# Patient Record
Sex: Female | Born: 1980 | Race: Black or African American | Hispanic: No | Marital: Married | Smoking: Never smoker
Health system: Southern US, Community
[De-identification: ages and names within clinical notes are randomized; demographics above are authoritative.]

## PROBLEM LIST (undated history)

## (undated) ENCOUNTER — Inpatient Hospital Stay (HOSPITAL_COMMUNITY): Payer: Self-pay

## (undated) ENCOUNTER — Emergency Department (HOSPITAL_COMMUNITY): Admission: EM | Payer: BC Managed Care – PPO | Source: Home / Self Care

## (undated) DIAGNOSIS — K219 Gastro-esophageal reflux disease without esophagitis: Secondary | ICD-10-CM

## (undated) DIAGNOSIS — O24419 Gestational diabetes mellitus in pregnancy, unspecified control: Secondary | ICD-10-CM

## (undated) DIAGNOSIS — M1990 Unspecified osteoarthritis, unspecified site: Secondary | ICD-10-CM

## (undated) DIAGNOSIS — IMO0002 Reserved for concepts with insufficient information to code with codable children: Secondary | ICD-10-CM

## (undated) DIAGNOSIS — D649 Anemia, unspecified: Secondary | ICD-10-CM

## (undated) HISTORY — DX: Gestational diabetes mellitus in pregnancy, unspecified control: O24.419

## (undated) HISTORY — DX: Anemia, unspecified: D64.9

## (undated) HISTORY — DX: Gastro-esophageal reflux disease without esophagitis: K21.9

## (undated) HISTORY — DX: Unspecified osteoarthritis, unspecified site: M19.90

---

## 2011-10-22 ENCOUNTER — Inpatient Hospital Stay (HOSPITAL_COMMUNITY)
Admission: AD | Admit: 2011-10-22 | Discharge: 2011-10-22 | Disposition: A | Discharge status: Home / Self Care | Payer: Medicaid Other | Source: Ambulatory Visit | Attending: Obstetrics & Gynecology | Admitting: Obstetrics & Gynecology

## 2011-10-22 ENCOUNTER — Inpatient Hospital Stay (HOSPITAL_COMMUNITY): Payer: Medicaid Other

## 2011-10-22 ENCOUNTER — Encounter (HOSPITAL_COMMUNITY): Payer: Self-pay | Admitting: *Deleted

## 2011-10-22 DIAGNOSIS — O99891 Other specified diseases and conditions complicating pregnancy: Secondary | ICD-10-CM | POA: Insufficient documentation

## 2011-10-22 DIAGNOSIS — R102 Pelvic and perineal pain: Secondary | ICD-10-CM

## 2011-10-22 DIAGNOSIS — R109 Unspecified abdominal pain: Secondary | ICD-10-CM | POA: Insufficient documentation

## 2011-10-22 DIAGNOSIS — O26899 Other specified pregnancy related conditions, unspecified trimester: Secondary | ICD-10-CM

## 2011-10-22 DIAGNOSIS — N949 Unspecified condition associated with female genital organs and menstrual cycle: Secondary | ICD-10-CM | POA: Insufficient documentation

## 2011-10-22 HISTORY — DX: Reserved for concepts with insufficient information to code with codable children: IMO0002

## 2011-10-22 LAB — URINALYSIS, ROUTINE W REFLEX MICROSCOPIC
Glucose, UA: NEGATIVE mg/dL
Hgb urine dipstick: NEGATIVE
Ketones, ur: NEGATIVE mg/dL
Protein, ur: NEGATIVE mg/dL

## 2011-10-22 LAB — WET PREP, GENITAL
Clue Cells Wet Prep HPF POC: NONE SEEN
Yeast Wet Prep HPF POC: NONE SEEN

## 2011-10-22 NOTE — Progress Notes (Signed)
?   Also can not eat- has an ulcer

## 2011-10-22 NOTE — ED Provider Notes (Signed)
History     Chief Complaint  Patient presents with  . Abdominal Pain   HPI  Pt (via interpreter) reports feeling lower pelvic pain since onset of pregnancy, however, pain felt like contractions starting this morning and was worsening; at this time pain has improved.  Pt states she had constipation, no BM for 10 days until yesterday.   Past Medical History  Diagnosis Date  . Ulcer   . Asthma     Ventolin    Past Surgical History  Procedure Date  . Cesarean section     No family history on file.  History  Substance Use Topics  . Smoking status: Never Smoker   . Smokeless tobacco: Not on file  . Alcohol Use: No    Allergies: No Known Allergies  No prescriptions prior to admission    Review of Systems  Gastrointestinal: Positive for abdominal pain.  All other systems reviewed and are negative.   Physical Exam   Blood pressure 119/55, pulse 86, temperature 99.2 F (37.3 C), temperature source Oral, resp. rate 20, height 5' 3.5" (1.613 m), weight 61.417 kg (135 lb 6.4 oz), last menstrual period 07/07/2011, unknown if currently breastfeeding.  Physical Exam  Constitutional: She is oriented to person, place, and time. She appears well-developed and well-nourished.  HENT:  Head: Normocephalic.  Neck: Normal range of motion. Neck supple.  Cardiovascular: Normal rate, regular rhythm and normal heart sounds.   Respiratory: Effort normal and breath sounds normal.  GI: Soft. There is no tenderness. There is no rebound and no guarding.       Fundal height - at umbilicus  Genitourinary: No bleeding around the vagina. Vaginal discharge (mucusy) found.       Cervix palpates short - approx 0.5 cm  Neurological: She is alert and oriented to person, place, and time.  Skin: Skin is warm and dry.    MAU Course  Procedures  Wet prep - negative GC/CT - pend UA - negative OB US - IUP of 15.4 wks; no abnormalities; cervical length 4.76  Assessment and Plan  Pelvic Pain in  Pregnancy  Plan: Keep appointment with [facility] F/U for bleeding or increasing pelvic pain  [facility] 10/22/2011, 9:25 AM

## 2011-10-22 NOTE — Progress Notes (Signed)
Pt states she feels like she is having contractions, was worse earlier this morning, feels better now.  Pt states she has been feeling this pain since beginning of pregnancy.  Pt states she has constipation, no BM for 10 days until yesterday.

## 2011-10-22 NOTE — Progress Notes (Signed)
abd and back pain.- started with preg. Has not been seen anywhere with preg.  11/27 appt at [facility]

## 2011-10-23 LAB — GC/CHLAMYDIA PROBE AMP, GENITAL: GC Probe Amp, Genital: NEGATIVE

## 2011-12-21 HISTORY — DX: Maternal care for unspecified type scar from previous cesarean delivery: O34.219

## 2012-01-03 ENCOUNTER — Inpatient Hospital Stay (HOSPITAL_COMMUNITY)
Admission: AD | Admit: 2012-01-03 | Discharge: 2012-01-03 | Disposition: A | Discharge status: Home / Self Care | Payer: Medicaid Other | Source: Ambulatory Visit | Attending: Obstetrics | Admitting: Obstetrics

## 2012-01-03 ENCOUNTER — Encounter (HOSPITAL_COMMUNITY): Payer: Self-pay | Admitting: *Deleted

## 2012-01-03 DIAGNOSIS — O99891 Other specified diseases and conditions complicating pregnancy: Secondary | ICD-10-CM | POA: Insufficient documentation

## 2012-01-03 DIAGNOSIS — R109 Unspecified abdominal pain: Secondary | ICD-10-CM | POA: Insufficient documentation

## 2012-01-03 DIAGNOSIS — M549 Dorsalgia, unspecified: Secondary | ICD-10-CM

## 2012-01-03 DIAGNOSIS — N949 Unspecified condition associated with female genital organs and menstrual cycle: Secondary | ICD-10-CM | POA: Insufficient documentation

## 2012-01-03 LAB — URINALYSIS, ROUTINE W REFLEX MICROSCOPIC
Hgb urine dipstick: NEGATIVE
Ketones, ur: 15 mg/dL — AB
Protein, ur: NEGATIVE mg/dL
Urobilinogen, UA: 0.2 mg/dL (ref 0.0–1.0)

## 2012-01-03 LAB — URINE CULTURE: Colony Count: >=100000

## 2012-01-03 MED ORDER — CYCLOBENZAPRINE HCL 5 MG PO TABS: 5.0000 mg | ORAL_TABLET | Freq: Three times a day (TID) | ORAL | Status: DC | PRN

## 2012-01-03 MED ORDER — CYCLOBENZAPRINE HCL 5 MG PO TABS: 5.0000 mg | ORAL_TABLET | Freq: Three times a day (TID) | ORAL | Status: AC | PRN

## 2012-01-03 NOTE — ED Provider Notes (Signed)
History   Patient is a 31 year old G20P1001 female who is [redacted]w[redacted]d and presents with chief complaint of pelvic pain, right leg pain, and right flank pain.  The pain began 1 day ago at 7pm.  The patient states she was on her feet doing housework from 8am-8pm 1 day ago.  She has taken no medications for pain relief but says her pain improved following a warm bath this morning. Denies nausea, vomiting, dysuria, hematuria, and blood in her stool.  Denies chest pain and shortness of breath.  Reports diarrhea yesterday afternoon.     Chief Complaint  Patient presents with  . Pelvic Pain   Pelvic Pain Associated symptoms include abdominal pain. Pertinent negatives include no chills, fever, nausea or vomiting.    OB History    Grav Para Term Preterm Abortions TAB SAB Ect Mult Living   2 1 1       1       Past Medical History  Diagnosis Date  . Ulcer   . Asthma     Ventolin    Past Surgical History  Procedure Date  . Cesarean section     No family history on file.  History  Substance Use Topics  . Smoking status: Never Smoker   . Smokeless tobacco: Not on file  . Alcohol Use: No    Allergies: No Known Allergies  Prescriptions prior to admission  Medication Sig Dispense Refill  . OVER THE COUNTER MEDICATION Patient is french and states she takes a medication for ulcers       . OVER THE COUNTER MEDICATION Patient is french and states she takes a medication for constipation       . prenatal vitamin w/FE, FA (PRENATAL 1 + 1) 27-1 MG TABS Take 1 tablet by mouth daily.          Review of Systems  Constitutional: Negative for fever and chills.  Eyes: Negative for blurred vision.  Respiratory: Negative for shortness of breath.   Cardiovascular: Negative for chest pain.  Gastrointestinal: Positive for abdominal pain. Negative for nausea, vomiting and blood in stool.  Musculoskeletal: Negative for falls.   Physical Exam   Blood pressure 109/62, pulse 96, temperature 97.4 F (36.3  C), temperature source Oral, resp. rate 18, last menstrual period 07/07/2011, SpO2 97.00%.  Physical Exam  Constitutional: She is oriented to person, place, and time. She appears well-developed and well-nourished.  HENT:  Head: Normocephalic.  Neck: Normal range of motion.  Cardiovascular: Normal rate, regular rhythm, normal heart sounds and intact distal pulses.  Exam reveals no gallop and no friction rub.   No murmur heard. Respiratory: Effort normal. No respiratory distress. She has no wheezes. She has no rales.  GI: There is tenderness (right lower quadrant).  Neurological: She is alert and oriented to person, place, and time.  Skin: Skin is warm and dry.  Psychiatric: She has a normal mood and affect. Her behavior is normal.  Genitourinary: cervix is long, thick, and high.    MAU Course: Consult with Dr. Clearance Coots  Procedures Urinalysis Urine culture Cervical exam  Assessment and Plan  Assessment 1) Musculoskeletal back pain related to physical activity 2) Round ligament pain  Plan 1) Encouraged rest, warm bath/compress, and Tylenol as needed for musculoskeletal back pain.  Prescribed Flexeril 5 mg three times per day as needed for back pain.   2) Round ligament pain educational materials given to and discussed with patient.   3) Discharge home with instructions to return  to care as needed and to follow up with Dr. Clearance Coots per her next scheduled appointment.    Jhett Fretwell 01/03/2012, 10:41 AM

## 2012-01-03 NOTE — Progress Notes (Signed)
Pt C/O pelvic pain during the night, continues this a.m.  Denies bleeding or LOF.

## 2012-01-07 NOTE — ED Provider Notes (Signed)
I have seen this patient and agree with the student's note.   Sharen Counter, CNM

## 2012-04-05 ENCOUNTER — Other Ambulatory Visit: Payer: Self-pay | Admitting: Obstetrics & Gynecology

## 2012-04-05 ENCOUNTER — Encounter (HOSPITAL_COMMUNITY): Payer: Self-pay | Admitting: Pharmacist

## 2012-04-06 ENCOUNTER — Inpatient Hospital Stay (HOSPITAL_COMMUNITY): Payer: Medicaid Other

## 2012-04-06 ENCOUNTER — Encounter (HOSPITAL_COMMUNITY): Payer: Self-pay | Admitting: *Deleted

## 2012-04-06 ENCOUNTER — Encounter (HOSPITAL_COMMUNITY): Payer: Self-pay | Admitting: Anesthesiology

## 2012-04-06 ENCOUNTER — Encounter (HOSPITAL_COMMUNITY)
Admission: RE | Disposition: A | Discharge status: Home / Self Care | Payer: Self-pay | Source: Ambulatory Visit | Attending: Obstetrics & Gynecology

## 2012-04-06 ENCOUNTER — Encounter (HOSPITAL_COMMUNITY): Payer: Self-pay | Admitting: Obstetrics & Gynecology

## 2012-04-06 ENCOUNTER — Inpatient Hospital Stay (HOSPITAL_COMMUNITY)
Admission: RE | Admit: 2012-04-06 | Discharge: 2012-04-09 | DRG: 765 | Disposition: A | Discharge status: Home / Self Care | Payer: Medicaid Other | Source: Ambulatory Visit | Attending: Obstetrics & Gynecology | Admitting: Obstetrics & Gynecology

## 2012-04-06 ENCOUNTER — Encounter (HOSPITAL_COMMUNITY): Payer: Self-pay

## 2012-04-06 DIAGNOSIS — D62 Acute posthemorrhagic anemia: Secondary | ICD-10-CM

## 2012-04-06 DIAGNOSIS — D649 Anemia, unspecified: Secondary | ICD-10-CM | POA: Diagnosis not present

## 2012-04-06 DIAGNOSIS — O409XX0 Polyhydramnios, unspecified trimester, not applicable or unspecified: Secondary | ICD-10-CM | POA: Diagnosis present

## 2012-04-06 DIAGNOSIS — O99893 Other specified diseases and conditions complicating puerperium: Secondary | ICD-10-CM | POA: Diagnosis not present

## 2012-04-06 DIAGNOSIS — Z98891 History of uterine scar from previous surgery: Secondary | ICD-10-CM

## 2012-04-06 DIAGNOSIS — O9903 Anemia complicating the puerperium: Secondary | ICD-10-CM | POA: Diagnosis not present

## 2012-04-06 DIAGNOSIS — O34219 Maternal care for unspecified type scar from previous cesarean delivery: Secondary | ICD-10-CM

## 2012-04-06 DIAGNOSIS — R0602 Shortness of breath: Secondary | ICD-10-CM | POA: Diagnosis not present

## 2012-04-06 LAB — CBC
MCH: 20.9 pg — ABNORMAL LOW (ref 26.0–34.0)
MCV: 67 fL — ABNORMAL LOW (ref 78.0–100.0)
Platelets: 209 10*3/uL (ref 150–400)
RBC: 4.88 MIL/uL (ref 3.87–5.11)
RDW: 16.9 % — ABNORMAL HIGH (ref 11.5–15.5)

## 2012-04-06 LAB — SURGICAL PCR SCREEN
MRSA, PCR: NEGATIVE
Staphylococcus aureus: NEGATIVE

## 2012-04-06 SURGERY — Surgical Case
Anesthesia: Spinal | Site: Abdomen | Wound class: Clean Contaminated

## 2012-04-06 MED ORDER — ONDANSETRON HCL 4 MG/2ML IJ SOLN
INTRAMUSCULAR | Status: AC
  Filled 2012-04-06: qty 2

## 2012-04-06 MED ORDER — ONDANSETRON HCL 4 MG/2ML IJ SOLN: 4.0000 mg | Freq: Three times a day (TID) | INTRAMUSCULAR | Status: DC | PRN

## 2012-04-06 MED ORDER — TETANUS-DIPHTH-ACELL PERTUSSIS 5-2.5-18.5 LF-MCG/0.5 IM SUSP
0.5000 mL | Freq: Once | INTRAMUSCULAR | Status: AC
  Administered 2012-04-07: 0.5 mL via INTRAMUSCULAR
  Filled 2012-04-06: qty 0.5

## 2012-04-06 MED ORDER — MUPIROCIN 2 % EX OINT
TOPICAL_OINTMENT | CUTANEOUS | Status: AC
  Administered 2012-04-06: 1 via NASAL
  Filled 2012-04-06: qty 22

## 2012-04-06 MED ORDER — CEFAZOLIN SODIUM-DEXTROSE 2-3 GM-% IV SOLR
2.0000 g | INTRAVENOUS | Status: DC
  Filled 2012-04-06: qty 50

## 2012-04-06 MED ORDER — EPHEDRINE 5 MG/ML INJ
INTRAVENOUS | Status: AC
  Filled 2012-04-06: qty 10

## 2012-04-06 MED ORDER — SENNOSIDES-DOCUSATE SODIUM 8.6-50 MG PO TABS
2.0000 | ORAL_TABLET | Freq: Every day | ORAL | Status: DC
  Administered 2012-04-07 – 2012-04-08 (×2): 2 via ORAL

## 2012-04-06 MED ORDER — HYDROMORPHONE HCL PF 1 MG/ML IJ SOLN
INTRAMUSCULAR | Status: AC
  Administered 2012-04-06: 0.5 mg via INTRAVENOUS
  Filled 2012-04-06: qty 1

## 2012-04-06 MED ORDER — ONDANSETRON HCL 4 MG PO TABS: 4.0000 mg | ORAL_TABLET | ORAL | Status: DC | PRN

## 2012-04-06 MED ORDER — LANOLIN HYDROUS EX OINT: 1.0000 "application " | TOPICAL_OINTMENT | CUTANEOUS | Status: DC | PRN

## 2012-04-06 MED ORDER — SODIUM CHLORIDE 0.9 % IJ SOLN: 3.0000 mL | INTRAMUSCULAR | Status: DC | PRN

## 2012-04-06 MED ORDER — DIBUCAINE 1 % RE OINT: 1.0000 "application " | TOPICAL_OINTMENT | RECTAL | Status: DC | PRN

## 2012-04-06 MED ORDER — KETOROLAC TROMETHAMINE 60 MG/2ML IM SOLN
60.0000 mg | Freq: Once | INTRAMUSCULAR | Status: AC | PRN
  Administered 2012-04-06: 60 mg via INTRAMUSCULAR

## 2012-04-06 MED ORDER — MEDROXYPROGESTERONE ACETATE 150 MG/ML IM SUSP: 150.0000 mg | INTRAMUSCULAR | Status: DC | PRN

## 2012-04-06 MED ORDER — NALBUPHINE HCL 10 MG/ML IJ SOLN: 5.0000 mg | INTRAMUSCULAR | Status: DC | PRN

## 2012-04-06 MED ORDER — FENTANYL CITRATE 0.05 MG/ML IJ SOLN
INTRAMUSCULAR | Status: DC | PRN
  Administered 2012-04-06: 87.5 ug via INTRAVENOUS
  Administered 2012-04-06: 12.5 ug via INTRATHECAL

## 2012-04-06 MED ORDER — OXYTOCIN 20 UNITS IN LACTATED RINGERS INFUSION - SIMPLE: 125.0000 mL/h | INTRAVENOUS | Status: AC

## 2012-04-06 MED ORDER — FENTANYL CITRATE 0.05 MG/ML IJ SOLN
INTRAMUSCULAR | Status: AC
  Filled 2012-04-06: qty 2

## 2012-04-06 MED ORDER — ONDANSETRON HCL 4 MG/2ML IJ SOLN: 4.0000 mg | INTRAMUSCULAR | Status: DC | PRN

## 2012-04-06 MED ORDER — TRIAMCINOLONE ACETONIDE 40 MG/ML IJ SUSP
40.0000 mg | Freq: Once | INTRAMUSCULAR | Status: AC
  Administered 2012-04-06: 40 mg via INTRAMUSCULAR
  Filled 2012-04-06: qty 1

## 2012-04-06 MED ORDER — ONDANSETRON HCL 4 MG/2ML IJ SOLN
INTRAMUSCULAR | Status: DC | PRN
  Administered 2012-04-06: 4 mg via INTRAVENOUS

## 2012-04-06 MED ORDER — MEPERIDINE HCL 25 MG/ML IJ SOLN: 6.2500 mg | INTRAMUSCULAR | Status: DC | PRN

## 2012-04-06 MED ORDER — DIPHENHYDRAMINE HCL 50 MG/ML IJ SOLN: 25.0000 mg | INTRAMUSCULAR | Status: DC | PRN

## 2012-04-06 MED ORDER — OXYCODONE-ACETAMINOPHEN 5-325 MG PO TABS
1.0000 | ORAL_TABLET | ORAL | Status: DC | PRN
  Administered 2012-04-08: 1 via ORAL
  Filled 2012-04-06: qty 1

## 2012-04-06 MED ORDER — BUPIVACAINE HCL (PF) 0.25 % IJ SOLN
INTRAMUSCULAR | Status: DC | PRN
  Administered 2012-04-06: 30 mL

## 2012-04-06 MED ORDER — SCOPOLAMINE 1 MG/3DAYS TD PT72
MEDICATED_PATCH | TRANSDERMAL | Status: AC
  Administered 2012-04-06: 1.5 mg via TRANSDERMAL
  Filled 2012-04-06: qty 1

## 2012-04-06 MED ORDER — DIPHENHYDRAMINE HCL 25 MG PO CAPS: 25.0000 mg | ORAL_CAPSULE | ORAL | Status: DC | PRN

## 2012-04-06 MED ORDER — MORPHINE SULFATE 0.5 MG/ML IJ SOLN
INTRAMUSCULAR | Status: AC
  Filled 2012-04-06: qty 10

## 2012-04-06 MED ORDER — WITCH HAZEL-GLYCERIN EX PADS: 1.0000 "application " | MEDICATED_PAD | CUTANEOUS | Status: DC | PRN

## 2012-04-06 MED ORDER — NALOXONE HCL 0.4 MG/ML IJ SOLN: 1.0000 ug/kg/h | INTRAMUSCULAR | Status: DC | PRN

## 2012-04-06 MED ORDER — ZOLPIDEM TARTRATE 5 MG PO TABS: 5.0000 mg | ORAL_TABLET | Freq: Every evening | ORAL | Status: DC | PRN

## 2012-04-06 MED ORDER — SIMETHICONE 80 MG PO CHEW: 80.0000 mg | CHEWABLE_TABLET | ORAL | Status: DC | PRN

## 2012-04-06 MED ORDER — SIMETHICONE 80 MG PO CHEW
80.0000 mg | CHEWABLE_TABLET | Freq: Three times a day (TID) | ORAL | Status: DC
  Administered 2012-04-07 – 2012-04-09 (×7): 80 mg via ORAL

## 2012-04-06 MED ORDER — KETOROLAC TROMETHAMINE 30 MG/ML IJ SOLN: 30.0000 mg | Freq: Four times a day (QID) | INTRAMUSCULAR | Status: AC | PRN

## 2012-04-06 MED ORDER — DIPHENHYDRAMINE HCL 25 MG PO CAPS: 25.0000 mg | ORAL_CAPSULE | Freq: Four times a day (QID) | ORAL | Status: DC | PRN

## 2012-04-06 MED ORDER — HYDROMORPHONE HCL PF 1 MG/ML IJ SOLN
0.2500 mg | INTRAMUSCULAR | Status: DC | PRN
  Administered 2012-04-06 (×3): 0.5 mg via INTRAVENOUS

## 2012-04-06 MED ORDER — PHENYLEPHRINE 40 MCG/ML (10ML) SYRINGE FOR IV PUSH (FOR BLOOD PRESSURE SUPPORT)
PREFILLED_SYRINGE | INTRAVENOUS | Status: AC
  Filled 2012-04-06: qty 10

## 2012-04-06 MED ORDER — MENTHOL 3 MG MT LOZG: 1.0000 | LOZENGE | OROMUCOSAL | Status: DC | PRN

## 2012-04-06 MED ORDER — MUPIROCIN 2 % EX OINT
TOPICAL_OINTMENT | Freq: Two times a day (BID) | CUTANEOUS | Status: DC
  Administered 2012-04-06: 1 via NASAL

## 2012-04-06 MED ORDER — LACTATED RINGERS IV SOLN
INTRAVENOUS | Status: DC
  Administered 2012-04-06 – 2012-04-07 (×2): via INTRAVENOUS

## 2012-04-06 MED ORDER — SCOPOLAMINE 1 MG/3DAYS TD PT72
1.0000 | MEDICATED_PATCH | Freq: Once | TRANSDERMAL | Status: DC
  Administered 2012-04-06: 1.5 mg via TRANSDERMAL

## 2012-04-06 MED ORDER — EPHEDRINE SULFATE 50 MG/ML IJ SOLN
INTRAMUSCULAR | Status: DC | PRN
  Administered 2012-04-06: 10 mg via INTRAVENOUS

## 2012-04-06 MED ORDER — BACITRACIN-NEOMYCIN-POLYMYXIN 400-5-5000 EX OINT
TOPICAL_OINTMENT | CUTANEOUS | Status: AC
  Filled 2012-04-06: qty 1

## 2012-04-06 MED ORDER — OXYTOCIN 20 UNITS IN LACTATED RINGERS INFUSION - SIMPLE
INTRAVENOUS | Status: DC | PRN
  Administered 2012-04-06 (×2): 20 [IU] via INTRAVENOUS

## 2012-04-06 MED ORDER — PRENATAL MULTIVITAMIN CH
1.0000 | ORAL_TABLET | Freq: Every day | ORAL | Status: DC
  Administered 2012-04-07 – 2012-04-09 (×3): 1 via ORAL
  Filled 2012-04-06 (×3): qty 1

## 2012-04-06 MED ORDER — KETOROLAC TROMETHAMINE 60 MG/2ML IM SOLN
INTRAMUSCULAR | Status: AC
  Administered 2012-04-06: 60 mg via INTRAMUSCULAR
  Filled 2012-04-06: qty 2

## 2012-04-06 MED ORDER — CEFAZOLIN SODIUM 1-5 GM-% IV SOLN
INTRAVENOUS | Status: DC | PRN
  Administered 2012-04-06: 2 g via INTRAVENOUS

## 2012-04-06 MED ORDER — OXYTOCIN 10 UNIT/ML IJ SOLN
INTRAMUSCULAR | Status: AC
  Filled 2012-04-06: qty 2

## 2012-04-06 MED ORDER — OXYTOCIN 10 UNIT/ML IJ SOLN
INTRAMUSCULAR | Status: AC
  Filled 2012-04-06: qty 1

## 2012-04-06 MED ORDER — DIPHENHYDRAMINE HCL 50 MG/ML IJ SOLN: 12.5000 mg | INTRAMUSCULAR | Status: DC | PRN

## 2012-04-06 MED ORDER — NALOXONE HCL 0.4 MG/ML IJ SOLN: 0.4000 mg | INTRAMUSCULAR | Status: DC | PRN

## 2012-04-06 MED ORDER — LACTATED RINGERS IV SOLN
INTRAVENOUS | Status: DC
  Administered 2012-04-06 (×2): via INTRAVENOUS

## 2012-04-06 MED ORDER — KETOROLAC TROMETHAMINE 30 MG/ML IJ SOLN: 15.0000 mg | Freq: Once | INTRAMUSCULAR | Status: DC | PRN

## 2012-04-06 MED ORDER — MORPHINE SULFATE (PF) 0.5 MG/ML IJ SOLN
INTRAMUSCULAR | Status: DC | PRN
  Administered 2012-04-06: .1 mg via INTRATHECAL

## 2012-04-06 MED ORDER — BUPIVACAINE HCL (PF) 0.25 % IJ SOLN
INTRAMUSCULAR | Status: AC
  Filled 2012-04-06: qty 30

## 2012-04-06 MED ORDER — PROMETHAZINE HCL 25 MG/ML IJ SOLN
6.2500 mg | INTRAMUSCULAR | Status: DC | PRN
Start: 2012-04-06 — End: 2012-04-06

## 2012-04-06 MED ORDER — PHENYLEPHRINE HCL 10 MG/ML IJ SOLN
INTRAMUSCULAR | Status: DC | PRN
  Administered 2012-04-06 (×2): 80 ug via INTRAVENOUS

## 2012-04-06 MED ORDER — IBUPROFEN 600 MG PO TABS
600.0000 mg | ORAL_TABLET | Freq: Four times a day (QID) | ORAL | Status: DC
  Administered 2012-04-07 – 2012-04-09 (×9): 600 mg via ORAL
  Filled 2012-04-06 (×10): qty 1

## 2012-04-06 SURGICAL SUPPLY — 42 items
BENZOIN TINCTURE PRP APPL 2/3 (GAUZE/BANDAGES/DRESSINGS) ×2 IMPLANT
CANISTER WOUND CARE 500ML ATS (WOUND CARE) IMPLANT
CHLORAPREP W/TINT 26ML (MISCELLANEOUS) ×2 IMPLANT
CLOTH BEACON ORANGE TIMEOUT ST (SAFETY) ×2 IMPLANT
CONTAINER PREFILL 10% NBF 15ML (MISCELLANEOUS) IMPLANT
DRESSING TELFA 8X3 (GAUZE/BANDAGES/DRESSINGS) ×2 IMPLANT
DRSG VAC ATS LRG SENSATRAC (GAUZE/BANDAGES/DRESSINGS) IMPLANT
DRSG VAC ATS MED SENSATRAC (GAUZE/BANDAGES/DRESSINGS) IMPLANT
DRSG VAC ATS SM SENSATRAC (GAUZE/BANDAGES/DRESSINGS) IMPLANT
ELECT REM PT RETURN 9FT ADLT (ELECTROSURGICAL) ×2
ELECTRODE REM PT RTRN 9FT ADLT (ELECTROSURGICAL) ×1 IMPLANT
EXTRACTOR VACUUM M CUP 4 TUBE (SUCTIONS) IMPLANT
GAUZE SPONGE 4X4 12PLY STRL LF (GAUZE/BANDAGES/DRESSINGS) ×4 IMPLANT
GLOVE BIO SURGEON STRL SZ 6.5 (GLOVE) ×4 IMPLANT
GOWN PREVENTION PLUS LG XLONG (DISPOSABLE) ×6 IMPLANT
KIT ABG SYR 3ML LUER SLIP (SYRINGE) IMPLANT
NEEDLE HYPO 25X5/8 SAFETYGLIDE (NEEDLE) ×2 IMPLANT
NS IRRIG 1000ML POUR BTL (IV SOLUTION) ×2 IMPLANT
PACK C SECTION WH (CUSTOM PROCEDURE TRAY) ×2 IMPLANT
PAD ABD 7.5X8 STRL (GAUZE/BANDAGES/DRESSINGS) ×2 IMPLANT
RTRCTR C-SECT PINK 25CM LRG (MISCELLANEOUS) IMPLANT
RTRCTR C-SECT PINK 34CM XLRG (MISCELLANEOUS) IMPLANT
SLEEVE SCD COMPRESS KNEE MED (MISCELLANEOUS) IMPLANT
SPONGE GAUZE 4X4 12PLY (GAUZE/BANDAGES/DRESSINGS) ×2 IMPLANT
STAPLER VISISTAT 35W (STAPLE) IMPLANT
STRIP CLOSURE SKIN 1/2X4 (GAUZE/BANDAGES/DRESSINGS) ×2 IMPLANT
SUT MNCRL 0 VIOLET CTX 36 (SUTURE) ×2 IMPLANT
SUT MNCRL AB 3-0 PS2 27 (SUTURE) IMPLANT
SUT MONOCRYL 0 CTX 36 (SUTURE) ×2
SUT PDS AB 0 CTX 36 PDP370T (SUTURE) ×2 IMPLANT
SUT PLAIN 0 NONE (SUTURE) IMPLANT
SUT VIC AB 0 CT1 27 (SUTURE)
SUT VIC AB 0 CT1 27XBRD ANBCTR (SUTURE) IMPLANT
SUT VIC AB 2-0 CT1 (SUTURE) IMPLANT
SUT VIC AB 2-0 CT1 27 (SUTURE) ×1
SUT VIC AB 2-0 CT1 TAPERPNT 27 (SUTURE) ×1 IMPLANT
SUT VIC AB 2-0 SH 27 (SUTURE)
SUT VIC AB 2-0 SH 27XBRD (SUTURE) IMPLANT
TAPE CLOTH SURG 4X10 WHT LF (GAUZE/BANDAGES/DRESSINGS) ×2 IMPLANT
TOWEL OR 17X24 6PK STRL BLUE (TOWEL DISPOSABLE) ×4 IMPLANT
TRAY FOLEY CATH 14FR (SET/KITS/TRAYS/PACK) ×2 IMPLANT
WATER STERILE IRR 1000ML POUR (IV SOLUTION) IMPLANT

## 2012-04-06 NOTE — H&P (Signed)
Teresa [name] is [name] 31 y.o. female presenting for [name] repeat C/D. Maternal Medical History:  Reason for admission: For [name] repeat C/D.  Contractions: Onset was more than 2 days ago.   Frequency: irregular.   Perceived severity is mild.    Fetal activity: Perceived fetal activity is normal.    Prenatal complications: Polyhydramnios.     OB History    Grav Para Term Preterm Abortions TAB SAB Ect Mult Living   2 1 1       1      Past Medical History  Diagnosis Date  . Ulcer   . Asthma     Ventolin   Past Surgical History  Procedure Date  . Cesarean section    Family History: family history is not on file. Social History:  reports that she has never smoked. She does not have any smokeless tobacco history on file. She reports that she does not drink alcohol or use illicit drugs.  Review of Systems  Constitutional: Negative for fever.  Eyes: Negative for blurred vision.  Respiratory: Negative for shortness of breath.   Gastrointestinal: Negative for vomiting.  Skin: Negative for rash.  Neurological: Negative for headaches.      Last menstrual period 07/07/2011. Maternal Exam:  Uterine Assessment: Contraction strength is mild.  Contraction frequency is irregular.   Abdomen: Patient reports no abdominal tenderness. Surgical scars: low transverse.   Introitus: not evaluated.     Fetal Exam Fetal Monitor Review: Variability: moderate (6-25 bpm).   Pattern: no accelerations and no decelerations.    Fetal State Assessment: Category I - tracings are normal.     Physical Exam  Constitutional: She appears well-developed.  HENT:  Head: Normocephalic.  Neck: Neck supple. No thyromegaly present.  Cardiovascular: Normal rate and regular rhythm.   Respiratory: Breath sounds normal.  GI: Soft. Bowel sounds are normal.  Skin: No rash noted.    Prenatal labs: ABO, Rh:   Antibody:   Rubella:   RPR:    HBsAg:    HIV:    GBS:     Assessment/Plan: 31 y.o. P1 w/an IUP  @ [redacted]w[redacted]d.  Polyhydramnios.  H/O previous C/D--undocumented scar.  Repeat C/D   JACKSON-MOORE,Teresa [name] 04/06/2012, 9:35 AM

## 2012-04-06 NOTE — Anesthesia Preprocedure Evaluation (Signed)
Anesthesia Evaluation  Patient identified by MRN, date of birth, ID band Patient awake    Reviewed: Allergy & Precautions, H&P , NPO status , Patient's Chart, lab work & pertinent test results  Airway Mallampati: I TM Distance: >3 FB Neck ROM: full    Dental No notable dental hx.    Pulmonary    Pulmonary exam normal       Cardiovascular negative cardio ROS      Neuro/Psych negative neurological ROS  negative psych ROS   GI/Hepatic negative GI ROS, Neg liver ROS,   Endo/Other  negative endocrine ROS  Renal/GU negative Renal ROS  negative genitourinary   Musculoskeletal negative musculoskeletal ROS (+)   Abdominal Normal abdominal exam  (+)   Peds negative pediatric ROS (+)  Hematology negative hematology ROS (+)   Anesthesia Other Findings   Reproductive/Obstetrics (+) Pregnancy                           Anesthesia Physical Anesthesia Plan  ASA: II  Anesthesia Plan: Spinal   Post-op Pain Management:    Induction:   Airway Management Planned:   Additional Equipment:   Intra-op Plan:   Post-operative Plan:   Informed Consent: I have reviewed the patients History and Physical, chart, labs and discussed the procedure including the risks, benefits and alternatives for the proposed anesthesia with the patient or authorized representative who has indicated his/her understanding and acceptance.     Plan Discussed with: CRNA and Surgeon  Anesthesia Plan Comments:         Anesthesia Quick Evaluation  

## 2012-04-06 NOTE — Transfer of Care (Signed)
Immediate Anesthesia Transfer of Care Note  Patient: Teresa [name]  Procedure(s) Performed: Procedure(s) (LRB): CESAREAN SECTION (N/A)  Patient Location: PACU  Anesthesia Type: Spinal  Level of Consciousness: awake, alert , oriented and patient cooperative  Airway & Oxygen Therapy: Patient Spontanous Breathing  Post-op Assessment: Report given to PACU RN and Post -op Vital signs reviewed and stable  Post vital signs: Reviewed and stable  Complications: No apparent anesthesia complications

## 2012-04-06 NOTE — OR Nursing (Signed)
Pt speaks [place], with fairly good [place]. Husband speaks very good English and will remain in PACU as translater per pt requested if needed.

## 2012-04-06 NOTE — Anesthesia Postprocedure Evaluation (Signed)
Anesthesia Post Note  Patient: Teresa [name]  Procedure(s) Performed: Procedure(s) (LRB): CESAREAN SECTION (N/A)  Anesthesia type: Spinal  Patient location: PACU  Post pain: Pain level controlled  Post assessment: Post-op Vital signs reviewed  Last Vitals:  Filed Vitals:   04/06/12 1323  BP: 107/63  Pulse: 100  Temp: 37.1 C  Resp: 18    Post vital signs: Reviewed  Level of consciousness: awake  Complications: No apparent anesthesia complications

## 2012-04-06 NOTE — Op Note (Signed)
Cesarean Section Procedure Note   Teresa [name]   04/06/2012  Indications: Scheduled Proceedure/Maternal Request   Pre-operative Diagnosis: previous cesarean section polyhydramnios, hypertrophic scar  Post-operative Diagnosis: Same   Surgeon: Antionette Char A  Assistants: Coral Ceo A  Anesthesia: spinal  Procedure Details:  The patient was seen in the Holding Room. The risks, benefits, complications, treatment options, and expected outcomes were discussed with the patient. The patient concurred with the proposed plan, giving informed consent. The patient was identified as Teresa [name] and the procedure verified as C-Section Delivery. A Time Out was held and the above information confirmed.  After induction of anesthesia, the patient was draped and prepped in the usual sterile manner. A transverse incision was made and carried down through the subcutaneous tissue to the fascia. The fascial incision was made and extended transversely. The fascia was separated from the underlying rectus tissue superiorly and inferiorly. The peritoneum was identified and entered. The peritoneal incision was extended longitudinally. The utero-vesical peritoneal reflection was incised transversely and the bladder flap was bluntly freed from the lower uterine segment. A low transverse uterine incision was made. Delivered from cephalic presentation was a  living newborn  Infant.  A cord ph was not sent. The umbilical cord was clamped and cut cord. A sample was obtained for evaluation. The placenta was removed Intact and appeared normal.  The uterine incision was closed with running locked sutures of 1-0 Monocryl. A second imbricating layer of the same suture was placed.  Hemostasis was observed. The paracolic gutters were irrigated. The fascia was then reapproximated with running sutures of 1-0 PDS.  The skin was infiltrated with a dilute Kenalog/ 0.25% Marcaine solution.   The subcuticular closure was  performed using 3-0 Monocryl.  Instrument, sponge, and needle counts were correct prior the abdominal closure and were correct at the conclusion of the case.    Findings:  Hypertrophic scar   Estimated Blood Loss: 700 ml  Total IV Fluids:   Urine Output: per Anesthesiology  Specimens: Placenta  Complications: no complications  Disposition: PACU - hemodynamically stable.  Maternal Condition: stable   Baby condition / location:  nursery-stable    Signed: Surgeon(s): Antionette Char, MD Brock Bad, MD

## 2012-04-06 NOTE — Consult Note (Signed)
Neonatology Note:   Attendance at C-section:    I was asked to attend this repeat C/S at term. The mother is a G2P1 B pos, GBS neg with polyhydramnios. ROM at delivery, fluid clear. Infant vigorous with good spontaneous cry and tone. Needed only bulb suctioning. Ap 9/9. Lungs clear to ausc in DR. To CN to care of Pediatrician.   Deatra James, MD

## 2012-04-07 DIAGNOSIS — D62 Acute posthemorrhagic anemia: Secondary | ICD-10-CM | POA: Diagnosis not present

## 2012-04-07 LAB — CBC
Hemoglobin: 8 g/dL — ABNORMAL LOW (ref 12.0–15.0)
MCHC: 31.3 g/dL (ref 30.0–36.0)
RBC: 3.83 MIL/uL — ABNORMAL LOW (ref 3.87–5.11)
WBC: 9.6 10*3/uL (ref 4.0–10.5)

## 2012-04-07 NOTE — Progress Notes (Signed)
Patient ID: Teresa [name], female   DOB: [birth year], 31 y.o.   MRN: [id number] Subjective: POD# 1 s/p Cesarean Delivery.  Indications: elective repeat  RH status/Rubella reviewed. Feeding: unknown Patient reports tolerating PO.  Denies HA/SOB/C/P/N/V/dizziness.   Breast symptoms: no.  She reports vaginal bleeding as normal, without clots.  She is ambulating, urinating without difficulty.     Objective: Vital signs in last 24 hours: BP 102/65  Pulse 76  Temp(Src) 98 F (36.7 C) (Oral)  Resp 20  Ht 5\' 3"  (1.6 m)  Wt 61.236 kg (135 lb)  BMI 23.91 kg/m2  SpO2 100%  LMP 07/07/2011  Breastfeeding? Unknown       Physical Exam:  General: alert CV: Regular rate and rhythm Resp: clear Abdomen: soft, nontender, normal bowel sounds Lochia: minimal Uterine Fundus: firm, below umbilicus, nontender Incision: dressing C/D Ext: extremities normal, atraumatic, no cyanosis or edema    Basename 04/07/12 0520 04/06/12 1331  HGB 8.0* 10.2*  HCT 25.6* 32.7*      Assessment/Plan: 31 y.o.  status post Cesarean section. POD# 1.   Doing well, stable.    Anemia           Advance diet as tolerated Start po pain meds D/C foley  HLIV  Ambulate IS Routine post-op care  JACKSON-MOORE,[name] 04/07/2012, 8:47 AM

## 2012-04-07 NOTE — Anesthesia Postprocedure Evaluation (Signed)
  Anesthesia Post-op Note  Patient: Teresa [name]  Procedure(s) Performed: Procedure(s) (LRB): CESAREAN SECTION (N/A)  Patient Location: PACU and Mother/Baby  Anesthesia Type: Spinal  Level of Consciousness: awake, alert  and oriented  Airway and Oxygen Therapy: Patient Spontanous Breathing  Post-op Pain: mild  Post-op Assessment: Post-op Vital signs reviewed  Post-op Vital Signs: Reviewed and stable  Complications: No apparent anesthesia complications

## 2012-04-07 NOTE — Progress Notes (Signed)
UR Chart review completed.  

## 2012-04-08 ENCOUNTER — Inpatient Hospital Stay (HOSPITAL_COMMUNITY): Payer: Medicaid Other

## 2012-04-08 NOTE — Significant Event (Signed)
Rapid Response Event Note  Overview: Time Called: 2318 Arrival Time: 2322 Event Type: Respiratory  Initial Focused Assessment:Respiratory and Heart sounds   Interventions: Advised pt to slow respirations, Primary RN calling MD   Event Summary:PT O2 sat 100% on room air; lungs clear bilaterally throughout all fields; pt states breathing is some easier than when initially began; Dr Gaynell Face notified and ordered CX-Ray and Chest CT. Will recheck on pt and results when available. Advised Primary RN to call if needs further assistance or pt distress. Name of Physician Notified: Dr Gaynell Face at [phone]    at    Outcome: Stayed in room and stabalized  Event End Time: 2342  Chuck Hint

## 2012-04-09 ENCOUNTER — Inpatient Hospital Stay (HOSPITAL_COMMUNITY): Payer: Medicaid Other

## 2012-04-09 MED ORDER — IOHEXOL 300 MG/ML  SOLN
100.0000 mL | Freq: Once | INTRAMUSCULAR | Status: AC | PRN
  Administered 2012-04-09: 100 mL via INTRAVENOUS

## 2012-04-09 NOTE — Progress Notes (Signed)
Called in to patients room at 4/2-/2013 at 2310 by her husband stating "My wife is having some difficulty breathing". Patient sitting on the side of the bed breathing heavily. Patients husband states she was just on the side of the bed feeding the baby and became short of breath. Vitals signs WNL and OS sats 100% on RA. Noted patient using an inhaler from home (Ventolin). Notified Rapid Response and MD on call.

## 2012-04-09 NOTE — Progress Notes (Signed)
Patient ID: Teresa [name], female   DOB: [birth year], 31 y.o.   MRN: [id number] Postop day 3 Vital signs normal Lungs clear Heart regular rhythm no murmurs no gallops Pulse regular Incision clean and dry Legs negative Her in in the night the patient complained of shortness of breath her PO2 was 100 chest x-ray was done and this was negative she also had a spiral CT performed and this was negative Dr. Andria Meuse a radiologist and said that there was some artifact but sometimes could represent a dissecting aortic aneurysm [place] since the patient was asymptomatic no further testing was needed this a.m. Patient feels fine she has no shortness of breath she been discharged home on Percocet and

## 2012-04-09 NOTE — Discharge Summary (Signed)
Obstetric Discharge Summary Reason for Admission: cesarean section Prenatal Procedures: none Intrapartum Procedures: cesarean: low cervical, transverse Postpartum Procedures: none Complications-Operative and Postpartum: none Hemoglobin  Date Value Range Status  04/07/2012 8.0* 12.0-15.0 (g/dL) Final     DELTA CHECK NOTED     REPEATED TO VERIFY     HCT  Date Value Range Status  04/07/2012 25.6* 36.0-46.0 (%) Final    Physical Exam:  General: alert Lochia: appropriate Uterine Fundus: firm Incision: healing well DVT Evaluation: No evidence of DVT seen on physical exam.  Discharge Diagnoses: Term Pregnancy-delivered  Discharge Information: Date: 04/09/2012 Activity: pelvic rest Diet: routine Medications: Percocet Condition: stable Instructions: refer to practice specific booklet Discharge to: home Follow-up Information    Follow up with Antionette Char A, MD. Call in 6 weeks.   Contact information:   [address], [address] [place] [address] [phone]          Newborn Data: Live born female  Birth Weight: 7 lb 5.5 oz (3330 g) APGAR: 9, 9  Home with mother.  Custer Pimenta A 04/09/2012, 6:06 AM

## 2012-04-09 NOTE — Progress Notes (Signed)
Reassess patient at 2345 and patient in bed resting with unlabored breathing. Patient States "I feel a lot better". Will continue to monitor patient as needed.

## 2012-04-09 NOTE — Discharge Instructions (Signed)
Discharge instructions   You can wash your hair  Shower  Eat what you want  Drink what you want  See me in 6 weeks  Your ankles are going to swell more in the next 2 weeks than when pregnant  No sex for 6 weeks   Jebidiah Baggerly A, MD 04/09/2012

## 2012-04-10 ENCOUNTER — Encounter (HOSPITAL_COMMUNITY): Payer: Self-pay | Admitting: Obstetrics & Gynecology

## 2012-04-10 MED ORDER — BUPIVACAINE IN DEXTROSE 0.75-8.25 % IT SOLN
INTRATHECAL | Status: DC | PRN
  Administered 2012-04-06: 1.4 mg via INTRATHECAL

## 2012-04-10 NOTE — Anesthesia Procedure Notes (Signed)
Spinal  Patient location during procedure: OR Start time: 04/06/2012 3:45 PM End time: 04/06/2012 3:50 PM Staffing Anesthesiologist: Sandrea Hughs Preanesthetic Checklist Completed: patient identified, site marked, surgical consent, pre-op evaluation, timeout performed, IV checked, risks and benefits discussed and monitors and equipment checked Spinal Block Patient position: sitting Prep: DuraPrep Patient monitoring: heart rate, cardiac monitor, continuous pulse ox and blood pressure Approach: midline Location: L3-4 Injection technique: single-shot Needle Needle type: Sprotte  Needle gauge: 24 G Needle length: 9 cm Needle insertion depth: 6 cm Assessment Sensory level: T4

## 2012-05-12 ENCOUNTER — Encounter: Payer: Self-pay | Admitting: Gastroenterology

## 2012-05-19 ENCOUNTER — Ambulatory Visit: Payer: Medicaid Other | Admitting: Gastroenterology

## 2012-05-25 ENCOUNTER — Ambulatory Visit: Payer: Medicaid Other | Admitting: Physical Therapy

## 2012-05-25 ENCOUNTER — Ambulatory Visit: Payer: Medicaid Other | Attending: Obstetrics & Gynecology | Admitting: Physical Therapy

## 2012-05-25 DIAGNOSIS — IMO0001 Reserved for inherently not codable concepts without codable children: Secondary | ICD-10-CM | POA: Insufficient documentation

## 2012-05-25 DIAGNOSIS — M25559 Pain in unspecified hip: Secondary | ICD-10-CM | POA: Insufficient documentation

## 2012-05-25 DIAGNOSIS — M25659 Stiffness of unspecified hip, not elsewhere classified: Secondary | ICD-10-CM | POA: Insufficient documentation

## 2012-05-29 ENCOUNTER — Ambulatory Visit: Payer: Self-pay | Admitting: Physical Therapy

## 2012-06-01 ENCOUNTER — Encounter: Payer: Self-pay | Admitting: Physical Therapy

## 2012-06-01 ENCOUNTER — Ambulatory Visit: Payer: Medicaid Other | Admitting: Physical Therapy

## 2012-06-09 ENCOUNTER — Encounter: Payer: Self-pay | Admitting: Physical Therapy

## 2012-06-09 ENCOUNTER — Ambulatory Visit: Payer: Medicaid Other | Admitting: Physical Therapy

## 2012-06-15 ENCOUNTER — Ambulatory Visit: Payer: Medicaid Other | Admitting: Physical Therapy

## 2012-06-15 ENCOUNTER — Encounter: Payer: Self-pay | Admitting: Physical Therapy

## 2012-06-16 ENCOUNTER — Ambulatory Visit (INDEPENDENT_AMBULATORY_CARE_PROVIDER_SITE_OTHER): Payer: Medicaid Other | Admitting: Gastroenterology

## 2012-06-16 ENCOUNTER — Encounter: Payer: Self-pay | Admitting: Gastroenterology

## 2012-06-16 VITALS — BP 110/70 | HR 88 | Ht 65.0 in | Wt 142.8 lb

## 2012-06-16 DIAGNOSIS — K3189 Other diseases of stomach and duodenum: Secondary | ICD-10-CM

## 2012-06-16 DIAGNOSIS — K5900 Constipation, unspecified: Secondary | ICD-10-CM

## 2012-06-16 DIAGNOSIS — R1310 Dysphagia, unspecified: Secondary | ICD-10-CM

## 2012-06-16 DIAGNOSIS — K219 Gastro-esophageal reflux disease without esophagitis: Secondary | ICD-10-CM

## 2012-06-16 DIAGNOSIS — R1013 Epigastric pain: Secondary | ICD-10-CM

## 2012-06-16 MED ORDER — GLYCOPYRROLATE 2 MG PO TABS: 2.0000 mg | ORAL_TABLET | Freq: Two times a day (BID) | ORAL | Status: DC

## 2012-06-16 MED ORDER — DEXLANSOPRAZOLE 60 MG PO CPDR: 60.0000 mg | DELAYED_RELEASE_CAPSULE | Freq: Every day | ORAL | Status: DC

## 2012-06-16 MED ORDER — POLYETHYLENE GLYCOL 3350 17 GM/SCOOP PO POWD: 17.0000 g | Freq: Every day | ORAL | Status: AC

## 2012-06-16 NOTE — Assessment & Plan Note (Signed)
Symptoms seem to be poorly controlled with Prilosec.  Recommendations #1 switch to dexilant 60 mg daily

## 2012-06-16 NOTE — Patient Instructions (Addendum)
You will need to contact the office and schedule your Endoscopy that was recommended to you by Dr Arlyce Dice when your assistance through [facility] becomes available We have given you dexilant samples today Take Miralax every 2-3 days as needed

## 2012-06-16 NOTE — Assessment & Plan Note (Signed)
Symptoms could be due to ulcer or nonulcer dyspepsia. H. pylori infection is also a consideration.  Recommendations #1 upper endoscopy #2 switch to dexilant 60 mg daily

## 2012-06-16 NOTE — Progress Notes (Signed)
History of Present Illness:Mrs. Teresa [name] is a 31 year old female from [place] self-referred for evaluation of abdominal pain and constipation. History is provided by her husband who is translating for her. For several months she's been complaining of severe burning chest and upper abdominal pain despite taking Prilosec. She seems to have dysphagia as well. She's on no gastric irritants including nonsteroidals. She has postprandial upper abdominal discomfort with bloating. She also suffers from constipation and may go up to a week without a bowel movement. She denies rectal bleeding.    Past Medical History  Diagnosis Date  . Ulcer   . Asthma     Ventolin  . GERD (gastroesophageal reflux disease)    Past Surgical History  Procedure Date  . Cesarean section   . Cesarean section 04/06/2012    Procedure: CESAREAN SECTION;  Surgeon: Antionette Char, MD;  Location: [facility];  Service: Gynecology;  Laterality: N/A;   family history includes Diabetes in her mother.  There is no history of Colon cancer. Current Outpatient Prescriptions  Medication Sig Dispense Refill  . omeprazole (PRILOSEC) 40 MG capsule Take 40 mg by mouth daily.       Allergies as of 06/16/2012  . (No Known Allergies)    reports that she has never smoked. She does not have any smokeless tobacco history on file. She reports that she does not drink alcohol or use illicit drugs.     Review of Systems: Pertinent positive and negative review of systems were noted in the above HPI section. All other review of systems were otherwise negative.  Vital signs were reviewed in today's medical record Physical Exam: General: Well developed , well nourished, no acute distress Head: Normocephalic and atraumatic Eyes:  sclerae anicteric, EOMI Ears: Normal auditory acuity Mouth: No deformity or lesions Neck: Supple, no masses or thyromegaly Lungs: Clear throughout to auscultation Heart: Regular rate and rhythm; no murmurs, rubs or  bruits Abdomen: Soft,and non distended. No masses, hepatosplenomegaly or hernias noted. Normal Bowel sounds; there is minimal tenderness in the midepigastrium Rectal:deferred Musculoskeletal: Symmetrical with no gross deformities  Skin: No lesions on visible extremities Pulses:  Normal pulses noted Extremities: No clubbing, cyanosis, edema or deformities noted Neurological: Alert oriented x 4, grossly nonfocal Cervical Nodes:  No significant cervical adenopathy Inguinal Nodes: No significant inguinal adenopathy Psychological:  Alert and cooperative. Normal mood and affect

## 2012-06-16 NOTE — Assessment & Plan Note (Addendum)
Possible esophageal stricture  Recommendations #1 upper endoscopy with dilatation as indicated

## 2012-06-16 NOTE — Assessment & Plan Note (Signed)
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  Recommendations #1 MiraLax every 2-3 days as needed

## 2012-07-16 ENCOUNTER — Emergency Department (HOSPITAL_COMMUNITY)
Admission: EM | Admit: 2012-07-16 | Discharge: 2012-07-16 | Discharge status: Against Medical Advice | Payer: Medicaid Other | Attending: Emergency Medicine | Admitting: Emergency Medicine

## 2012-07-16 ENCOUNTER — Encounter (HOSPITAL_COMMUNITY): Payer: Self-pay | Admitting: *Deleted

## 2012-07-16 DIAGNOSIS — R109 Unspecified abdominal pain: Secondary | ICD-10-CM | POA: Insufficient documentation

## 2012-07-16 DIAGNOSIS — R112 Nausea with vomiting, unspecified: Secondary | ICD-10-CM | POA: Insufficient documentation

## 2012-07-16 LAB — COMPREHENSIVE METABOLIC PANEL
AST: 17 U/L (ref 0–37)
BUN: 8 mg/dL (ref 6–23)
CO2: 22 mEq/L (ref 19–32)
Calcium: 9.7 mg/dL (ref 8.4–10.5)
Chloride: 103 mEq/L (ref 96–112)
Creatinine, Ser: 0.65 mg/dL (ref 0.50–1.10)
GFR calc Af Amer: >90 mL/min (ref >90)
GFR calc non Af Amer: >90 mL/min (ref >90)
Glucose, Bld: 93 mg/dL (ref 70–99)
Total Bilirubin: 0.5 mg/dL (ref 0.3–1.2)

## 2012-07-16 LAB — CBC
HCT: 39.7 % (ref 36.0–46.0)
MCH: 21.2 pg — ABNORMAL LOW (ref 26.0–34.0)
MCV: 63.4 fL — ABNORMAL LOW (ref 78.0–100.0)
Platelets: 249 10*3/uL (ref 150–400)
RBC: 6.26 MIL/uL — ABNORMAL HIGH (ref 3.87–5.11)
WBC: 8.2 10*3/uL (ref 4.0–10.5)

## 2012-07-16 NOTE — ED Notes (Signed)
Reports being diagnosed with gastric ulcer, having abd pain since yesterday, more severe today with n/v.

## 2012-08-16 ENCOUNTER — Ambulatory Visit (INDEPENDENT_AMBULATORY_CARE_PROVIDER_SITE_OTHER): Payer: Medicaid Other | Admitting: Gastroenterology

## 2012-08-16 ENCOUNTER — Encounter: Payer: Self-pay | Admitting: Gastroenterology

## 2012-08-16 VITALS — BP 110/74 | HR 88 | Ht 65.0 in | Wt 143.0 lb

## 2012-08-16 DIAGNOSIS — R1013 Epigastric pain: Secondary | ICD-10-CM

## 2012-08-16 DIAGNOSIS — K3189 Other diseases of stomach and duodenum: Secondary | ICD-10-CM

## 2012-08-16 NOTE — Assessment & Plan Note (Signed)
Patient has persistent dyspepsia which did not clearly respond to dexilant. Ulcer in or nonulcer dyspepsia are considerations. Pain from perhaps cholelithiasis is also a possibility.  Recommendations #1 upper endoscopy; if unremarkable I would consider a trial of anticholinergics. Failing that, I will obtain an abdominal ultrasound

## 2012-08-16 NOTE — Patient Instructions (Addendum)
Upper GI Endoscopy Upper GI endoscopy means using a flexible scope to look at the esophagus, stomach, and upper small bowel. This is done to make a diagnosis in people with heartburn, abdominal pain, or abnormal bleeding. Sometimes an endoscope is needed to remove foreign bodies or food that become stuck in the esophagus; it can also be used to take biopsy samples. For the best results, do not eat or drink for 8 hours before having your upper endoscopy.  To perform the endoscopy, you will probably be sedated and your throat will be numbed with a special spray. The endoscope is then slowly passed down your throat (this will not interfere with your breathing). An endoscopy exam takes 15 to 30 minutes to complete and there is no real pain. Patients rarely remember much about the procedure. The results of the test may take several days if a biopsy or other test is taken.  You may have a sore throat after an endoscopy exam. Serious complications are very rare. Stick to liquids and soft foods until your pain is better. Do not drive a car or operate any dangerous equipment for at least 24 hours after being sedated. SEEK IMMEDIATE MEDICAL CARE IF:   You have severe throat pain.   You have shortness of breath.   You have bleeding problems.   You have a fever.   You have difficulty recovering from your sedation.  Document Released: 01/13/2005 Document Revised: 11/25/2011 Document Reviewed: 12/08/2008 [employer] Patient Information 2012 [employer]. 

## 2012-08-16 NOTE — Progress Notes (Signed)
History of Present Illness:  Mrs. Teresa [name] continues to complain of epigastric pain. She has postprandial upper abdominal pain. This is despite taking dexilant. At her last visit she was complaining of dysphagia but this has resolved. Her bowels are now moving regularly. She thinks  fatty foods worsen her symptoms. History is somewhat limited because of a language barrier. Her husband translates for her.  She was seen in the ED last month because of abdominal pain. CBC and LFTs were normal with the exception of a low MCV.    Review of Systems: Pertinent positive and negative review of systems were noted in the above HPI section. All other review of systems were otherwise negative.    Current Medications, Allergies, Past Medical History, Past Surgical History, Family History and Social History were reviewed in [employer] electronic medical record  Vital signs were reviewed in today's medical record. Physical Exam: General: Well developed , well nourished, no acute distress Alert and cooperative. Normal mood and affect

## 2012-08-17 ENCOUNTER — Encounter: Payer: Self-pay | Admitting: Gastroenterology

## 2012-08-17 ENCOUNTER — Ambulatory Visit (AMBULATORY_SURGERY_CENTER): Payer: Medicaid Other | Admitting: Gastroenterology

## 2012-08-17 VITALS — BP 123/67 | HR 80 | Resp 20 | Ht 65.0 in | Wt 143.0 lb

## 2012-08-17 DIAGNOSIS — R1013 Epigastric pain: Secondary | ICD-10-CM

## 2012-08-17 DIAGNOSIS — K3189 Other diseases of stomach and duodenum: Secondary | ICD-10-CM

## 2012-08-17 MED ORDER — SODIUM CHLORIDE 0.9 % IV SOLN: 500.0000 mL | INTRAVENOUS | Status: DC

## 2012-08-17 MED ORDER — HYOSCYAMINE SULFATE ER 0.375 MG PO TBCR: EXTENDED_RELEASE_TABLET | ORAL | Status: DC

## 2012-08-17 NOTE — Op Note (Signed)
[facility] [address] [place], [address]   ENDOSCOPY PROCEDURE REPORT  PATIENT: Teresa [name], Teresa [name]  MR#: [id number] BIRTHDATE: 27-May-1981 , 31  yrs. old GENDER: Female ENDOSCOPIST: Louis Meckel, MD REFERRED BY:  Antionette Char, M.D. PROCEDURE DATE:  08/17/2012 PROCEDURE:  EGD, diagnostic ASA CLASS:     Class II INDICATIONS: MEDICATIONS: MAC sedation, administered by CRNA, Propofol (Diprivan), Propofol (Diprivan) 120 mg IV, and Simethicone 0.6cc PO  TOPICAL ANESTHETIC:  DESCRIPTION OF PROCEDURE: After the risks benefits and alternatives of the procedure were thoroughly explained, informed consent was obtained.  The LB GIF-H180 [id number] endoscope was introduced through the mouth and advanced to the third portion of the duodenum. Without limitations.  The instrument was slowly withdrawn as the mucosa was fully examined.    The upper, middle and distal third of the esophagus were carefully inspected and no abnormalities were noted.  The z-line was well seen at the GEJ.  The endoscope was pushed into the fundus which was normal including a retroflexed view.  The antrum, gastric body, first and second part of the duodenum were unremarkable. Retroflexed views revealed no abnormalities.     The scope was then withdrawn from the patient and the procedure completed.  COMPLICATIONS: There were no complications.  ENDOSCOPIC IMPRESSION: Normal EGD  RECOMMENDATIONS:Trial of hyomax 0.375mg  bid OV 2-3 weeks  REPEAT EXAM:  eSigned:  Louis Meckel, MD 08/17/2012 2:42 PM   CC:

## 2012-08-17 NOTE — Patient Instructions (Addendum)
Call back in 1 week if symptoms are not improved with new medication  Normal EGD.  Trial of Hyomax 0.375mg  twice daily .     Call for office visit in 2-3 weeks.YOU HAD AN ENDOSCOPIC PROCEDURE TODAY AT THE [facility]: Refer to the procedure report that was given to you for any specific questions about what was found during the examination.  If the procedure report does not answer your questions, please call your gastroenterologist to clarify.  If you requested that your care partner not be given the details of your procedure findings, then the procedure report has been included in a sealed envelope for you to review at your convenience later.  YOU SHOULD EXPECT: Some feelings of bloating in the abdomen. Passage of more gas than usual.  Walking can help get rid of the air that was put into your GI tract during the procedure and reduce the bloating. If you had a lower endoscopy (such as a colonoscopy or flexible sigmoidoscopy) you may notice spotting of blood in your stool or on the toilet paper. If you underwent a bowel prep for your procedure, then you may not have a normal bowel movement for a few days.  DIET: Your first meal following the procedure should be a light meal and then it is ok to progress to your normal diet.  A half-sandwich or bowl of soup is an example of a good first meal.  Heavy or fried foods are harder to digest and may make you feel nauseous or bloated.  Likewise meals heavy in dairy and vegetables can cause extra gas to form and this can also increase the bloating.  Drink plenty of fluids but you should avoid alcoholic beverages for 24 hours.  ACTIVITY: Your care partner should take you home directly after the procedure.  You should plan to take it easy, moving slowly for the rest of the day.  You can resume normal activity the day after the procedure however you should NOT DRIVE or use heavy machinery for 24 hours (because of the sedation medicines used during the  test).    SYMPTOMS TO REPORT IMMEDIATELY: A gastroenterologist can be reached at any hour.  During normal business hours, 8:30 AM to 5:00 PM Monday through Friday, call [phone].  After hours and on weekends, please call the GI answering service at [phone] who will take a message and have the physician on call contact you.   Following lower endoscopy (colonoscopy or flexible sigmoidoscopy):  Excessive amounts of blood in the stool  Significant tenderness or worsening of abdominal pains  Swelling of the abdomen that is new, acute  Fever of 100F or higher  Following upper endoscopy (EGD)  Vomiting of blood or coffee ground material  New chest pain or pain under the shoulder blades  Painful or persistently difficult swallowing  New shortness of breath  Fever of 100F or higher  Black, tarry-looking stools  FOLLOW UP: If any biopsies were taken you will be contacted by phone or by letter within the next 1-3 weeks.  Call your gastroenterologist if you have not heard about the biopsies in 3 weeks.  Our staff will call the home number listed on your records the next business day following your procedure to check on you and address any questions or concerns that you may have at that time regarding the information given to you following your procedure. This is a courtesy call and so if there is no answer at the home  number and we have not heard from you through the emergency physician on call, we will assume that you have returned to your regular daily activities without incident.  SIGNATURES/CONFIDENTIALITY: You and/or your care partner have signed paperwork which will be entered into your electronic medical record.  These signatures attest to the fact that that the information above on your After Visit Summary has been reviewed and is understood.  Full responsibility of the confidentiality of this discharge information lies with you and/or your care-partner.

## 2012-08-17 NOTE — Progress Notes (Signed)
The pt's husband, Durwin Nora, helped with translation to the pt.  Pt speaks some broken english. Maw

## 2012-08-17 NOTE — Progress Notes (Signed)
Patient did not experience any of the following events: a burn prior to discharge; a fall within the facility; wrong site/side/patient/procedure/implant event; or a hospital transfer or hospital admission upon discharge from the facility. [phone]) Patient with preoperative order for IV antibiotic SSI prophylaxis, antibiotic initiated on time. [phone])   Pt. Spit several times.  No vomiting.  Ate 2 crackers, well tolerated.

## 2012-08-18 ENCOUNTER — Telehealth: Payer: Self-pay

## 2012-08-18 NOTE — Telephone Encounter (Signed)

## 2012-08-22 ENCOUNTER — Telehealth: Payer: Self-pay | Admitting: Gastroenterology

## 2012-08-22 MED ORDER — DICYCLOMINE HCL 10 MG PO CAPS: 10.0000 mg | ORAL_CAPSULE | Freq: Three times a day (TID) | ORAL | Status: DC

## 2012-08-22 NOTE — Telephone Encounter (Signed)
Pt aware and script sent to pharmacy. 

## 2012-08-22 NOTE — Telephone Encounter (Signed)
Bentyl 10mg  qid prn if she is past her first trimester

## 2012-08-22 NOTE — Telephone Encounter (Signed)
Pts husband states that the prescription she was given (Hyomax) states she would need to not breastfeed for 3 days after taking it. Pt is only nursing and not using any bottles or formula. Pt wants to know if there is something else she could take that is safe with nursing. Dr. Arlyce Dice please advise.

## 2012-09-11 ENCOUNTER — Encounter: Payer: Self-pay | Admitting: Gastroenterology

## 2012-09-11 ENCOUNTER — Ambulatory Visit (INDEPENDENT_AMBULATORY_CARE_PROVIDER_SITE_OTHER): Payer: Medicaid Other | Admitting: Gastroenterology

## 2012-09-11 VITALS — BP 90/60 | HR 72 | Ht 62.75 in | Wt 143.6 lb

## 2012-09-11 DIAGNOSIS — R1013 Epigastric pain: Secondary | ICD-10-CM

## 2012-09-11 DIAGNOSIS — K3189 Other diseases of stomach and duodenum: Secondary | ICD-10-CM

## 2012-09-11 NOTE — Patient Instructions (Addendum)
Follow up as needed

## 2012-09-11 NOTE — Assessment & Plan Note (Signed)
Nonspecific dyspepsia that has resolved. She has responded to dicyclomine.  Recommendations-no further GI workup; return as needed

## 2012-09-11 NOTE — Progress Notes (Signed)
History of Present Illness:  This is a followup visit for abdominal pain. The patient underwent upper endoscopy that was unremarkable.  She has taken dicyclomine with relief of pain. Pain is now very intermittent and infrequent. Altogether she is feeling well.    Review of Systems: Pertinent positive and negative review of systems were noted in the above HPI section. All other review of systems were otherwise negative.    Current Medications, Allergies, Past Medical History, Past Surgical History, Family History and Social History were reviewed in [employer] electronic medical record  Vital signs were reviewed in today's medical record. Physical Exam: General: Well developed , well nourished, no acute distress

## 2013-03-02 IMAGING — CT CT ANGIO CHEST
1 of 2 series · 19 of 32 positions shown · IV contrast (OMNIPAQUE)
Comparison: None.

CLINICAL DATA: Difficulty breathing.  3 days postpartum.

CT ANGIOGRAPHY CHEST
TECHNIQUE: Multidetector CT imaging of the chest using the
standard protocol during bolus administration of intravenous
contrast. Multiplanar reconstructed images including MIPs were
obtained and reviewed to evaluate the vascular anatomy.
Contrast: 100mL OMNIPAQUE IOHEXOL 300 MG/ML  SOLN

[Series 9: (person_name) thins · axial · 0.67mm/px · z∈[+16,+220]mm · 19 of 326 slices shown]
[im 17/326  lung]
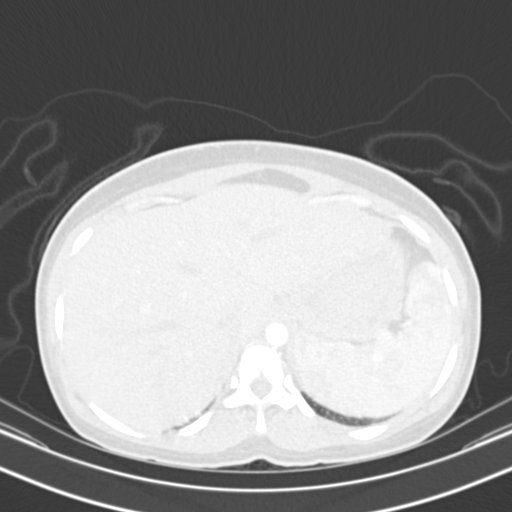
[im 33/326  mediastinal]
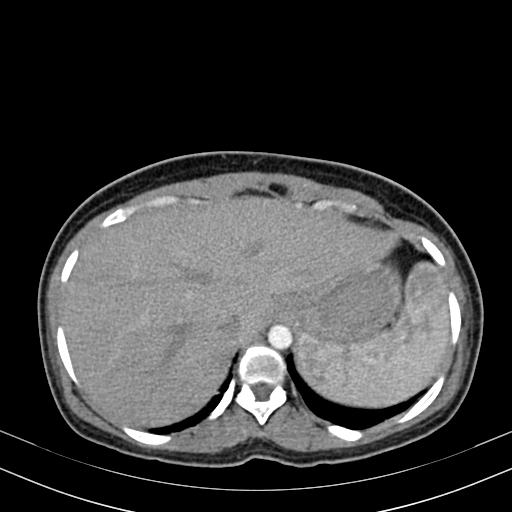
[im 49/326  lung]
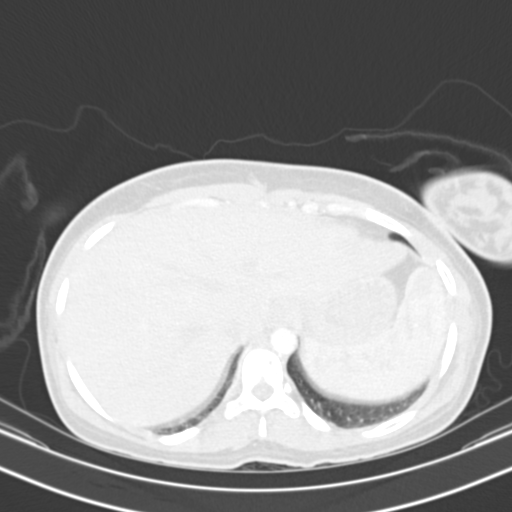
[im 82/326  mediastinal]
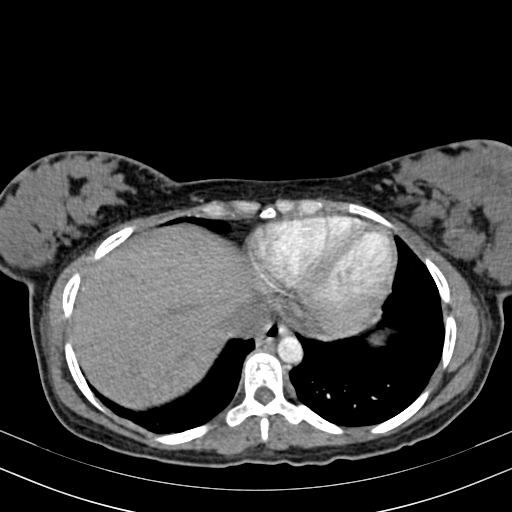
[im 98/326  lung]
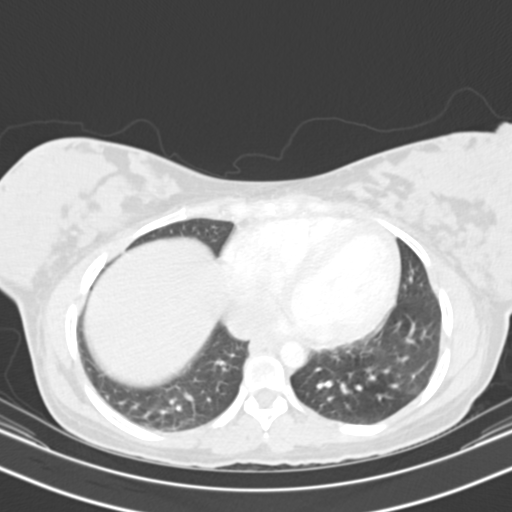
[im 109/326  mediastinal]
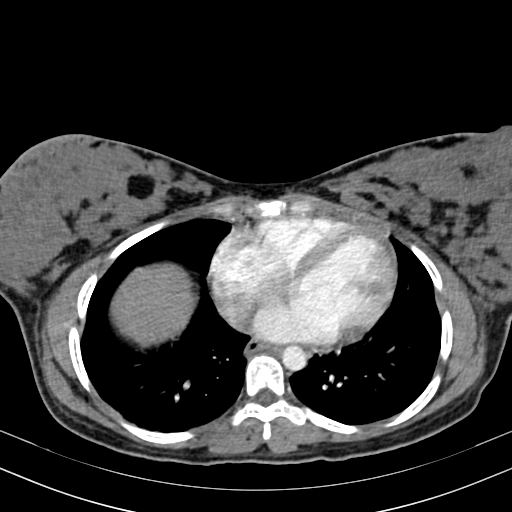
[im 114/326  lung]
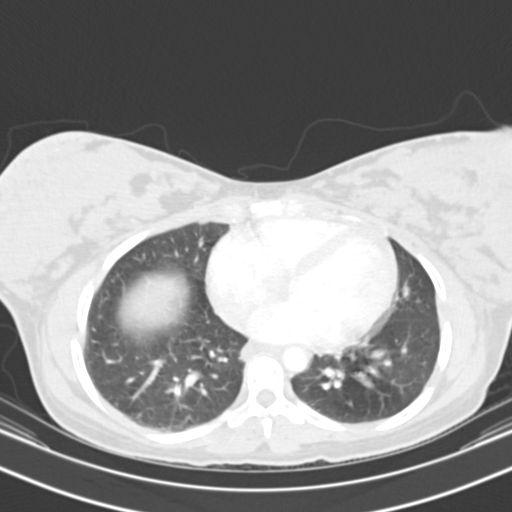
[im 131/326  mediastinal]
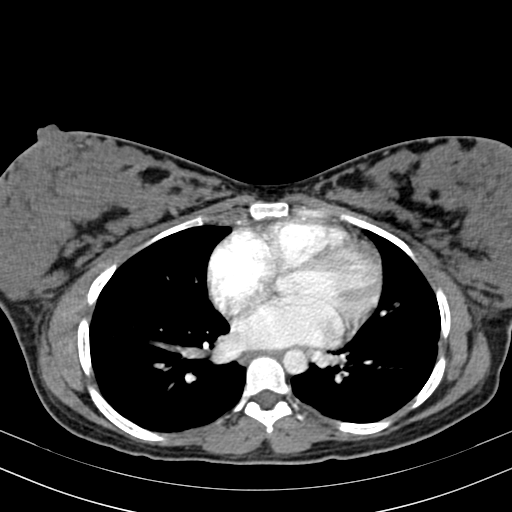
[im 147/326  lung]
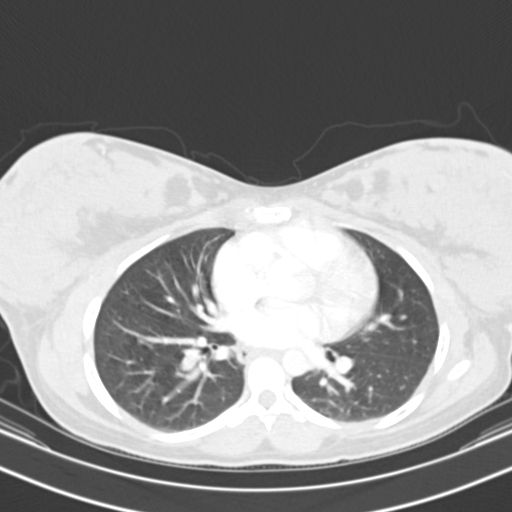
[im 163/326  mediastinal]
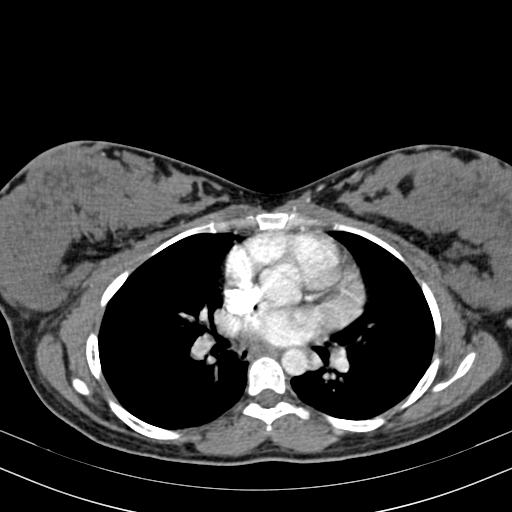
[im 179/326  lung]
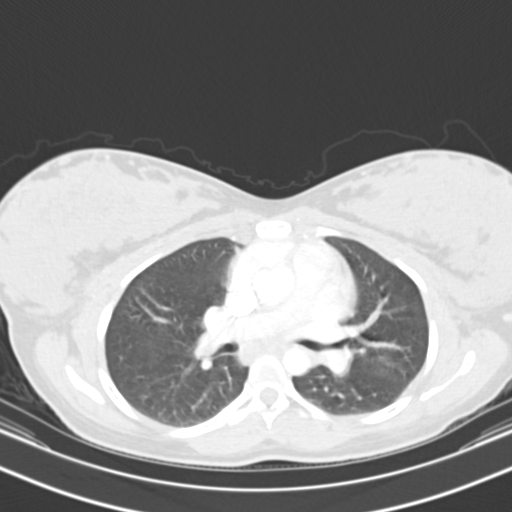
[im 196/326  mediastinal]
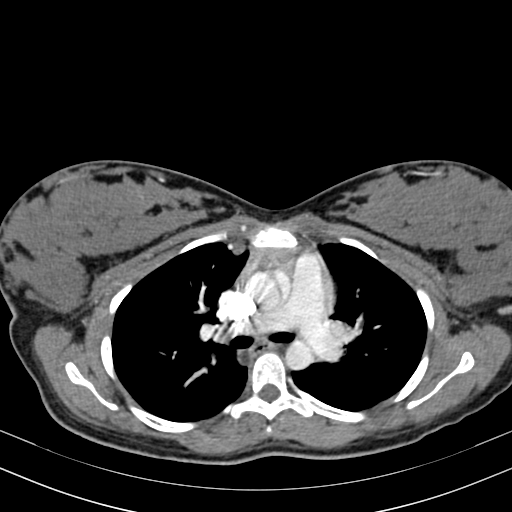
[im 212/326  lung]
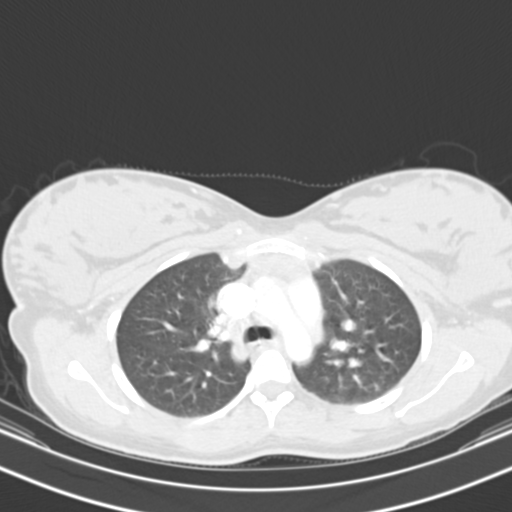
[im 217/326  mediastinal]
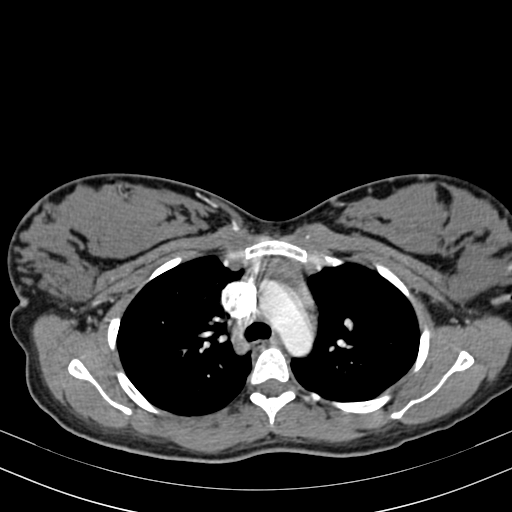
[im 228/326  lung]
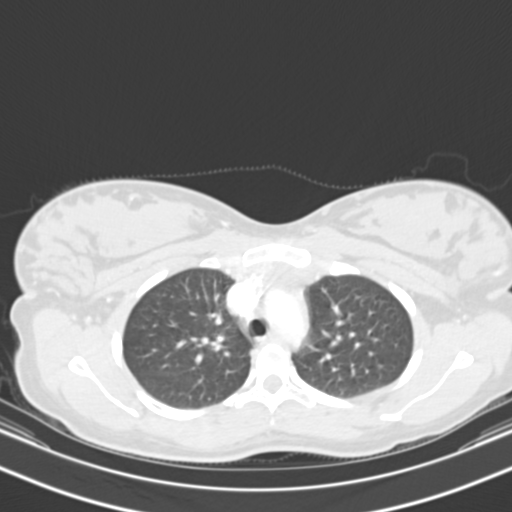
[im 244/326  mediastinal]
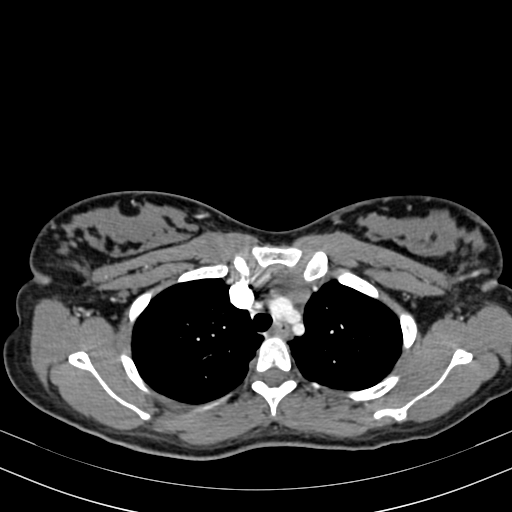
[im 277/326  lung]
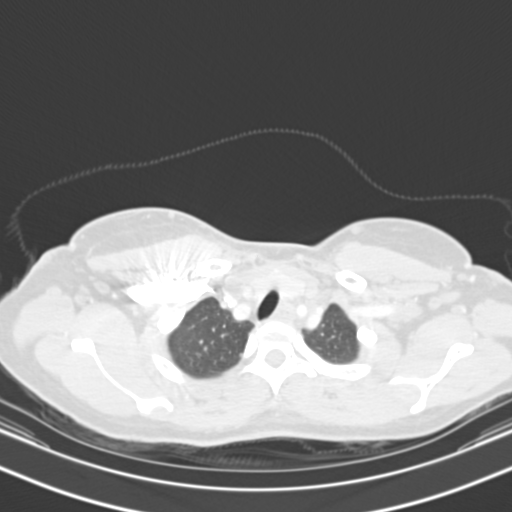
[im 293/326  mediastinal]
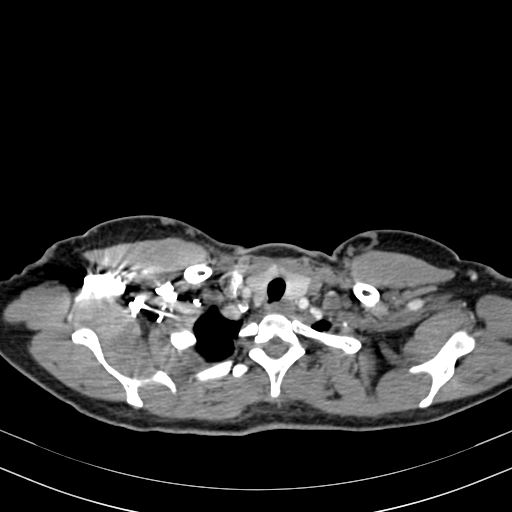
[im 309/326  lung]
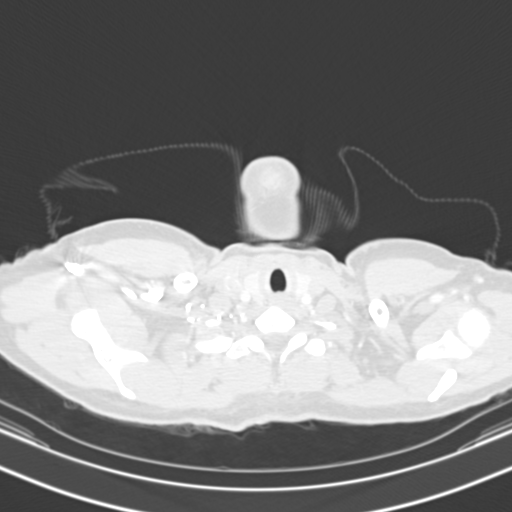

[19 of 32 positions shown; findings below may reference images not displayed]

FINDINGS: Contrast bolus is somewhat suboptimal, resulting in
limited visualization of the distal pulmonary arteries.  There is
moderately good visualization of the central and proximal segmental
pulmonary arteries.  No focal filling defects are demonstrated to
suggest significant pulmonary embolus.  Normal caliber thoracic
aorta.  Linear lucency in the ascending thoracic aorta may
represent motion artifact but dissection should be excluded.
Increased density in the anterior mediastinum is likely residual
thymic tissue. The esophagus is decompressed.  No pleural effusion.
Visualization of the lung fields is limited due to respiratory
motion artifact but no gross consolidation or pneumothorax is
demonstrated.  The airways appear patent.  Mildly prominent left
axillary lymph nodes are likely to be reactive.  Otherwise, no
significant lymphadenopathy in the chest.
IMPRESSION: Somewhat limited contrast bolus but no significant central
pulmonary embolus.  Linear low attenuation in the ascending aorta
is probably due to motion artifact but dissection should be
excluded. No focal consolidation in the lungs.

Results were discussed at the time dictation, 7686 hours on
04/09/2012, with Tia, RN.

## 2013-05-23 ENCOUNTER — Encounter: Payer: Self-pay | Admitting: Obstetrics & Gynecology

## 2013-05-23 ENCOUNTER — Other Ambulatory Visit: Payer: Self-pay | Admitting: Obstetrics & Gynecology

## 2013-05-23 ENCOUNTER — Ambulatory Visit (INDEPENDENT_AMBULATORY_CARE_PROVIDER_SITE_OTHER): Payer: Medicaid Other | Admitting: Obstetrics & Gynecology

## 2013-05-23 VITALS — BP 117/73 | HR 83 | Temp 98.1°F | Wt 139.0 lb

## 2013-05-23 DIAGNOSIS — R300 Dysuria: Secondary | ICD-10-CM

## 2013-05-23 DIAGNOSIS — Z975 Presence of (intrauterine) contraceptive device: Secondary | ICD-10-CM

## 2013-05-23 DIAGNOSIS — N926 Irregular menstruation, unspecified: Secondary | ICD-10-CM

## 2013-05-23 DIAGNOSIS — N921 Excessive and frequent menstruation with irregular cycle: Secondary | ICD-10-CM

## 2013-05-23 LAB — POCT URINALYSIS DIPSTICK
Glucose, UA: NEGATIVE
Nitrite, UA: NEGATIVE
Urobilinogen, UA: NEGATIVE

## 2013-05-23 LAB — HEMOGLOBIN AND HEMATOCRIT, BLOOD: HCT: 35.7 % — ABNORMAL LOW (ref 36.0–46.0)

## 2013-05-23 MED ORDER — MEFENAMIC ACID 250 MG PO CAPS: 2.0000 | ORAL_CAPSULE | Freq: Three times a day (TID) | ORAL | Status: AC

## 2013-05-23 MED ORDER — FLUCONAZOLE 150 MG PO TABS: 150.0000 mg | ORAL_TABLET | Freq: Once | ORAL | Status: DC

## 2013-05-23 NOTE — Patient Instructions (Signed)

## 2013-05-23 NOTE — Progress Notes (Signed)
Subjective:     Teresa [name] is a 32 y.o. female here for a routine exam.  Current complaints: Patient reports she had nexplanon inserted 05/2012 and has not had a cycle since. Patient reports she has had continuous bleeding with vayring flow for 16 days. Patient also thinks she has burning and itching in vulvar area.   Gynecologic History No LMP recorded. Patient has had an implant. Contraception: Nexplanon Last Pap: 11/2011. Results were: normal   Obstetric History OB History   Grav Para Term Preterm Abortions TAB SAB Ect Mult Living   2 2 2       2      # Outc Date GA Lbr Len/2nd Wgt Sex Del Anes PTL Lv   1 TRM 2/11 [redacted]w[redacted]d   M CS   Yes   Comments: Fetal Distress   2 TRM 4/13 [redacted]w[redacted]d 00:00 7lb5.5oz(3.33kg) M LTCS Spinal  Yes       The following portions of the patient's history were reviewed and updated as appropriate: allergies, current medications, past family history, past medical history, past social history, past surgical history and problem list.  Review of Systems Pertinent items are noted in HPI.    Objective:    Pelvic: cervix normal in appearance, external genitalia normal, no adnexal masses or tenderness, no cervical motion tenderness, uterus normal size, shape, and consistency and menstrual blood present    Assessment:   Unscheduled, prolonged bleed--subdermal implant in place Vulvovaginitis  Plan:    NSAIDS, Diflucan Return prn

## 2013-05-24 LAB — WET PREP BY MOLECULAR PROBE
Candida species: NEGATIVE
Trichomonas vaginosis: NEGATIVE

## 2013-05-25 ENCOUNTER — Encounter: Payer: Self-pay | Admitting: Obstetrics & Gynecology

## 2013-05-25 DIAGNOSIS — N926 Irregular menstruation, unspecified: Secondary | ICD-10-CM

## 2013-05-25 HISTORY — DX: Irregular menstruation, unspecified: N92.6

## 2013-06-04 ENCOUNTER — Encounter: Payer: Self-pay | Admitting: Obstetrics

## 2013-06-12 ENCOUNTER — Other Ambulatory Visit: Payer: Self-pay | Admitting: *Deleted

## 2013-06-12 DIAGNOSIS — B9689 Other specified bacterial agents as the cause of diseases classified elsewhere: Secondary | ICD-10-CM

## 2013-06-12 MED ORDER — METRONIDAZOLE 500 MG PO TABS: 500.0000 mg | ORAL_TABLET | Freq: Two times a day (BID) | ORAL | Status: DC

## 2013-06-12 NOTE — Progress Notes (Signed)
Pt called regarding lab results. Wet Prep from [place] showed she has BV. Rx sent to the pharmacy per nursing protocol.

## 2013-06-15 ENCOUNTER — Emergency Department (HOSPITAL_COMMUNITY)
Admission: EM | Admit: 2013-06-15 | Discharge: 2013-06-15 | Disposition: A | Discharge status: Home / Self Care | Payer: Medicaid Other | Attending: Emergency Medicine | Admitting: Emergency Medicine

## 2013-06-15 ENCOUNTER — Encounter (HOSPITAL_COMMUNITY): Payer: Self-pay | Admitting: Family Medicine

## 2013-06-15 ENCOUNTER — Emergency Department (HOSPITAL_COMMUNITY): Payer: Medicaid Other

## 2013-06-15 DIAGNOSIS — Z8719 Personal history of other diseases of the digestive system: Secondary | ICD-10-CM | POA: Insufficient documentation

## 2013-06-15 DIAGNOSIS — Z8711 Personal history of peptic ulcer disease: Secondary | ICD-10-CM | POA: Insufficient documentation

## 2013-06-15 DIAGNOSIS — Z862 Personal history of diseases of the blood and blood-forming organs and certain disorders involving the immune mechanism: Secondary | ICD-10-CM | POA: Insufficient documentation

## 2013-06-15 DIAGNOSIS — Z8739 Personal history of other diseases of the musculoskeletal system and connective tissue: Secondary | ICD-10-CM | POA: Insufficient documentation

## 2013-06-15 DIAGNOSIS — Z792 Long term (current) use of antibiotics: Secondary | ICD-10-CM | POA: Insufficient documentation

## 2013-06-15 DIAGNOSIS — Z3202 Encounter for pregnancy test, result negative: Secondary | ICD-10-CM | POA: Insufficient documentation

## 2013-06-15 DIAGNOSIS — J45909 Unspecified asthma, uncomplicated: Secondary | ICD-10-CM | POA: Insufficient documentation

## 2013-06-15 DIAGNOSIS — R1013 Epigastric pain: Secondary | ICD-10-CM

## 2013-06-15 LAB — CBC WITH DIFFERENTIAL/PLATELET
Basophils Relative: 0 % (ref 0–1)
Eosinophils Relative: 4 % (ref 0–5)
Hemoglobin: 12.3 g/dL (ref 12.0–15.0)
Lymphs Abs: 2.5 10*3/uL (ref 0.7–4.0)
MCH: 21.2 pg — ABNORMAL LOW (ref 26.0–34.0)
MCV: 63.2 fL — ABNORMAL LOW (ref 78.0–100.0)
Monocytes Absolute: 0.4 10*3/uL (ref 0.1–1.0)
Monocytes Relative: 6 % (ref 3–12)
Neutrophils Relative %: 49 % (ref 43–77)
Platelets: 193 10*3/uL (ref 150–400)
RBC: 5.79 MIL/uL — ABNORMAL HIGH (ref 3.87–5.11)
WBC: 6 10*3/uL (ref 4.0–10.5)

## 2013-06-15 LAB — COMPREHENSIVE METABOLIC PANEL
ALT: 10 U/L (ref 0–35)
AST: 12 U/L (ref 0–37)
CO2: 21 mEq/L (ref 19–32)
Chloride: 104 mEq/L (ref 96–112)
GFR calc Af Amer: >90 mL/min (ref >90)
GFR calc non Af Amer: >90 mL/min (ref >90)
Glucose, Bld: 91 mg/dL (ref 70–99)
Sodium: 135 mEq/L (ref 135–145)
Total Bilirubin: 0.7 mg/dL (ref 0.3–1.2)

## 2013-06-15 LAB — URINALYSIS, ROUTINE W REFLEX MICROSCOPIC
Bilirubin Urine: NEGATIVE
Glucose, UA: NEGATIVE mg/dL
Hgb urine dipstick: NEGATIVE
Ketones, ur: NEGATIVE mg/dL
Protein, ur: NEGATIVE mg/dL
Urobilinogen, UA: 1 mg/dL (ref 0.0–1.0)

## 2013-06-15 LAB — POCT PREGNANCY, URINE: Preg Test, Ur: NEGATIVE

## 2013-06-15 MED ORDER — MORPHINE SULFATE 4 MG/ML IJ SOLN
4.0000 mg | Freq: Once | INTRAMUSCULAR | Status: AC
  Administered 2013-06-15: 4 mg via INTRAVENOUS
  Filled 2013-06-15: qty 1

## 2013-06-15 MED ORDER — PANTOPRAZOLE SODIUM 20 MG PO TBEC: 20.0000 mg | DELAYED_RELEASE_TABLET | Freq: Every day | ORAL | Status: DC

## 2013-06-15 MED ORDER — SUCRALFATE 1 GM/10ML PO SUSP: ORAL | Status: DC

## 2013-06-15 MED ORDER — ONDANSETRON HCL 4 MG/2ML IJ SOLN
4.0000 mg | Freq: Once | INTRAMUSCULAR | Status: AC
  Administered 2013-06-15: 4 mg via INTRAVENOUS
  Filled 2013-06-15: qty 2

## 2013-06-15 MED ORDER — GI COCKTAIL ~~LOC~~: 30.0000 mL | Freq: Once | ORAL | Status: DC

## 2013-06-15 NOTE — ED Provider Notes (Signed)
History    CSN: [id number] Arrival date & time 06/15/13  0941  First MD Initiated Contact with Patient 06/15/13 [phone]     Chief Complaint  Patient presents with  . Abdominal Pain   (Consider location/radiation/quality/duration/timing/severity/associated sxs/prior Treatment) HPI  Teresa [name] Is a 32 year old female with a history of epigastric pain.  She presents emergency Department with chief complaint of about one week of epigastric pain, nausea and vomiting.  Patient has been seen for this previously with outpatient followup at gastroenterology.  She has been tried on multiple medications without successful treatment.  She has had an upper endoscopy done which did not show ulceration.. patient states that she has intermittent epigastric pain which is worse after eating a spicy or oily food.  She states that her pain is also severe when she gets hungry.  She denies any hematochezia or melena.  She states that her vomitus is nonbilious and nonbloody.  She had several episodes of watery diarrhea. She denies any recent foreign travel, ingestion of suspect food, or known contacts with a similar symptoms. Denies fevers, chills, myalgias, arthralgias. Denies DOE, SOB, chest tightness or pressure, radiation to left arm, jaw or back, or diaphoresis. Denies dysuria, flank pain, suprapubic pain, frequency, urgency, or hematuria. Denies headaches, light headedness, weakness, visual disturbances.  Past Medical History  Diagnosis Date  . Asthma     Ventolin  . GERD (gastroesophageal reflux disease)   . Ulcer   . Anemia   . Arthritis    Past Surgical History  Procedure Laterality Date  . Cesarean section    . Cesarean section  04/06/2012    Procedure: CESAREAN SECTION;  Surgeon: Antionette Char, MD;  Location: [facility];  Service: Gynecology;  Laterality: N/A;   Family History  Problem Relation Age of Onset  . Diabetes Mother   . Colon cancer Neg Hx   . Esophageal cancer Neg Hx   .  Rectal cancer Neg Hx   . Stomach cancer Neg Hx    History  Substance Use Topics  . Smoking status: Never Smoker   . Smokeless tobacco: Never Used  . Alcohol Use: No   OB History   Grav Para Term Preterm Abortions TAB SAB Ect Mult Living   2 2 2       2      Review of Systems Ten systems reviewed and are negative for acute change, except as noted in the HPI.   Allergies  Review of patient's allergies indicates no known allergies.  Home Medications   Current Outpatient Rx  Name  Route  Sig  Dispense  Refill  . etonogestrel (NEXPLANON) 68 MG IMPL implant   Subcutaneous   Inject 1 each into the skin once.         Marland Kitchen ibuprofen (ADVIL,MOTRIN) 200 MG tablet   Oral   Take 200 mg by mouth as needed for pain.         . metroNIDAZOLE (FLAGYL) 500 MG tablet   Oral   Take 1 tablet (500 mg total) by mouth 2 (two) times daily.   14 tablet   0    BP 119/76  Pulse 80  Temp(Src) 98.4 F (36.9 C)  Resp 18  SpO2 100% Physical Exam Physical Exam  Nursing note and vitals reviewed. Constitutional: She is oriented to person, place, and time. She appears well-developed and well-nourished. No distress.  HENT:  Head: Normocephalic and atraumatic.  Eyes: Conjunctivae normal and EOM are normal. Pupils are equal, round, and  reactive to light. No scleral icterus.  Neck: Normal range of motion.  Cardiovascular: Normal rate, regular rhythm and normal heart sounds.  Exam reveals no gallop and no friction rub.   No murmur heard. Pulmonary/Chest: Effort normal and breath sounds normal. No respiratory distress.  Abdominal: Soft. Bowel sounds are normal. She exhibits no distension and no mass.  Tenderness in the epigastrium and left upper quadrant without rebound, guarding, or rigidity. Neurological: She is alert and oriented to person, place, and time.  Skin: Skin is warm and dry. She is not diaphoretic.    ED Course  Procedures (including critical care time) Labs Reviewed  CBC WITH  DIFFERENTIAL - Abnormal; Notable for the following:    RBC 5.79 (*)    MCV 63.2 (*)    MCH 21.2 (*)    RDW 15.7 (*)    All other components within normal limits  COMPREHENSIVE METABOLIC PANEL  LIPASE, BLOOD  URINALYSIS, ROUTINE W REFLEX MICROSCOPIC  POCT PREGNANCY, URINE   No results found. 1. Abdominal pain, acute, epigastric     MDM  11:07 AM Patient has been seen for her epigastric pain.  Last I see from Dr. Arlyce Dice and states that if her EGD is negative she will need an abdominal ultrasound.  I cannot find any notes on ultrasound procedure for this patient.  And have ordered abdominal ultrasound.  She will have pain medication.  She denies nausea at this time.   Patient with negative work up/US/Labs. She has some abnormalities on CBC that I do not believe are sig. I spent about 15 minutes couseling the patient about foods/nsaids and other substances that can irritate the stomach. I will discharge the patient with carafate and protonix. She should follow up with Dr. Arlyce Dice for further evaluation.  Arthor Captain, PA-C 06/15/13 1550

## 2013-06-15 NOTE — ED Notes (Signed)
Per pt and family generalized abdominal pain and back pain x 1 week. sts associated with multiple episodes of N,V,D.

## 2013-06-15 NOTE — ED Notes (Signed)
Pt states she is breast feeding

## 2013-06-16 NOTE — ED Provider Notes (Signed)
Medical screening examination/treatment/procedure(s) were performed by non-physician practitioner and as supervising physician I was immediately available for consultation/collaboration.   Joya Gaskins, MD 06/16/13 1524

## 2013-07-26 ENCOUNTER — Encounter (HOSPITAL_COMMUNITY): Payer: Self-pay | Admitting: Cardiology

## 2013-07-26 ENCOUNTER — Emergency Department (HOSPITAL_COMMUNITY)
Admission: EM | Admit: 2013-07-26 | Discharge: 2013-07-26 | Disposition: A | Discharge status: Home / Self Care | Payer: Medicaid Other | Attending: Emergency Medicine | Admitting: Emergency Medicine

## 2013-07-26 DIAGNOSIS — Z3202 Encounter for pregnancy test, result negative: Secondary | ICD-10-CM | POA: Insufficient documentation

## 2013-07-26 DIAGNOSIS — R197 Diarrhea, unspecified: Secondary | ICD-10-CM | POA: Insufficient documentation

## 2013-07-26 DIAGNOSIS — K279 Peptic ulcer, site unspecified, unspecified as acute or chronic, without hemorrhage or perforation: Secondary | ICD-10-CM | POA: Insufficient documentation

## 2013-07-26 DIAGNOSIS — Z8739 Personal history of other diseases of the musculoskeletal system and connective tissue: Secondary | ICD-10-CM | POA: Insufficient documentation

## 2013-07-26 DIAGNOSIS — Z8719 Personal history of other diseases of the digestive system: Secondary | ICD-10-CM | POA: Insufficient documentation

## 2013-07-26 DIAGNOSIS — Z862 Personal history of diseases of the blood and blood-forming organs and certain disorders involving the immune mechanism: Secondary | ICD-10-CM | POA: Insufficient documentation

## 2013-07-26 DIAGNOSIS — Z872 Personal history of diseases of the skin and subcutaneous tissue: Secondary | ICD-10-CM | POA: Insufficient documentation

## 2013-07-26 DIAGNOSIS — J45909 Unspecified asthma, uncomplicated: Secondary | ICD-10-CM | POA: Insufficient documentation

## 2013-07-26 DIAGNOSIS — R110 Nausea: Secondary | ICD-10-CM | POA: Insufficient documentation

## 2013-07-26 LAB — CBC WITH DIFFERENTIAL/PLATELET
Basophils Relative: 0 % (ref 0–1)
Eosinophils Absolute: 0.1 10*3/uL (ref 0.0–0.7)
Eosinophils Relative: 2 % (ref 0–5)
Hemoglobin: 12.9 g/dL (ref 12.0–15.0)
Lymphocytes Relative: 28 % (ref 12–46)
MCHC: 34 g/dL (ref 30.0–36.0)
Neutrophils Relative %: 65 % (ref 43–77)
RBC: 5.98 MIL/uL — ABNORMAL HIGH (ref 3.87–5.11)

## 2013-07-26 LAB — URINALYSIS, ROUTINE W REFLEX MICROSCOPIC
Bilirubin Urine: NEGATIVE
Glucose, UA: NEGATIVE mg/dL
Specific Gravity, Urine: 1.026 (ref 1.005–1.030)
Urobilinogen, UA: 1 mg/dL (ref 0.0–1.0)
pH: 6 (ref 5.0–8.0)

## 2013-07-26 LAB — COMPREHENSIVE METABOLIC PANEL
ALT: 11 U/L (ref 0–35)
AST: 13 U/L (ref 0–37)
Albumin: 4.3 g/dL (ref 3.5–5.2)
CO2: 20 mEq/L (ref 19–32)
Calcium: 9.5 mg/dL (ref 8.4–10.5)
GFR calc non Af Amer: >90 mL/min (ref >90)
Sodium: 137 mEq/L (ref 135–145)

## 2013-07-26 LAB — POCT I-STAT TROPONIN I

## 2013-07-26 LAB — URINE MICROSCOPIC-ADD ON

## 2013-07-26 LAB — POCT PREGNANCY, URINE: Preg Test, Ur: NEGATIVE

## 2013-07-26 LAB — LIPASE, BLOOD: Lipase: 53 U/L (ref 11–59)

## 2013-07-26 MED ORDER — RANITIDINE HCL 150 MG PO TABS: 150.0000 mg | ORAL_TABLET | Freq: Two times a day (BID) | ORAL | Status: DC

## 2013-07-26 MED ORDER — ONDANSETRON 4 MG PO TBDP
8.0000 mg | ORAL_TABLET | Freq: Once | ORAL | Status: AC
  Administered 2013-07-26: 8 mg via ORAL

## 2013-07-26 MED ORDER — HYDROCODONE-ACETAMINOPHEN 5-325 MG PO TABS
1.0000 | ORAL_TABLET | Freq: Once | ORAL | Status: AC
  Administered 2013-07-26: 1 via ORAL
  Filled 2013-07-26: qty 1

## 2013-07-26 MED ORDER — FENTANYL CITRATE 0.05 MG/ML IJ SOLN: 50.0000 ug | Freq: Once | INTRAMUSCULAR | Status: AC

## 2013-07-26 MED ORDER — FAMOTIDINE IN NACL 20-0.9 MG/50ML-% IV SOLN
20.0000 mg | Freq: Once | INTRAVENOUS | Status: AC
  Administered 2013-07-26: 20 mg via INTRAVENOUS
  Filled 2013-07-26: qty 50

## 2013-07-26 MED ORDER — FENTANYL CITRATE 0.05 MG/ML IJ SOLN
INTRAMUSCULAR | Status: AC
  Administered 2013-07-26: 50 ug via NASAL
  Filled 2013-07-26: qty 2

## 2013-07-26 MED ORDER — ONDANSETRON 4 MG PO TBDP
ORAL_TABLET | ORAL | Status: AC
  Filled 2013-07-26: qty 2

## 2013-07-26 MED ORDER — HYDROCODONE-ACETAMINOPHEN 5-325 MG PO TABS: 1.0000 | ORAL_TABLET | Freq: Four times a day (QID) | ORAL | Status: DC | PRN

## 2013-07-26 MED ORDER — MORPHINE SULFATE 4 MG/ML IJ SOLN
4.0000 mg | Freq: Once | INTRAMUSCULAR | Status: AC
  Administered 2013-07-26: 4 mg via INTRAVENOUS
  Filled 2013-07-26: qty 1

## 2013-07-26 NOTE — ED Notes (Signed)
Discharge papers reviewed with patient. All questions answered.

## 2013-07-26 NOTE — ED Notes (Signed)
Pt reports epigastric pain that started this afternoon around 1200. States that she feels nauseated and vomited. Also reports some SOB with the pain. States hx of the same about 2 months ago. No acute findings.

## 2013-07-26 NOTE — ED Provider Notes (Addendum)
CSN: [id number]     Arrival date & time 07/26/13  1554 History     First MD Initiated Contact with Patient 07/26/13 1818     Chief Complaint  Patient presents with  . Abdominal Pain   (Consider location/radiation/quality/duration/timing/severity/associated sxs/prior Treatment) HPI Comments: 32 year old female with a history of epigastric pain.  Pt is from [place], and states that she was diagnoses with bleeding ulcer whilst over there. She has sudden onset, sharp epigastric pain this afternoon, non radiating, with some nausea, non bloody diarrhea (BM x 6 today).  Patient has been seen for this previously with outpatient followup at gastroenterology. Admits that the pain is worse after eating food, but that was not the case today. She denies any recent foreign travel, ingestion of suspect food, or known contacts with a similar symptoms.   Patient is a 32 y.o. female presenting with abdominal pain. The history is provided by the patient.  Abdominal Pain Associated symptoms: diarrhea and nausea   Associated symptoms: no chest pain, no dysuria, no shortness of breath and no vomiting     Past Medical History  Diagnosis Date  . Asthma     Ventolin  . GERD (gastroesophageal reflux disease)   . Ulcer   . Anemia   . Arthritis    Past Surgical History  Procedure Laterality Date  . Cesarean section    . Cesarean section  04/06/2012    Procedure: CESAREAN SECTION;  Surgeon: Antionette Char, MD;  Location: [facility];  Service: Gynecology;  Laterality: N/A;   Family History  Problem Relation Age of Onset  . Diabetes Mother   . Colon cancer Neg Hx   . Esophageal cancer Neg Hx   . Rectal cancer Neg Hx   . Stomach cancer Neg Hx    History  Substance Use Topics  . Smoking status: Never Smoker   . Smokeless tobacco: Never Used  . Alcohol Use: No   OB History   Grav Para Term Preterm Abortions TAB SAB Ect Mult Living   2 2 2       2      Review of Systems  Constitutional: Positive for  activity change.  HENT: Negative for neck pain.   Respiratory: Negative for shortness of breath.   Cardiovascular: Negative for chest pain.  Gastrointestinal: Positive for nausea, abdominal pain and diarrhea. Negative for vomiting.  Genitourinary: Negative for dysuria.  Neurological: Negative for headaches.    Allergies  Review of patient's allergies indicates no known allergies.  Home Medications   Current Outpatient Rx  Name  Route  Sig  Dispense  Refill  . etonogestrel (NEXPLANON) 68 MG IMPL implant   Subcutaneous   Inject 1 each into the skin once.          BP 122/79  Pulse 74  Temp(Src) 97.9 F (36.6 C) (Oral)  Resp 13  SpO2 100% Physical Exam  Constitutional: She is oriented to person, place, and time. She appears well-developed and well-nourished.  HENT:  Head: Normocephalic and atraumatic.  Eyes: EOM are normal. Pupils are equal, round, and reactive to light.  Neck: Neck supple.  Cardiovascular: Normal rate, regular rhythm and normal heart sounds.   No murmur heard. Pulmonary/Chest: Effort normal. No respiratory distress.  Abdominal: Soft. She exhibits no distension. There is tenderness. There is no rebound and no guarding.  Neurological: She is alert and oriented to person, place, and time.  Skin: Skin is warm and dry.    ED Course   Procedures (including  critical care time)  Labs Reviewed  CBC WITH DIFFERENTIAL - Abnormal; Notable for the following:    RBC 5.98 (*)    MCV 63.4 (*)    MCH 21.6 (*)    All other components within normal limits  COMPREHENSIVE METABOLIC PANEL - Abnormal; Notable for the following:    Glucose, Bld 105 (*)    All other components within normal limits  URINALYSIS, ROUTINE W REFLEX MICROSCOPIC  POCT I-STAT TROPONIN I   No results found. No diagnosis found.  MDM  DDx includes: Pancreatitis Hepatobiliary pathology including cholecystitis Gastritis/PUD SBO ACS syndrome Aortic Dissection  Pt comes in with cc of  epigastric pain. Pt has known hx of ulcer, and the pain is typical for ulcers. She is hemodynamically stable, no hematemesis, or hematochezia, melena. Will give pepcid. Will request GI follow up   Derwood Kaplan, MD 07/26/13 1308  Derwood Kaplan, MD 07/26/13 1926  8:28 PM Review shows EGD last year - no ulcers. Not sure what the cause is, all the labs are normal. She has been pain free now. There is no indication for CT at this time - there is no jaundice, abnml labs, etc to be concerned for tumors, mass. Will defer further evaluation to her pcp.  Derwood Kaplan, MD 07/26/13 2030

## 2013-07-27 LAB — URINE CULTURE: Culture: NO GROWTH

## 2013-10-31 ENCOUNTER — Ambulatory Visit
Admission: RE | Admit: 2013-10-31 | Discharge: 2013-10-31 | Disposition: A | Discharge status: Home / Self Care | Payer: No Typology Code available for payment source | Source: Ambulatory Visit | Attending: Infectious Disease | Admitting: Infectious Disease

## 2013-10-31 ENCOUNTER — Other Ambulatory Visit: Payer: Self-pay | Admitting: Infectious Disease

## 2013-10-31 DIAGNOSIS — A150 Tuberculosis of lung: Secondary | ICD-10-CM

## 2013-12-27 ENCOUNTER — Ambulatory Visit: Payer: Medicaid Other | Admitting: Obstetrics & Gynecology

## 2014-03-21 ENCOUNTER — Encounter (HOSPITAL_COMMUNITY): Payer: Self-pay | Admitting: Emergency Medicine

## 2014-03-21 ENCOUNTER — Emergency Department (HOSPITAL_COMMUNITY)
Admission: EM | Admit: 2014-03-21 | Discharge: 2014-03-21 | Disposition: A | Discharge status: Home / Self Care | Payer: Medicaid Other | Source: Home / Self Care | Attending: Emergency Medicine | Admitting: Emergency Medicine

## 2014-03-21 DIAGNOSIS — H00019 Hordeolum externum unspecified eye, unspecified eyelid: Secondary | ICD-10-CM

## 2014-03-21 MED ORDER — TOBRAMYCIN 0.3 % OP SOLN: 2.0000 [drp] | OPHTHALMIC | Status: DC

## 2014-03-21 NOTE — ED Provider Notes (Signed)
CSN: [id number]     Arrival date & time 03/21/14  1231 History   First MD Initiated Contact with Patient 03/21/14 1418     Chief Complaint  Patient presents with  . Eye Problem   (Consider location/radiation/quality/duration/timing/severity/associated sxs/prior Treatment) Patient is a 33 y.o. female presenting with eye problem. The history is provided by the patient. No language interpreter was used.  Eye Problem Location:  R eye Quality:  Aching Severity:  Moderate Onset quality:  Gradual Timing:  Constant Progression:  Worsening Chronicity:  New Relieved by:  Nothing Worsened by:  Nothing tried Ineffective treatments:  None tried Associated symptoms: blurred vision, crusting and redness     Past Medical History  Diagnosis Date  . Asthma     Ventolin  . GERD (gastroesophageal reflux disease)   . Ulcer   . Anemia   . Arthritis    Past Surgical History  Procedure Laterality Date  . Cesarean section    . Cesarean section  04/06/2012    Procedure: CESAREAN SECTION;  Surgeon: Lahoma Crocker, MD;  Location: [facility];  Service: Gynecology;  Laterality: N/A;   Family History  Problem Relation Age of Onset  . Diabetes Mother   . Colon cancer Neg Hx   . Esophageal cancer Neg Hx   . Rectal cancer Neg Hx   . Stomach cancer Neg Hx    History  Substance Use Topics  . Smoking status: Never Smoker   . Smokeless tobacco: Never Used  . Alcohol Use: No   OB History   Grav Para Term Preterm Abortions TAB SAB Ect Mult Living   2 2 2       2      Review of Systems  Eyes: Positive for blurred vision, pain and redness.  All other systems reviewed and are negative.    Allergies  Review of patient's allergies indicates no known allergies.  Home Medications   Current Outpatient Rx  Name  Route  Sig  Dispense  Refill  . etonogestrel (NEXPLANON) 68 MG IMPL implant   Subcutaneous   Inject 1 each into the skin once.         Marland Kitchen HYDROcodone-acetaminophen (NORCO/VICODIN)  5-325 MG per tablet   Oral   Take 1 tablet by mouth every 6 (six) hours as needed for pain.   15 tablet   0   . ranitidine (ZANTAC) 150 MG tablet   Oral   Take 1 tablet (150 mg total) by mouth 2 (two) times daily.   60 tablet   0    BP 113/68  Pulse 92  Temp(Src) 99.1 F (37.3 C) (Oral)  Resp 20  SpO2 98%  LMP 02/28/2014 Physical Exam  Nursing note and vitals reviewed. Constitutional: She is oriented to person, place, and time. She appears well-developed and well-nourished.  HENT:  Head: Normocephalic and atraumatic.  Eyes: EOM are normal. Pupils are equal, round, and reactive to light.  Injected conjunctiva, swollen left eyelid  Neck: Normal range of motion.  Cardiovascular: Normal rate.   Pulmonary/Chest: Effort normal.  Musculoskeletal: Normal range of motion.  Neurological: She is alert and oriented to person, place, and time.  Psychiatric: She has a normal mood and affect.    ED Course  Procedures (including critical care time) Labs Review Labs Reviewed - No data to display Imaging Review No results found.   MDM   1. Sty    Tobrex opth solution   [place], [place] 03/21/14 1436

## 2014-03-21 NOTE — ED Provider Notes (Signed)
Medical screening examination/treatment/procedure(s) were performed by non-physician practitioner and as supervising physician I was immediately available for consultation/collaboration.  Philipp Deputy, M.D.  Harden Mo, MD 03/21/14 Drema Halon

## 2014-03-21 NOTE — Discharge Instructions (Signed)
Sty A sty (hordeolum) is an infection of a gland in the eyelid located at the base of the eyelash. A sty may develop a white or yellow head of pus. It can be puffy (swollen). Usually, the sty will burst and pus will come out on its own. They do not leave lumps in the eyelid once they drain. A sty is often confused with another form of cyst of the eyelid called a chalazion. Chalazions occur within the eyelid and not on the edge where the bases of the eyelashes are. They often are red, sore and then form firm lumps in the eyelid. CAUSES   Germs (bacteria).  Lasting (chronic) eyelid inflammation. SYMPTOMS   Tenderness, redness and swelling along the edge of the eyelid at the base of the eyelashes.  Sometimes, there is a white or yellow head of pus. It may or may not drain. DIAGNOSIS  An ophthalmologist will be able to distinguish between a sty and a chalazion and treat the condition appropriately.  TREATMENT   Styes are typically treated with warm packs (compresses) until drainage occurs.  In rare cases, medicines that kill germs (antibiotics) may be prescribed. These antibiotics may be in the form of drops, cream or pills.  If a hard lump has formed, it is generally necessary to do a small incision and remove the hardened contents of the cyst in a minor surgical procedure done in the office.  In suspicious cases, your caregiver may send the contents of the cyst to the lab to be certain that it is not a rare, but dangerous form of cancer of the glands of the eyelid. HOME CARE INSTRUCTIONS   Wash your hands often and dry them with a clean towel. Avoid touching your eyelid. This may spread the infection to other parts of the eye.  Apply heat to your eyelid for 10 to 20 minutes, several times a day, to ease pain and help to heal it faster.  Do not squeeze the sty. Allow it to drain on its own. Wash your eyelid carefully 3 to 4 times per day to remove any pus. SEEK IMMEDIATE MEDICAL CARE IF:    Your eye becomes painful or puffy (swollen).  Your vision changes.  Your sty does not drain by itself within 3 days.  Your sty comes back within a short period of time, even with treatment.  You have redness (inflammation) around the eye.  You have a fever. Document Released: 09/15/2005 Document Revised: 02/28/2012 Document Reviewed: 05/20/2009 [employer] Patient Information 2014 [employer].  

## 2014-03-21 NOTE — ED Notes (Signed)
Pt  rerports     She     Woke up  sev  Days  Ago  With   A swollen    Red   l  Eye        No  Known  fb     She  Is  Speaking in  Complete  sentances

## 2014-06-05 ENCOUNTER — Ambulatory Visit (INDEPENDENT_AMBULATORY_CARE_PROVIDER_SITE_OTHER): Payer: Medicaid Other | Admitting: Obstetrics & Gynecology

## 2014-06-05 ENCOUNTER — Encounter: Payer: Self-pay | Admitting: Obstetrics & Gynecology

## 2014-06-05 VITALS — BP 114/68 | HR 94 | Temp 98.5°F | Wt 147.0 lb

## 2014-06-05 DIAGNOSIS — M549 Dorsalgia, unspecified: Secondary | ICD-10-CM

## 2014-06-05 DIAGNOSIS — Z0000 Encounter for general adult medical examination without abnormal findings: Secondary | ICD-10-CM

## 2014-06-05 NOTE — Patient Instructions (Addendum)
Vaginitis Vaginitis is an inflammation of the vagina. It can happen when the normal bacteria and yeast in the vagina grow too much. There are different types. Treatment will depend on the type you have. HOME CARE  Take all medicines as told by your doctor.  Keep your vagina area clean and dry. Avoid soap. Rinse the area with water.  Avoid washing and cleaning out the vagina (douching).  Do not use tampons or have sex (intercourse) until your treatment is done.  Wipe from front to back after going to the restroom.  Wear cotton underwear.  Avoid wearing underwear while you sleep until your vaginitis is gone.  Avoid tight pants. Avoid underwear or nylons without a cotton panel.  Take off wet clothing (such as a bathing suit) as soon as you can.  Use mild, unscented products. Avoid fabric softeners and scented:  Feminine sprays.  Laundry detergents.  Tampons.  Soaps or bubble baths.  Practice safe sex and use condoms. GET HELP RIGHT AWAY IF:   You have belly (abdominal) pain.  You have a fever or lasting symptoms for more than 2-3 days.  You have a fever and your symptoms suddenly get worse. MAKE SURE YOU:   Understand these instructions.  Will watch this condition.  Will get help right away if you are not doing well or get worse. Document Released: 03/04/2009 Document Revised: 08/30/2012 Document Reviewed: 05/18/2012 [facility] Patient Information 2015 [place], [place]. This information is not intended to replace advice given to you by your health care provider. Make sure you discuss any questions you have with your health care provider. Health Maintenance, Female A healthy lifestyle and preventative care can promote health and wellness.  Maintain regular health, dental, and eye exams.  Eat a healthy diet. Foods like vegetables, fruits, whole grains, low-fat dairy products, and lean protein foods contain the nutrients you need without too many calories. Decrease your  intake of foods high in solid fats, added sugars, and salt. Get information about a proper diet from your caregiver, if necessary.  Regular physical exercise is one of the most important things you can do for your health. Most adults should get at least 150 minutes of moderate-intensity exercise (any activity that increases your heart rate and causes you to sweat) each week. In addition, most adults need muscle-strengthening exercises on 2 or more days a week.   Maintain a healthy weight. The body mass index (BMI) is a screening tool to identify possible weight problems. It provides an estimate of body fat based on height and weight. Your caregiver can help determine your BMI, and can help you achieve or maintain a healthy weight. For adults 20 years and older:  A BMI below 18.5 is considered underweight.  A BMI of 18.5 to 24.9 is normal.  A BMI of 25 to 29.9 is considered overweight.  A BMI of 30 and above is considered obese.  Maintain normal blood lipids and cholesterol by exercising and minimizing your intake of saturated fat. Eat a balanced diet with plenty of fruits and vegetables. Blood tests for lipids and cholesterol should begin at age 1 and be repeated every 5 years. If your lipid or cholesterol levels are high, you are over 50, or you are a high risk for heart disease, you may need your cholesterol levels checked more frequently.Ongoing high lipid and cholesterol levels should be treated with medicines if diet and exercise are not effective.  If you smoke, find out from your caregiver how to quit.  If you do not use tobacco, do not start.  Lung cancer screening is recommended for adults aged 63-80 years who are at high risk for developing lung cancer because of a history of smoking. Yearly low-dose computed tomography (CT) is recommended for people who have at least a 30-pack-year history of smoking and are a current smoker or have quit within the past 15 years. A pack year of smoking  is smoking an average of 1 pack of cigarettes a day for 1 year (for example: 1 pack a day for 30 years or 2 packs a day for 15 years). Yearly screening should continue until the smoker has stopped smoking for at least 15 years. Yearly screening should also be stopped for people who develop a health problem that would prevent them from having lung cancer treatment.  If you are pregnant, do not drink alcohol. If you are breastfeeding, be very cautious about drinking alcohol. If you are not pregnant and choose to drink alcohol, do not exceed 1 drink per day. One drink is considered to be 12 ounces (355 mL) of beer, 5 ounces (148 mL) of wine, or 1.5 ounces (44 mL) of liquor.  Avoid use of street drugs. Do not share needles with anyone. Ask for help if you need support or instructions about stopping the use of drugs.  High blood pressure causes heart disease and increases the risk of stroke. Blood pressure should be checked at least every 1 to 2 years. Ongoing high blood pressure should be treated with medicines, if weight loss and exercise are not effective.  If you are 60 to 31 years old, ask your caregiver if you should take aspirin to prevent strokes.  Diabetes screening involves taking a blood sample to check your fasting blood sugar level. This should be done once every 3 years, after age 73, if you are within normal weight and without risk factors for diabetes. Testing should be considered at a younger age or be carried out more frequently if you are overweight and have at least 1 risk factor for diabetes.  Breast cancer screening is essential preventative care for women. You should practice "breast self-awareness." This means understanding the normal appearance and feel of your breasts and may include breast self-examination. Any changes detected, no matter how small, should be reported to a caregiver. Women in their 14s and 30s should have a clinical breast exam (CBE) by a caregiver as part of a  regular health exam every 1 to 3 years. After age 63, women should have a CBE every year. Starting at age 49, women should consider having a mammogram (breast X-ray) every year. Women who have a family history of breast cancer should talk to their caregiver about genetic screening. Women at a high risk of breast cancer should talk to their caregiver about having an MRI and a mammogram every year.  Breast cancer gene (BRCA)-related cancer risk assessment is recommended for women who have family members with BRCA-related cancers. BRCA-related cancers include breast, ovarian, tubal, and peritoneal cancers. Having family members with these cancers may be associated with an increased risk for harmful changes (mutations) in the breast cancer genes BRCA1 and BRCA2. Results of the assessment will determine the need for genetic counseling and BRCA1 and BRCA2 testing.  The Pap test is a screening test for cervical cancer. Women should have a Pap test starting at age 72. Between ages 23 and 34, Pap tests should be repeated every 2 years. Beginning at age 36, you should  have a Pap test every 3 years as long as the past 3 Pap tests have been normal. If you had a hysterectomy for a problem that was not cancer or a condition that could lead to cancer, then you no longer need Pap tests. If you are between ages 66 and 52, and you have had normal Pap tests going back 10 years, you no longer need Pap tests. If you have had past treatment for cervical cancer or a condition that could lead to cancer, you need Pap tests and screening for cancer for at least 20 years after your treatment. If Pap tests have been discontinued, risk factors (such as a new sexual partner) need to be reassessed to determine if screening should be resumed. Some women have medical problems that increase the chance of getting cervical cancer. In these cases, your caregiver may recommend more frequent screening and Pap tests.  The human papillomavirus (HPV)  test is an additional test that may be used for cervical cancer screening. The HPV test looks for the virus that can cause the cell changes on the cervix. The cells collected during the Pap test can be tested for HPV. The HPV test could be used to screen women aged 64 years and older, and should be used in women of any age who have unclear Pap test results. After the age of 75, women should have HPV testing at the same frequency as a Pap test.  Colorectal cancer can be detected and often prevented. Most routine colorectal cancer screening begins at the age of 2 and continues through age 2. However, your caregiver may recommend screening at an earlier age if you have risk factors for colon cancer. On a yearly basis, your caregiver may provide home test kits to check for hidden blood in the stool. Use of a small camera at the end of a tube, to directly examine the colon (sigmoidoscopy or colonoscopy), can detect the earliest forms of colorectal cancer. Talk to your caregiver about this at age 97, when routine screening begins. Direct examination of the colon should be repeated every 5 to 10 years through age 48, unless early forms of pre-cancerous polyps or small growths are found.  Hepatitis C blood testing is recommended for all people born from [birth year] through 1963 and any individual with known risks for hepatitis C.  Practice safe sex. Use condoms and avoid high-risk sexual practices to reduce the spread of sexually transmitted infections (STIs). Sexually active women aged 14 and younger should be checked for Chlamydia, which is a common sexually transmitted infection. Older women with new or multiple partners should also be tested for Chlamydia. Testing for other STIs is recommended if you are sexually active and at increased risk.  Osteoporosis is a disease in which the bones lose minerals and strength with aging. This can result in serious bone fractures. The risk of osteoporosis can be identified using  a bone density scan. Women ages 27 and over and women at risk for fractures or osteoporosis should discuss screening with their caregivers. Ask your caregiver whether you should be taking a calcium supplement or vitamin D to reduce the rate of osteoporosis.  Menopause can be associated with physical symptoms and risks. Hormone replacement therapy is available to decrease symptoms and risks. You should talk to your caregiver about whether hormone replacement therapy is right for you.  Use sunscreen. Apply sunscreen liberally and repeatedly throughout the day. You should seek shade when your shadow is shorter than you. Protect  yourself by wearing long sleeves, pants, a wide-brimmed hat, and sunglasses year round, whenever you are outdoors.  Notify your caregiver of new moles or changes in moles, especially if there is a change in shape or color. Also notify your caregiver if a mole is larger than the size of a pencil eraser.  Stay current with your immunizations. Document Released: 06/21/2011 Document Revised: 04/02/2013 Document Reviewed: 11/07/2013 [facility] Patient Information 2015 [place], [place]. This information is not intended to replace advice given to you by your health care provider. Make sure you discuss any questions you have with your health care provider.

## 2014-06-05 NOTE — Progress Notes (Signed)
Subjective:     Teresa [name] is a 33 y.o. female here for a routine exam.     Personal health questionnaire:  Is patient Ashkenazi Jewish, have a family history of breast and/or ovarian cancer: no Is there a family history of uterine cancer diagnosed at age < 62, gastrointestinal cancer, urinary tract cancer, family member who is a Field seismologist syndrome-associated carrier: no Is the patient overweight and hypertensive, family history of diabetes, personal history of gestational diabetes or PCOS: no Is patient over 58, have PCOS,  family history of premature CHD under age 56, diabetes, smoke, have hypertension or peripheral artery disease:  no   Gynecologic History No LMP recorded. Patient has had an implant. Contraception: Nexplanon Last mammogram:  n/a  Obstetric History OB History  Gravida Para Term Preterm AB SAB TAB Ectopic Multiple Living  2 2 2       2     # Outcome Date GA Lbr Len/2nd Weight Sex Delivery Anes PTL Lv  2 TRM 04/06/12 [redacted]w[redacted]d  3.33 kg (7 lb 5.5 oz) M LTCS Spinal  Y  1 TRM 02/06/10 [redacted]w[redacted]d   M CS   Y     Comments: Fetal Distress      Past Medical History  Diagnosis Date  . Asthma     Ventolin  . GERD (gastroesophageal reflux disease)   . Ulcer   . Anemia   . Arthritis     Past Surgical History  Procedure Laterality Date  . Cesarean section    . Cesarean section  04/06/2012    Procedure: CESAREAN SECTION;  Surgeon: Lahoma Crocker, MD;  Location: [facility];  Service: Gynecology;  Laterality: N/A;    Current outpatient prescriptions:etonogestrel (NEXPLANON) 49 MG IMPL implant, Inject 1 each into the skin once., Disp: , Rfl:  No Known Allergies  History  Substance Use Topics  . Smoking status: Never Smoker   . Smokeless tobacco: Never Used  . Alcohol Use: No    Family History  Problem Relation Age of Onset  . Diabetes Mother   . Colon cancer Neg Hx   . Esophageal cancer Neg Hx   . Rectal cancer Neg Hx   . Stomach cancer Neg Hx       Review of  Systems  Constitutional: negative for fatigue and weight loss Respiratory: negative for cough and wheezing Cardiovascular: negative for chest pain, fatigue and palpitations Gastrointestinal: negative for abdominal pain and change in bowel habits Musculoskeletal:negative for myalgias Neurological: negative for gait problems and tremors Behavioral/Psych: negative for abusive relationship, depression Endocrine: negative for temperature intolerance   Genitourinary:negative for abnormal menstrual periods, genital lesions, hot flashes, sexual problems; positive for vaginal discharge Integument/breast: negative for breast lump, breast tenderness, nipple discharge and skin lesion(s)    Objective:       BP 114/68  Pulse 94  Temp(Src) 98.5 F (36.9 C)  Wt 66.679 kg (147 lb) General:   alert  Skin:   no rash or abnormalities  Lungs:   clear to auscultation bilaterally  Heart:   regular rate and rhythm, S1, S2 normal, no murmur, click, rub or gallop  Breasts:   normal without suspicious masses, skin or nipple changes or axillary nodes  Abdomen:  normal findings: no organomegaly, soft, non-tender and no hernia  Pelvis:  External genitalia: normal general appearance Urinary system: urethral meatus normal and bladder without fullness, nontender Vaginal: no tenderness, right-sided upper vagina/side wall Cervix: normal appearance Adnexa: normal bimanual exam Uterus: anteverted and non-tender, normal size  Lab Review  Labs reviewed no Radiologic studies reviewed no    Assessment:    Healthy female exam.  ?Vaginitis--currently using OTC vaginal preparation from a foreign country   Plan:    Education reviewed: depression evaluation, low fat, low cholesterol diet, safe sex/STD prevention and weight bearing exercise.   Orders Placed This Encounter  Procedures  . Urine culture  . WET PREP BY MOLECULAR PROBE  . CBC  . HIV antibody  . RPR  . Hepatitis B surface antigen  .  Urinalysis, Routine w reflex microscopic  . Hemoglobin A1c  . Ambulatory referral to [facility]    Referral Priority:  Routine    Referral Type:  Consultation    Referral Reason:  Specialty Services Required    Requested Specialty:  Family Medicine    Number of Visits Requested:  1  . Ambulatory referral to Internal Medicine    Referral Priority:  Routine    Referral Type:  Consultation    Referral Reason:  Specialty Services Required    Requested Specialty:  Internal Medicine    Number of Visits Requested:  1   Follow up as needed.

## 2014-06-06 LAB — URINALYSIS, ROUTINE W REFLEX MICROSCOPIC
Bilirubin Urine: NEGATIVE
Glucose, UA: NEGATIVE mg/dL
Hgb urine dipstick: NEGATIVE
Ketones, ur: NEGATIVE mg/dL
Leukocytes, UA: NEGATIVE
Nitrite: NEGATIVE
Protein, ur: NEGATIVE mg/dL
Specific Gravity, Urine: 1.027 (ref 1.005–1.030)
Urobilinogen, UA: 1 mg/dL (ref 0.0–1.0)
pH: 5.5 (ref 5.0–8.0)

## 2014-06-06 LAB — CBC
HEMATOCRIT: 36.2 % (ref 36.0–46.0)
Hemoglobin: 12.2 g/dL (ref 12.0–15.0)
MCH: 21.3 pg — ABNORMAL LOW (ref 26.0–34.0)
MCHC: 33.7 g/dL (ref 30.0–36.0)
MCV: 63.1 fL — AB (ref 78.0–100.0)
Platelets: 229 10*3/uL (ref 150–400)
RBC: 5.74 MIL/uL — AB (ref 3.87–5.11)
RDW: 16.9 % — AB (ref 11.5–15.5)
WBC: 6.2 10*3/uL (ref 4.0–10.5)

## 2014-06-06 LAB — SYPHILIS: RPR W/REFLEX TO RPR TITER AND TREPONEMAL ANTIBODIES, TRADITIONAL SCREENING AND DIAGNOSIS ALGORITHM

## 2014-06-06 LAB — WET PREP BY MOLECULAR PROBE
Candida species: NEGATIVE
Gardnerella vaginalis: POSITIVE — AB
Trichomonas vaginosis: NEGATIVE

## 2014-06-06 LAB — HEMOGLOBIN A1C
Hgb A1c MFr Bld: 6.3 % — ABNORMAL HIGH
Mean Plasma Glucose: 134 mg/dL — ABNORMAL HIGH

## 2014-06-06 LAB — HEPATITIS B SURFACE ANTIGEN: Hepatitis B Surface Ag: NEGATIVE

## 2014-06-06 LAB — HIV ANTIBODY (ROUTINE TESTING W REFLEX): HIV 1&2 Ab, 4th Generation: NONREACTIVE

## 2014-06-07 ENCOUNTER — Encounter: Payer: Self-pay | Admitting: Obstetrics & Gynecology

## 2014-06-07 LAB — URINE CULTURE
Colony Count: NO GROWTH
Organism ID, Bacteria: NO GROWTH

## 2014-06-08 LAB — PAP IG AND HPV HIGH-RISK: HPV DNA High Risk: NOT DETECTED

## 2014-07-31 ENCOUNTER — Encounter: Payer: Self-pay | Admitting: *Deleted

## 2014-07-31 DIAGNOSIS — B9689 Other specified bacterial agents as the cause of diseases classified elsewhere: Secondary | ICD-10-CM

## 2014-07-31 DIAGNOSIS — N760 Acute vaginitis: Principal | ICD-10-CM

## 2014-07-31 MED ORDER — METRONIDAZOLE 500 MG PO TABS: 500.0000 mg | ORAL_TABLET | Freq: Two times a day (BID) | ORAL | Status: DC

## 2014-08-01 ENCOUNTER — Telehealth: Payer: Self-pay | Admitting: *Deleted

## 2014-08-01 DIAGNOSIS — Z7689 Persons encountering health services in other specified circumstances: Secondary | ICD-10-CM

## 2014-08-01 NOTE — Telephone Encounter (Signed)
Pt returned call to office after we had attempted to contact her regarding lab results.  Return call to pt making her aware of lab results and medication sent to pharmacy.  Pt also made aware that the office has also mailed her results and recommendations to her as well.  Pt made aware of pre-diabetic lab work and advised to follow up with a PCP in order to follow glucose.  Pt states understanding and would like a referral to a PCP.  Pt made aware that a referral could be made.

## 2014-08-02 ENCOUNTER — Telehealth: Payer: Self-pay

## 2014-08-02 NOTE — Telephone Encounter (Signed)
Called patient to have her call [facility] at [phone] to set up appt with Dr. Rosezella [place] -  Faxed over her referral and medicaid card to them, along with Dr. Archie Endo

## 2014-09-05 ENCOUNTER — Ambulatory Visit: Payer: Medicaid Other | Admitting: Obstetrics & Gynecology

## 2014-10-21 ENCOUNTER — Encounter: Payer: Self-pay | Admitting: Obstetrics & Gynecology

## 2014-12-16 ENCOUNTER — Encounter: Payer: Self-pay | Admitting: *Deleted

## 2014-12-17 ENCOUNTER — Encounter: Payer: Self-pay | Admitting: Obstetrics & Gynecology

## 2015-03-06 ENCOUNTER — Ambulatory Visit: Payer: Medicaid Other | Admitting: Obstetrics

## 2016-01-01 ENCOUNTER — Other Ambulatory Visit (HOSPITAL_COMMUNITY): Payer: Self-pay | Admitting: Nurse Practitioner

## 2016-01-01 DIAGNOSIS — Z3A13 13 weeks gestation of pregnancy: Secondary | ICD-10-CM

## 2016-01-01 DIAGNOSIS — Z3682 Encounter for antenatal screening for nuchal translucency: Secondary | ICD-10-CM

## 2016-01-01 LAB — SICKLE CELL SCREEN: Sickle Cell Screen: NORMAL

## 2016-01-01 LAB — OB RESULTS CONSOLE RUBELLA ANTIBODY, IGM: RUBELLA: IMMUNE

## 2016-01-01 LAB — OB RESULTS CONSOLE GC/CHLAMYDIA
CHLAMYDIA, DNA PROBE: NEGATIVE
GC PROBE AMP, GENITAL: NEGATIVE

## 2016-01-01 LAB — OB RESULTS CONSOLE HIV ANTIBODY (ROUTINE TESTING): HIV: NONREACTIVE

## 2016-01-01 LAB — OB RESULTS CONSOLE VARICELLA ZOSTER ANTIBODY, IGG: Varicella: IMMUNE

## 2016-01-01 LAB — CYSTIC FIBROSIS DIAGNOSTIC STUDY: Interpretation-CFDNA:: NEGATIVE

## 2016-01-14 ENCOUNTER — Other Ambulatory Visit (HOSPITAL_COMMUNITY): Payer: Self-pay | Admitting: Nurse Practitioner

## 2016-01-14 DIAGNOSIS — Z3A13 13 weeks gestation of pregnancy: Secondary | ICD-10-CM

## 2016-01-14 DIAGNOSIS — Z3682 Encounter for antenatal screening for nuchal translucency: Secondary | ICD-10-CM

## 2016-01-14 DIAGNOSIS — Z832 Family history of diseases of the blood and blood-forming organs and certain disorders involving the immune mechanism: Secondary | ICD-10-CM

## 2016-01-15 ENCOUNTER — Ambulatory Visit (HOSPITAL_COMMUNITY)
Admission: RE | Admit: 2016-01-15 | Discharge: 2016-01-15 | Disposition: A | Discharge status: Home / Self Care | Payer: Medicaid Other | Source: Ambulatory Visit | Attending: Nurse Practitioner | Admitting: Nurse Practitioner

## 2016-01-15 ENCOUNTER — Other Ambulatory Visit (HOSPITAL_COMMUNITY): Payer: Self-pay | Admitting: Nurse Practitioner

## 2016-01-15 ENCOUNTER — Encounter (HOSPITAL_COMMUNITY): Payer: Self-pay

## 2016-01-15 ENCOUNTER — Other Ambulatory Visit (HOSPITAL_COMMUNITY): Payer: Medicaid Other

## 2016-01-15 VITALS — BP 110/60 | HR 91 | Wt 144.2 lb

## 2016-01-15 DIAGNOSIS — Z832 Family history of diseases of the blood and blood-forming organs and certain disorders involving the immune mechanism: Secondary | ICD-10-CM | POA: Insufficient documentation

## 2016-01-15 DIAGNOSIS — Z3A15 15 weeks gestation of pregnancy: Secondary | ICD-10-CM

## 2016-01-15 DIAGNOSIS — Z315 Encounter for genetic counseling: Secondary | ICD-10-CM | POA: Insufficient documentation

## 2016-01-15 DIAGNOSIS — Z1389 Encounter for screening for other disorder: Secondary | ICD-10-CM

## 2016-01-15 DIAGNOSIS — Z8489 Family history of other specified conditions: Secondary | ICD-10-CM | POA: Insufficient documentation

## 2016-01-15 DIAGNOSIS — Z0489 Encounter for examination and observation for other specified reasons: Secondary | ICD-10-CM

## 2016-01-15 DIAGNOSIS — Z36 Encounter for antenatal screening of mother: Secondary | ICD-10-CM | POA: Diagnosis not present

## 2016-01-15 DIAGNOSIS — Z3A13 13 weeks gestation of pregnancy: Secondary | ICD-10-CM

## 2016-01-15 DIAGNOSIS — Z3682 Encounter for antenatal screening for nuchal translucency: Secondary | ICD-10-CM

## 2016-01-15 DIAGNOSIS — IMO0002 Reserved for concepts with insufficient information to code with codable children: Secondary | ICD-10-CM

## 2016-01-15 NOTE — Progress Notes (Signed)
Genetic Counseling  High-Risk Gestation Note  Appointment Date:  01/15/2016 Referred By: Teresa Bloom, NP Date of Birth:  [birth year]   Pregnancy History: [id number] Estimated Date of Delivery: 07/08/16  Estimated Gestational Age: 30w0dAttending: PBenjaman Lobe MD   I met with Teresa [name]. Teresa [name] for genetic counseling because of a family history of beta thalassemia minor.  In Summary:  Couple's oldest son has beta thalassemia minor, also referred to as beta thalassemia [name]  Discussed autosomal recessive inheritance of Beta Thalassemia and that at least the patient or her partner would also be expected to have beta thal [name]  Pregnancy would only be at risk for Beta Thalassemia major, or other hemoglobinopathy, if both parents are carriers  Given reported history, the pregnancy is most likely to be at risk for beta thalassemia [name], but not likely for beta thalassemia major  Father of the pregnancy's carrier status is not known; Discussed that screening via hemoglobin electrophoresis is available to him, if desired  Patient would not desire prenatal diagnosis via amniocentesis for hemoglobinopathy, even if pregnancy identified to be at increased risk based on parent's carrier status  Patient is comfortable with newborn screening for hemoglobinopathy  Nuchal translucency ultrasound attempted today; 151w0doday by ultrasound, thus, too advanced for first trimester screening  Patient declined Quad screening today; stated that she does not desire screening for fetal chromosome conditions in the pregnancy  Follow-up ultrasound scheduled for 02/12/16 to complete fetal anatomic survey.   Advanced paternal age; Offer follow-up ultrasound in third trimester to assess fetal growth  We began by reviewing the family history in detail. The patient reported that the couple's oldest son, Teresa [name], also referred to as beta thalassemia minor. He had a history of  anemia that was not responsive to iron, and [facility] thus[facility] Teresa [name]. Teresa Williamsrovided documentation from her son's visit with hematology, including lab values, documenting that he has beta thalassemia [name]. He is reported to be healthy and does not require follow-up with hematology. The patient reported that their second child does not appear to have symptoms of anemia. The patient reported that she does not know the carrier status of herself or her husband regarding beta thalassemia, but understands that given the autosomal recessive inheritance of the condition that at least one of them is an obligate carrier for beta thalassemia [name]. The family histories were otherwise negative for known individuals with beta thalassemia major or [name]. The father of the pregnancy reportedly has two paternal first cousins once removed with sickle cell anemia. Consanguinity between the couple was denied.   Teresa [name]. [facility] counseled that beta-thalassemia major is a genetic condition characterized by reduced synthesis of the beta subunit of hemoglobin (beta globin). We reviewed that hemoglobin is a protein in red blood cells that carries oxygen to the body's organs. There are multiple types of hemoglobin, and these are comprised of different subunits.  The primary adult hemoglobin, denoted as hemoglobin A (Hb A) is made up of two alpha and two beta globin units. Individuals who have beta-thalassemia major have changes within the genes that code for beta globin. The resulting imbalance in the ratio of the alpha chains to the beta chains causes the underlying pathology. In beta-thalassemia, the alpha chains are produced in relative excess. Therefore, because of the absence of the beta chains with which to form a tetramer, the excess alpha chains will precipitate in the cell damaging  the membrane and leading to premature RBC destruction. This results in hypochromic,  microcytic anemia. The anemia is severe and typically leads to hepatosplenomegaly for individuals with beta thalassemia.  Because the beta chain is not used for hemoglobin production during fetal life, the onset of symptoms in beta-thalassemia is not until a few months of life. Beta thalassemia major is typically treated with regular transfusions and chelation therapy, aimed at reducing transfusion iron overload.  Without treatment, individuals with beta-thalassemia major have failure to thrive, feeding problems, and a shortened lifespan. Elevation of hemoglobin A2 (alpha2/delta2) occurs uniquely in beta-thalassemia carriers, and this is what is typically found on hemoglobin electrophoresis for beta thalassemia carriers. Continued production of the delta chains allows for tetramer formation between the alpha and delta chains. Beta-thalassemia produces a variable phenotype dependent upon the severity of the mutations.    We reviewed genes and chromosomes. Teresa [name] was counseled that beta-thalassemia is inherited in an autosomal recessive manner, and occurs when both copies of the beta hemoglobin gene are changed. Typically, one abnormal beta globin gene is inherited from each parent. We discussed that beta thalassemia [name] can be inherited with other beta globin chain variants, such as sickle cell [name] (Hb AS) to also cause other variant hemoglobinopathies, such as sickle beta-thalassemia.  Beta-thalassemia minor occurs when an individual has one altered copy of the beta globin gene and one typical working copy of the beta globin gene.  This is also referred to as being a "carrier" of beta-thalassemia.  Carriers of recessive conditions typically do not have symptoms related to the condition, other than mild anemia in beta thalassemia [name], because they still have one functioning copy of the gene, and thus some production of the typical protein coded for by that gene.  The patient understands that at least  she or her partner have beta thalassemia [name], given the autosomal recessive inheritance and that their son has beta thalassemia [name]. We discussed that the identification of one parent as a carrier does not necessarily rule out carrier status for the other, but the other parent would have the general population chance, if not previously screened. Teresa [name] reported that she does not know for certain if she has beta thalassemia [name] but reports that she has a history of mild anemia. Her past complete blood counts indicate consistently low MCV, low Hb, and low MCH values, which can be consistent with beta thalassemia [name]. We reviewed the hemoglobin electrophoresis can also assess for beta thalassemia [name]. Teresa [name] reported that blood work was recently performed through her OB office. If hemoglobin electrophoresis was not included in these labs, this is available to her in the future, if desired. We discussed that the relatives with sickle cell anemia reported for the father of the pregnancy are distantly related such as this history would not increase his chance to have sickle cell [name] above the general population chance. The reported history for Teresa [name] is suggestive of beta thalassemia [name] for her; however, hemoglobin electrophoresis in combination with complete blood count would be most informative to assess her carrier status.   Given the recessive inheritance, we discussed the importance of understanding the carrier status of the father of the pregnancy in order to accurately predict the risk of beta-thalassemia or other hemoglobinopathy in the fetus. We discussed the option of hemoglobin electrophoresis with a complete blood count and serum ferritin levels to determine whether he has beta-thalassemia [name], or any hemoglobin variant (including sickle  cell [name]). The father of the pregnancy was not present at today's visit. We discussed that this screening can be facilitated  through our office, likely his physician's office or the patient's OB office. Additionally, we discussed that carrier screening is provided through [facility] and [facility].  She understands that an accurate risk assessment cannot be performed without further information.  We discussed that approximately 1 in 10 individuals with African-American ancestry have beta-thalassemia minor or sickle cell [name].  This considered, the risk for beta-thalassemia or other hemoglobinopathy in the fetus is approximately 1 in 86 [1/10 (chance that the father of the pregnancy is a carrier) x 1 (chance that Teresa [name] is carrier) x  (chance that both would pass on the changed gene)].  She understands that if one parent has typical hemoglobin, then the pregnancy would not be at risk to inherit a hemoglobinopathy but would have a 1 in 2 chance to have beta-thalassemia [name] like their son.   We discussed that in the case that both parents are identified to be carriers for hemoglobin variants, prenatal diagnosis via chorionic villus sampling (in the first trimester) or amniocentesis (after [redacted] weeks gestation) would be available, if desired. In the case of beta-thalassemia [name], molecular testing would first need to be performed in the parent who is a carrier to identify the causative mutation in the HBB gene prior to CVS or amniocentesis, given that >200 pathogenic variants have been identified in the HBB gene. Hemoglobin S (or sickle cell [name]) is encoded by a specific point mutation (Glu6Val) in the HBB gene.  We discussed the risks, benefits, and limitations of amniocentesis. We reviewed the associated 1 in 175-102 risk for complications from amniocentesis, including spontaneous pregnancy loss for each. The patient understands that targeted ultrasound in pregnancy would not be expected to see physical differences related to the presence or absence of a hemoglobinopathy. We also reviewed the  availability of newborn screening in [place] for hemoglobinopathies. Teresa [name]. Destynie Toomey stated that she would not be interested in prenatal diagnosis for beta thalassemia in a pregnancy via amniocentesis.   The family histories were otherwise found to be noncontributory for birth defects, intellectual disability, and known genetic conditions. Without further information regarding the provided family history, an accurate genetic risk cannot be calculated. Further genetic counseling is warranted if more information is obtained.  The father of the pregnancy is reported to be 33 years old. Advanced paternal age (APA) is defined as paternal age greater than or equal to age 64.  Recent large-scale sequencing studies have shown that approximately 80% of de novo point mutations are of paternal origin.  Many studies have demonstrated a strong correlation between increased paternal age and de novo point mutations.  Although no specific data is available regarding fetal risks for fathers 47+ years old at conception, it is apparent that the overall risk for single gene conditions is increased.  To estimate the relative increase in risk of a genetic disorder with APA, the heritability of the disease must be considered.  Assuming an approximate 2x increase in risk for conditions that are exclusively paternal in origin, the risk for each individual condition is still relatively low.  It is estimated that the overall chance for a de novo mutation is ~0.5%.  We also discussed the wide range of conditions which can be caused by new dominant gene mutations (achondroplasia, neurofibromatosis, Marfan syndrome etc.).  Genetic testing for each individual single gene condition is not  warranted or available unless ultrasound or family history concerns lend suspicion to a specific condition. A detailed ultrasound at 18+ weeks gestation and a follow up ultrasound at ~28 weeks to monitor fetal growth are available in the pregnancy.  We  reviewed available screening options. Teresa [name]. Shailey Butterbaugh was scheduled today for First trimester screening. However, ultrasound today visualized the pregnancy to be [redacted]w[redacted]d thus, too far advanced in gestation for First screen. Complete ultrasound results reported separately.  Regarding screening tests, we discussed the option of Quad screen and ultrasound.  She understands that screening tests are used to modify a patient's a priori risk for aneuploidy, typically based on age.  This estimate provides a pregnancy specific risk assessment.  We discussed the risks, limitations, and benefits of each.  After reviewing these options, Teresa [name] elected to proceed with ultrasound only in pregnancy. She declined serum screening (Quad screen) in pregnancy and expressed that she would not be interested in invasive testing (amniocentesis) in a pregnancy.   She understands that ultrasound cannot rule out all birth defects or genetic syndromes. The patient was advised of this limitation and states she still does not want diagnostic testing at this time. Follow-up ultrasound was scheduled for 02/12/16.  Teresa [name]. RSharnice BoslerManou denied exposure to environmental toxins or chemical agents. She denied the use of alcohol, tobacco or street drugs. She denied significant viral illnesses during the course of her pregnancy. Her medical and surgical histories were noncontributory.   I counseled Teresa [name]. Rayleen Teresa [name] regarding the above risks and available options.  The approximate face-to-face time with the genetic counselor was 30 minutes.  KChipper Oman Teresa [name] Certified Genetic Counselor 01/15/2016

## 2016-02-12 ENCOUNTER — Ambulatory Visit (HOSPITAL_COMMUNITY)
Admission: RE | Admit: 2016-02-12 | Discharge: 2016-02-12 | Disposition: A | Discharge status: Home / Self Care | Payer: Medicaid Other | Source: Ambulatory Visit | Attending: Nurse Practitioner | Admitting: Nurse Practitioner

## 2016-02-12 ENCOUNTER — Encounter (HOSPITAL_COMMUNITY): Payer: Self-pay

## 2016-02-12 ENCOUNTER — Other Ambulatory Visit (HOSPITAL_COMMUNITY): Payer: Self-pay | Admitting: Maternal and Fetal Medicine

## 2016-02-12 VITALS — BP 107/51 | HR 84 | Temp 98.8°F | Wt 157.0 lb

## 2016-02-12 DIAGNOSIS — Z0489 Encounter for examination and observation for other specified reasons: Secondary | ICD-10-CM

## 2016-02-12 DIAGNOSIS — Z3A19 19 weeks gestation of pregnancy: Secondary | ICD-10-CM | POA: Diagnosis not present

## 2016-02-12 DIAGNOSIS — Z36 Encounter for antenatal screening of mother: Secondary | ICD-10-CM | POA: Diagnosis not present

## 2016-02-12 DIAGNOSIS — Z8489 Family history of other specified conditions: Secondary | ICD-10-CM

## 2016-02-12 DIAGNOSIS — IMO0002 Reserved for concepts with insufficient information to code with codable children: Secondary | ICD-10-CM

## 2016-02-12 DIAGNOSIS — Z832 Family history of diseases of the blood and blood-forming organs and certain disorders involving the immune mechanism: Secondary | ICD-10-CM

## 2016-02-12 NOTE — Progress Notes (Signed)
Pt states vomited 1 time this morning and has a headache.  Temp 98.8

## 2016-03-13 ENCOUNTER — Encounter (HOSPITAL_COMMUNITY): Payer: Self-pay | Admitting: *Deleted

## 2016-03-13 ENCOUNTER — Inpatient Hospital Stay (HOSPITAL_COMMUNITY)
Admission: AD | Admit: 2016-03-13 | Discharge: 2016-03-14 | Disposition: A | Discharge status: Home / Self Care | Payer: Medicaid Other | Source: Ambulatory Visit | Attending: Obstetrics & Gynecology | Admitting: Obstetrics & Gynecology

## 2016-03-13 DIAGNOSIS — O26892 Other specified pregnancy related conditions, second trimester: Secondary | ICD-10-CM | POA: Insufficient documentation

## 2016-03-13 DIAGNOSIS — W109XXA Fall (on) (from) unspecified stairs and steps, initial encounter: Secondary | ICD-10-CM | POA: Insufficient documentation

## 2016-03-13 DIAGNOSIS — W108XXA Fall (on) (from) other stairs and steps, initial encounter: Secondary | ICD-10-CM

## 2016-03-13 DIAGNOSIS — J45909 Unspecified asthma, uncomplicated: Secondary | ICD-10-CM | POA: Insufficient documentation

## 2016-03-13 DIAGNOSIS — Z832 Family history of diseases of the blood and blood-forming organs and certain disorders involving the immune mechanism: Secondary | ICD-10-CM

## 2016-03-13 DIAGNOSIS — O99512 Diseases of the respiratory system complicating pregnancy, second trimester: Secondary | ICD-10-CM | POA: Insufficient documentation

## 2016-03-13 DIAGNOSIS — K219 Gastro-esophageal reflux disease without esophagitis: Secondary | ICD-10-CM | POA: Insufficient documentation

## 2016-03-13 DIAGNOSIS — O99612 Diseases of the digestive system complicating pregnancy, second trimester: Secondary | ICD-10-CM | POA: Insufficient documentation

## 2016-03-13 DIAGNOSIS — Z3A23 23 weeks gestation of pregnancy: Secondary | ICD-10-CM | POA: Insufficient documentation

## 2016-03-13 NOTE — MAU Note (Signed)
About 1700 fell down about 6 steps. Slipped and fell down steps on my buttocks. Denies LOF or bleeding. Good FM. Having some lower back pain, pelvic pain, and R knee pain.

## 2016-03-14 DIAGNOSIS — O99612 Diseases of the digestive system complicating pregnancy, second trimester: Secondary | ICD-10-CM | POA: Diagnosis not present

## 2016-03-14 DIAGNOSIS — J45909 Unspecified asthma, uncomplicated: Secondary | ICD-10-CM | POA: Diagnosis not present

## 2016-03-14 DIAGNOSIS — O26892 Other specified pregnancy related conditions, second trimester: Secondary | ICD-10-CM | POA: Diagnosis present

## 2016-03-14 DIAGNOSIS — O99512 Diseases of the respiratory system complicating pregnancy, second trimester: Secondary | ICD-10-CM | POA: Diagnosis not present

## 2016-03-14 DIAGNOSIS — Z3A23 23 weeks gestation of pregnancy: Secondary | ICD-10-CM | POA: Diagnosis not present

## 2016-03-14 DIAGNOSIS — K219 Gastro-esophageal reflux disease without esophagitis: Secondary | ICD-10-CM | POA: Diagnosis not present

## 2016-03-14 DIAGNOSIS — W109XXA Fall (on) (from) unspecified stairs and steps, initial encounter: Secondary | ICD-10-CM | POA: Diagnosis not present

## 2016-03-14 MED ORDER — ACETAMINOPHEN 325 MG PO TABS
650.0000 mg | ORAL_TABLET | Freq: Once | ORAL | Status: AC
  Administered 2016-03-14: 650 mg via ORAL
  Filled 2016-03-14: qty 2

## 2016-03-14 MED ORDER — ACETAMINOPHEN 500 MG PO TABS: 500.0000 mg | ORAL_TABLET | Freq: Four times a day (QID) | ORAL | Status: DC | PRN

## 2016-03-14 MED ORDER — CYCLOBENZAPRINE HCL 5 MG PO TABS: 5.0000 mg | ORAL_TABLET | Freq: Three times a day (TID) | ORAL | Status: DC | PRN

## 2016-03-14 MED ORDER — CYCLOBENZAPRINE HCL 5 MG PO TABS
5.0000 mg | ORAL_TABLET | Freq: Once | ORAL | Status: AC
  Administered 2016-03-14: 5 mg via ORAL
  Filled 2016-03-14: qty 1

## 2016-03-14 NOTE — Discharge Instructions (Signed)
Come to the MAU (maternity admission unit) for 1) Strong contractions every 2-3 minutes for at least 1 hour that do not go away when you drink water or take a warm shower. These contractions will be so strong all you can do is breath through them 2) Vaginal bleeding- anything more than spotting 3) Loss of fluid like you broke your water 4) Decreased movement of your baby  

## 2016-03-14 NOTE — MAU Provider Note (Signed)
History     CSN: [id number]  Arrival date and time: 03/13/16 2244   First Provider Initiated Contact with Patient 03/14/16 0015      Chief Complaint  Patient presents with  . Fall   HPI Patient is 35 y.o. [id number] [redacted]w[redacted]d here with complaints of fall down 6 steps. Reports she was walking down the step and lost her balance. Reports hitting tail bone, right leg and knee. Reports pain in these areas. Did not try any medications at home.   +FM, denies LOF, VB, contractions, vaginal discharge.  OB History    Gravida Para Term Preterm AB TAB SAB Ectopic Multiple Living   3 2 2       2       Past Medical History  Diagnosis Date  . Asthma     Ventolin  . GERD (gastroesophageal reflux disease)   . Ulcer   . Anemia   . Arthritis     Past Surgical History  Procedure Laterality Date  . Cesarean section    . Cesarean section  04/06/2012    Procedure: CESAREAN SECTION;  Surgeon: Lahoma Crocker, MD;  Location: [facility];  Service: Gynecology;  Laterality: N/A;    Family History  Problem Relation Age of Onset  . Diabetes Mother   . Colon cancer Neg Hx   . Esophageal cancer Neg Hx   . Rectal cancer Neg Hx   . Stomach cancer Neg Hx   . Other Son     Beta Thalassemia trait (minor)    Social History  Substance Use Topics  . Smoking status: Never Smoker   . Smokeless tobacco: Never Used  . Alcohol Use: No    Allergies: No Known Allergies  Prescriptions prior to admission  Medication Sig Dispense Refill Last Dose  . etonogestrel (NEXPLANON) 68 MG IMPL implant Inject 1 each into the skin once. Reported on 02/12/2016   Not Taking  . metroNIDAZOLE (FLAGYL) 500 MG tablet Take 1 tablet (500 mg total) by mouth 2 (two) times daily. (Patient not taking: Reported on 01/15/2016) 14 tablet 0 Not Taking  . Prenatal Vit-Fe Fumarate-FA (PRENATAL VITAMIN PO) Take by mouth.   Taking    Review of Systems  Constitutional: Negative for fever and chills.  Eyes: Negative for blurred vision and  double vision.  Respiratory: Negative for cough and shortness of breath.   Cardiovascular: Negative for chest pain and orthopnea.  Gastrointestinal: Negative for nausea and vomiting.  Genitourinary: Negative for dysuria, frequency and flank pain.  Musculoskeletal: Negative for myalgias.  Skin: Negative for rash.  Neurological: Negative for dizziness, tingling, weakness and headaches.  Endo/Heme/Allergies: Does not bruise/bleed easily.  Psychiatric/Behavioral: Negative for depression and suicidal ideas. The patient is not nervous/anxious.    Physical Exam   Blood pressure 122/55, pulse 96, temperature 98.3 F (36.8 C), temperature source Oral, resp. rate 18, height 5\' 5"  (1.651 m), weight 149 lb (67.586 kg), last menstrual period 10/11/2015.  Physical Exam  Nursing note and vitals reviewed. Constitutional: She is oriented to person, place, and time. She appears well-developed and well-nourished. No distress.  Pregnant female  HENT:  Head: Normocephalic and atraumatic.  Eyes: Conjunctivae are normal. No scleral icterus.  Neck: Normal range of motion. Neck supple.  Cardiovascular: Normal rate and intact distal pulses.   Respiratory: Effort normal. She exhibits no tenderness.  GI: Soft. There is no tenderness. There is no rebound and no guarding.  Gravid  Musculoskeletal: Normal range of motion. She exhibits no edema.  Mild  TTP over base of spine, right hip. Able to flex and extend hip. Able to ambulate without problem. Sensation intact distally.   Neurological: She is alert and oriented to person, place, and time.  Skin: Skin is warm and dry. No rash noted.  Psychiatric: She has a normal mood and affect.    MAU Course  Procedures  MDM NST- appropriate for gestational age.    Assessment and Plan   1. Fall   Plan - home with flexeril and tylenol - no concerning/red flag sx- if pain persists consider imaging  Caren Macadam 03/14/2016, 12:34 AM

## 2016-06-08 ENCOUNTER — Inpatient Hospital Stay (HOSPITAL_COMMUNITY)
Admission: AD | Admit: 2016-06-08 | Discharge: 2016-06-08 | Disposition: A | Discharge status: Home / Self Care | Payer: Medicaid Other | Source: Ambulatory Visit | Attending: Obstetrics & Gynecology | Admitting: Obstetrics & Gynecology

## 2016-06-08 ENCOUNTER — Encounter (HOSPITAL_COMMUNITY): Payer: Self-pay | Admitting: *Deleted

## 2016-06-08 DIAGNOSIS — Z833 Family history of diabetes mellitus: Secondary | ICD-10-CM | POA: Insufficient documentation

## 2016-06-08 DIAGNOSIS — O26899 Other specified pregnancy related conditions, unspecified trimester: Secondary | ICD-10-CM

## 2016-06-08 DIAGNOSIS — R1030 Lower abdominal pain, unspecified: Secondary | ICD-10-CM | POA: Diagnosis present

## 2016-06-08 DIAGNOSIS — O26893 Other specified pregnancy related conditions, third trimester: Secondary | ICD-10-CM | POA: Insufficient documentation

## 2016-06-08 DIAGNOSIS — Z3A35 35 weeks gestation of pregnancy: Secondary | ICD-10-CM | POA: Diagnosis not present

## 2016-06-08 DIAGNOSIS — N949 Unspecified condition associated with female genital organs and menstrual cycle: Secondary | ICD-10-CM

## 2016-06-08 DIAGNOSIS — R102 Pelvic and perineal pain: Secondary | ICD-10-CM | POA: Diagnosis not present

## 2016-06-08 DIAGNOSIS — O9989 Other specified diseases and conditions complicating pregnancy, childbirth and the puerperium: Secondary | ICD-10-CM

## 2016-06-08 LAB — URINALYSIS, ROUTINE W REFLEX MICROSCOPIC
BILIRUBIN URINE: NEGATIVE
Glucose, UA: NEGATIVE mg/dL
Hgb urine dipstick: NEGATIVE
Ketones, ur: 15 mg/dL — AB
Nitrite: NEGATIVE
PH: 5.5 (ref 5.0–8.0)
Protein, ur: NEGATIVE mg/dL
Specific Gravity, Urine: 1.01 (ref 1.005–1.030)

## 2016-06-08 LAB — URINE MICROSCOPIC-ADD ON: RBC / HPF: NONE SEEN RBC/hpf (ref 0–5)

## 2016-06-08 NOTE — MAU Note (Signed)
C/o of R sided abdominal pain that runs down her R leg- since yesterday @ 2100;

## 2016-06-08 NOTE — MAU Provider Note (Signed)
History     CSN: [id number]  Arrival date and time: 06/08/16 [id number]   First Provider Initiated Contact with Patient 06/08/16 0801      Chief Complaint  Patient presents with  . Abdominal Pain   HPI  Teresa [name] is a 35 y.o. G3P2002 at [redacted]w[redacted]d who presents with abdominal pain. Goes to [facility] for prenatal care. Pain started yesterday while she was walking. Reports sharp lower abdominal pain that radiates down the front of her leg. Rates pain 5/10. Has not treated. Pain is worse with movement & walking. Denies LOF or vaginal bleeding. Positive fetal movement.   OB History    Gravida Para Term Preterm AB TAB SAB Ectopic Multiple Living   3 2 2       2       Past Medical History  Diagnosis Date  . Asthma     Ventolin  . GERD (gastroesophageal reflux disease)   . Ulcer   . Anemia   . Arthritis     Past Surgical History  Procedure Laterality Date  . Cesarean section    . Cesarean section  04/06/2012    Procedure: CESAREAN SECTION;  Surgeon: Lahoma Crocker, MD;  Location: [facility];  Service: Gynecology;  Laterality: N/A;    Family History  Problem Relation Age of Onset  . Diabetes Mother   . Colon cancer Neg Hx   . Esophageal cancer Neg Hx   . Rectal cancer Neg Hx   . Stomach cancer Neg Hx   . Other Son     Beta Thalassemia trait (minor)    Social History  Substance Use Topics  . Smoking status: Never Smoker   . Smokeless tobacco: Never Used  . Alcohol Use: No    Allergies: No Known Allergies  Prescriptions prior to admission  Medication Sig Dispense Refill Last Dose  . acetaminophen (TYLENOL) 500 MG tablet Take 1 tablet (500 mg total) by mouth every 6 (six) hours as needed for moderate pain. (Patient taking differently: Take 500 mg by mouth every 6 (six) hours as needed for mild pain or moderate pain. ) 30 tablet 0 Past Week at Unknown time  . ferrous sulfate 325 (65 FE) MG tablet Take 325 mg by mouth daily with breakfast.   06/07/2016 at Unknown time  .  Prenatal Vit-Fe Fumarate-FA (PRENATAL VITAMIN PO) Take 1 tablet by mouth daily.    06/07/2016 at Unknown time  . cyclobenzaprine (FLEXERIL) 5 MG tablet Take 1 tablet (5 mg total) by mouth 3 (three) times daily as needed for muscle spasms. (Patient not taking: Reported on 06/08/2016) 30 tablet 0     Review of Systems  Constitutional: Negative.   Gastrointestinal: Positive for abdominal pain. Negative for nausea, vomiting, diarrhea and constipation.  Genitourinary: Negative.    Physical Exam   Blood pressure 115/62, pulse 88, temperature 98.5 F (36.9 C), temperature source Oral, resp. rate 16, last menstrual period 10/11/2015.  Physical Exam  Nursing note and vitals reviewed. Constitutional: She is oriented to person, place, and time. She appears well-developed and well-nourished. No distress.  HENT:  Head: Normocephalic and atraumatic.  Eyes: Conjunctivae are normal. Right eye exhibits no discharge. Left eye exhibits no discharge. No scleral icterus.  Neck: Normal range of motion.  Cardiovascular: Normal rate, regular rhythm and normal heart sounds.   No murmur heard. Respiratory: Effort normal and breath sounds normal. No respiratory distress. She has no wheezes.  GI: Soft. There is no tenderness.  Neurological: She is alert and  oriented to person, place, and time.  Skin: Skin is warm and dry. She is not diaphoretic.  Psychiatric: She has a normal mood and affect. Her behavior is normal. Judgment and thought content normal.   Dilation: Closed Effacement (%): Thick Cervical Position: Posterior Station: -3 Exam by:: Jorje Guild NP  Fetal Tracing:  Baseline: 130 Variability: moderate Accelerations: 15x15 Decelerations: none  Toco: x1   MAU Course  Procedures Results for orders placed or performed during the hospital encounter of 06/08/16 (from the past 24 hour(s))  Urinalysis, Routine w reflex microscopic (not at [facility])     Status: Abnormal   Collection Time: 06/08/16   7:25 AM  Result Value Ref Range   Color, Urine YELLOW YELLOW   APPearance HAZY (A) CLEAR   Specific Gravity, Urine 1.010 1.005 - 1.030   pH 5.5 5.0 - 8.0   Glucose, UA NEGATIVE NEGATIVE mg/dL   Hgb urine dipstick NEGATIVE NEGATIVE   Bilirubin Urine NEGATIVE NEGATIVE   Ketones, ur 15 (A) NEGATIVE mg/dL   Protein, ur NEGATIVE NEGATIVE mg/dL   Nitrite NEGATIVE NEGATIVE   Leukocytes, UA TRACE (A) NEGATIVE  Urine microscopic-add on     Status: Abnormal   Collection Time: 06/08/16  7:25 AM  Result Value Ref Range   Squamous Epithelial / LPF 6-30 (A) NONE SEEN   WBC, UA 0-5 0 - 5 WBC/hpf   RBC / HPF NONE SEEN 0 - 5 RBC/hpf   Bacteria, UA FEW (A) NONE SEEN    MDM Reactive tracing Cervix closed VSS & NAD  Assessment and Plan  A: 1. Pain of round ligament affecting pregnancy, antepartum     P: Discharge home Recommend maternity support belt -- paper rx given for possible insurance coverage Increase water intake Discussed reasons to return to MAU Keep f/u with ob  Jorje Guild 06/08/2016, 8:01 AM

## 2016-06-08 NOTE — Discharge Instructions (Signed)
Round Ligament Pain The round ligament is a cord of muscle and tissue that helps to support the uterus. It can become a source of pain during pregnancy if it becomes stretched or twisted as the baby grows. The pain usually begins in the second trimester of pregnancy, and it can come and go until the baby is delivered. It is not a serious problem, and it does not cause harm to the baby. Round ligament pain is usually a short, sharp, and pinching pain, but it can also be a dull, lingering, and aching pain. The pain is felt in the lower side of the abdomen or in the groin. It usually starts deep in the groin and moves up to the outside of the hip area. Pain can occur with:  A sudden change in position.  Rolling over in bed.  Coughing or sneezing.  Physical activity. HOME CARE INSTRUCTIONS Watch your condition for any changes. Take these steps to help with your pain:  When the pain starts, relax. Then try:  Sitting down.  Flexing your knees up to your abdomen.  Lying on your side with one pillow under your abdomen and another pillow between your legs.  Sitting in a warm bath for 15-20 minutes or until the pain goes away.  Take over-the-counter and prescription medicines only as told by your health care provider.  Move slowly when you sit and stand.  Avoid long walks if they cause pain.  Stop or lessen your physical activities if they cause pain. SEEK MEDICAL CARE IF:  Your pain does not go away with treatment.  You feel pain in your back that you did not have before.  Your medicine is not helping. SEEK IMMEDIATE MEDICAL CARE IF:  You develop a fever or chills.  You develop uterine contractions.  You develop vaginal bleeding.  You develop nausea or vomiting.  You develop diarrhea.  You have pain when you urinate.   This information is not intended to replace advice given to you by your health care provider. Make sure you discuss any questions you have with your health  care provider.   Document Released: 09/14/2008 Document Revised: 02/28/2012 Document Reviewed: 02/12/2015 Elsevier Interactive Patient Education [employer].

## 2016-06-09 ENCOUNTER — Encounter (HOSPITAL_COMMUNITY): Payer: Self-pay | Admitting: *Deleted

## 2016-06-10 LAB — OB RESULTS CONSOLE GBS: GBS: NEGATIVE

## 2016-06-28 ENCOUNTER — Inpatient Hospital Stay (HOSPITAL_COMMUNITY): Payer: Medicaid Other | Admitting: Anesthesiology

## 2016-06-28 ENCOUNTER — Encounter (HOSPITAL_COMMUNITY): Payer: Self-pay

## 2016-06-28 ENCOUNTER — Encounter (HOSPITAL_COMMUNITY)
Admission: AD | Disposition: A | Discharge status: Home / Self Care | Payer: Self-pay | Source: Ambulatory Visit | Attending: Obstetrics & Gynecology

## 2016-06-28 ENCOUNTER — Inpatient Hospital Stay (HOSPITAL_COMMUNITY)
Admission: AD | Admit: 2016-06-28 | Discharge: 2016-06-30 | DRG: 766 | Disposition: A | Discharge status: Home / Self Care | Payer: Medicaid Other | Source: Ambulatory Visit | Attending: Obstetrics & Gynecology | Admitting: Obstetrics & Gynecology

## 2016-06-28 DIAGNOSIS — J45909 Unspecified asthma, uncomplicated: Secondary | ICD-10-CM | POA: Diagnosis present

## 2016-06-28 DIAGNOSIS — Z3A38 38 weeks gestation of pregnancy: Secondary | ICD-10-CM

## 2016-06-28 DIAGNOSIS — O34211 Maternal care for low transverse scar from previous cesarean delivery: Secondary | ICD-10-CM | POA: Diagnosis present

## 2016-06-28 DIAGNOSIS — Z833 Family history of diabetes mellitus: Secondary | ICD-10-CM | POA: Diagnosis not present

## 2016-06-28 DIAGNOSIS — O34219 Maternal care for unspecified type scar from previous cesarean delivery: Secondary | ICD-10-CM

## 2016-06-28 DIAGNOSIS — O9952 Diseases of the respiratory system complicating childbirth: Secondary | ICD-10-CM | POA: Diagnosis present

## 2016-06-28 DIAGNOSIS — K219 Gastro-esophageal reflux disease without esophagitis: Secondary | ICD-10-CM | POA: Diagnosis present

## 2016-06-28 DIAGNOSIS — Z3490 Encounter for supervision of normal pregnancy, unspecified, unspecified trimester: Secondary | ICD-10-CM

## 2016-06-28 DIAGNOSIS — O9962 Diseases of the digestive system complicating childbirth: Secondary | ICD-10-CM | POA: Diagnosis present

## 2016-06-28 DIAGNOSIS — Z832 Family history of diseases of the blood and blood-forming organs and certain disorders involving the immune mechanism: Secondary | ICD-10-CM

## 2016-06-28 LAB — ABO/RH: ABO/RH(D): B POS

## 2016-06-28 LAB — CBC
HCT: 36.4 % (ref 36.0–46.0)
Hemoglobin: 12 g/dL (ref 12.0–15.0)
MCH: 21.6 pg — AB (ref 26.0–34.0)
MCHC: 33 g/dL (ref 30.0–36.0)
MCV: 65.6 fL — AB (ref 78.0–100.0)
PLATELETS: 169 10*3/uL (ref 150–400)
RBC: 5.55 MIL/uL — ABNORMAL HIGH (ref 3.87–5.11)
RDW: 17.4 % — AB (ref 11.5–15.5)
WBC: 7.3 10*3/uL (ref 4.0–10.5)

## 2016-06-28 LAB — PREPARE RBC (CROSSMATCH)

## 2016-06-28 SURGERY — Surgical Case
Anesthesia: Spinal

## 2016-06-28 MED ORDER — DIPHENHYDRAMINE HCL 25 MG PO CAPS: 25.0000 mg | ORAL_CAPSULE | Freq: Four times a day (QID) | ORAL | Status: DC | PRN

## 2016-06-28 MED ORDER — MEPERIDINE HCL 25 MG/ML IJ SOLN
INTRAMUSCULAR | Status: AC
  Filled 2016-06-28: qty 1

## 2016-06-28 MED ORDER — CEFAZOLIN SODIUM-DEXTROSE 2-4 GM/100ML-% IV SOLN
INTRAVENOUS | Status: AC
  Filled 2016-06-28: qty 100

## 2016-06-28 MED ORDER — MISOPROSTOL 200 MCG PO TABS: 1000.0000 ug | ORAL_TABLET | Freq: Once | ORAL | Status: DC

## 2016-06-28 MED ORDER — MORPHINE SULFATE (PF) 0.5 MG/ML IJ SOLN
INTRAMUSCULAR | Status: AC
  Filled 2016-06-28: qty 10

## 2016-06-28 MED ORDER — NALBUPHINE HCL 10 MG/ML IJ SOLN: 5.0000 mg | INTRAMUSCULAR | Status: DC | PRN

## 2016-06-28 MED ORDER — FLEET ENEMA 7-19 GM/118ML RE ENEM: 1.0000 | ENEMA | RECTAL | Status: DC | PRN

## 2016-06-28 MED ORDER — OXYCODONE-ACETAMINOPHEN 5-325 MG PO TABS: 2.0000 | ORAL_TABLET | ORAL | Status: DC | PRN

## 2016-06-28 MED ORDER — PROMETHAZINE HCL 25 MG/ML IJ SOLN: 6.2500 mg | INTRAMUSCULAR | Status: DC | PRN

## 2016-06-28 MED ORDER — SODIUM BICARBONATE 8.4 % IV SOLN
INTRAVENOUS | Status: AC
  Filled 2016-06-28: qty 50

## 2016-06-28 MED ORDER — SCOPOLAMINE 1 MG/3DAYS TD PT72
MEDICATED_PATCH | TRANSDERMAL | Status: DC | PRN
  Administered 2016-06-28: 1 via TRANSDERMAL

## 2016-06-28 MED ORDER — TETANUS-DIPHTH-ACELL PERTUSSIS 5-2.5-18.5 LF-MCG/0.5 IM SUSP: 0.5000 mL | Freq: Once | INTRAMUSCULAR | Status: DC

## 2016-06-28 MED ORDER — NALBUPHINE HCL 10 MG/ML IJ SOLN: 5.0000 mg | Freq: Once | INTRAMUSCULAR | Status: DC | PRN

## 2016-06-28 MED ORDER — SODIUM CHLORIDE 0.9 % IV SOLN: Freq: Once | INTRAVENOUS | Status: DC

## 2016-06-28 MED ORDER — SOD CITRATE-CITRIC ACID 500-334 MG/5ML PO SOLN
30.0000 mL | ORAL | Status: DC | PRN
  Administered 2016-06-28: 30 mL via ORAL
  Filled 2016-06-28: qty 15

## 2016-06-28 MED ORDER — PHENYLEPHRINE 8 MG IN D5W 100 ML (0.08MG/ML) PREMIX OPTIME
INJECTION | INTRAVENOUS | Status: AC
  Filled 2016-06-28: qty 100

## 2016-06-28 MED ORDER — ACETAMINOPHEN 500 MG PO TABS
1000.0000 mg | ORAL_TABLET | Freq: Four times a day (QID) | ORAL | Status: AC
  Administered 2016-06-28 – 2016-06-29 (×2): 1000 mg via ORAL
  Filled 2016-06-28 (×3): qty 2

## 2016-06-28 MED ORDER — DIPHENHYDRAMINE HCL 25 MG PO CAPS: 25.0000 mg | ORAL_CAPSULE | ORAL | Status: DC | PRN

## 2016-06-28 MED ORDER — COCONUT OIL OIL
1.0000 "application " | TOPICAL_OIL | Status: DC | PRN
  Administered 2016-06-30: 1 via TOPICAL
  Filled 2016-06-28: qty 120

## 2016-06-28 MED ORDER — MISOPROSTOL 200 MCG PO TABS
ORAL_TABLET | ORAL | Status: AC
  Filled 2016-06-28: qty 5

## 2016-06-28 MED ORDER — ONDANSETRON HCL 4 MG/2ML IJ SOLN
INTRAMUSCULAR | Status: AC
  Filled 2016-06-28: qty 2

## 2016-06-28 MED ORDER — NALOXONE HCL 2 MG/2ML IJ SOSY: 1.0000 ug/kg/h | PREFILLED_SYRINGE | INTRAVENOUS | Status: DC | PRN

## 2016-06-28 MED ORDER — WITCH HAZEL-GLYCERIN EX PADS: 1.0000 "application " | MEDICATED_PAD | CUTANEOUS | Status: DC | PRN

## 2016-06-28 MED ORDER — BUPIVACAINE HCL (PF) 0.5 % IJ SOLN
INTRAMUSCULAR | Status: AC
  Filled 2016-06-28: qty 30

## 2016-06-28 MED ORDER — SENNOSIDES-DOCUSATE SODIUM 8.6-50 MG PO TABS
2.0000 | ORAL_TABLET | ORAL | Status: DC
  Administered 2016-06-28 – 2016-06-29 (×2): 2 via ORAL
  Filled 2016-06-28 (×2): qty 2

## 2016-06-28 MED ORDER — ONDANSETRON HCL 4 MG/2ML IJ SOLN
INTRAMUSCULAR | Status: DC | PRN
Start: 2016-06-28 — End: 2016-06-28
  Administered 2016-06-28: 4 mg via INTRAVENOUS

## 2016-06-28 MED ORDER — OXYTOCIN 10 UNIT/ML IJ SOLN
INTRAMUSCULAR | Status: AC
  Filled 2016-06-28: qty 4

## 2016-06-28 MED ORDER — OXYTOCIN 40 UNITS IN LACTATED RINGERS INFUSION - SIMPLE MED: 2.5000 [IU]/h | INTRAVENOUS | Status: AC

## 2016-06-28 MED ORDER — NALOXONE HCL 0.4 MG/ML IJ SOLN: 0.4000 mg | INTRAMUSCULAR | Status: DC | PRN

## 2016-06-28 MED ORDER — ONDANSETRON HCL 4 MG/2ML IJ SOLN: 4.0000 mg | Freq: Four times a day (QID) | INTRAMUSCULAR | Status: DC | PRN

## 2016-06-28 MED ORDER — DIBUCAINE 1 % RE OINT: 1.0000 "application " | TOPICAL_OINTMENT | RECTAL | Status: DC | PRN

## 2016-06-28 MED ORDER — OXYCODONE-ACETAMINOPHEN 5-325 MG PO TABS
1.0000 | ORAL_TABLET | ORAL | Status: DC | PRN
  Administered 2016-06-29 – 2016-06-30 (×4): 1 via ORAL
  Filled 2016-06-28 (×3): qty 1

## 2016-06-28 MED ORDER — ACETAMINOPHEN 325 MG PO TABS: 650.0000 mg | ORAL_TABLET | ORAL | Status: DC | PRN

## 2016-06-28 MED ORDER — IBUPROFEN 600 MG PO TABS
600.0000 mg | ORAL_TABLET | Freq: Four times a day (QID) | ORAL | Status: DC
  Administered 2016-06-28 – 2016-06-30 (×7): 600 mg via ORAL
  Filled 2016-06-28 (×7): qty 1

## 2016-06-28 MED ORDER — MENTHOL 3 MG MT LOZG: 1.0000 | LOZENGE | OROMUCOSAL | Status: DC | PRN

## 2016-06-28 MED ORDER — OXYTOCIN 40 UNITS IN LACTATED RINGERS INFUSION - SIMPLE MED: 2.5000 [IU]/h | INTRAVENOUS | Status: DC

## 2016-06-28 MED ORDER — SCOPOLAMINE 1 MG/3DAYS TD PT72
1.0000 | MEDICATED_PATCH | Freq: Once | TRANSDERMAL | Status: DC
  Administered 2016-06-29: 1.5 mg via TRANSDERMAL

## 2016-06-28 MED ORDER — PHENYLEPHRINE 8 MG IN D5W 100 ML (0.08MG/ML) PREMIX OPTIME
INJECTION | INTRAVENOUS | Status: DC | PRN
  Administered 2016-06-28: 60 ug/min via INTRAVENOUS

## 2016-06-28 MED ORDER — MEPERIDINE HCL 25 MG/ML IJ SOLN: 6.2500 mg | INTRAMUSCULAR | Status: DC | PRN

## 2016-06-28 MED ORDER — SIMETHICONE 80 MG PO CHEW: 80.0000 mg | CHEWABLE_TABLET | ORAL | Status: DC | PRN

## 2016-06-28 MED ORDER — OXYCODONE-ACETAMINOPHEN 5-325 MG PO TABS: 1.0000 | ORAL_TABLET | ORAL | Status: DC | PRN

## 2016-06-28 MED ORDER — LACTATED RINGERS IV SOLN
INTRAVENOUS | Status: DC
  Administered 2016-06-29: 05:00:00 via INTRAVENOUS

## 2016-06-28 MED ORDER — LIDOCAINE HCL (PF) 1 % IJ SOLN: 30.0000 mL | INTRAMUSCULAR | Status: DC | PRN

## 2016-06-28 MED ORDER — DIPHENHYDRAMINE HCL 50 MG/ML IJ SOLN: 12.5000 mg | INTRAMUSCULAR | Status: DC | PRN

## 2016-06-28 MED ORDER — OXYTOCIN BOLUS FROM INFUSION: 500.0000 mL | INTRAVENOUS | Status: DC

## 2016-06-28 MED ORDER — ZOLPIDEM TARTRATE 5 MG PO TABS: 5.0000 mg | ORAL_TABLET | Freq: Every evening | ORAL | Status: DC | PRN

## 2016-06-28 MED ORDER — LACTATED RINGERS IV SOLN: 500.0000 mL | INTRAVENOUS | Status: DC | PRN

## 2016-06-28 MED ORDER — OXYTOCIN 10 UNIT/ML IJ SOLN
40.0000 [IU] | INTRAVENOUS | Status: DC | PRN
  Administered 2016-06-28: 40 [IU] via INTRAVENOUS

## 2016-06-28 MED ORDER — MORPHINE SULFATE (PF) 0.5 MG/ML IJ SOLN
INTRAMUSCULAR | Status: DC | PRN
  Administered 2016-06-28: .2 mg via EPIDURAL

## 2016-06-28 MED ORDER — CEFAZOLIN SODIUM-DEXTROSE 2-3 GM-% IV SOLR
INTRAVENOUS | Status: DC | PRN
  Administered 2016-06-28: 2 g via INTRAVENOUS

## 2016-06-28 MED ORDER — FENTANYL CITRATE (PF) 100 MCG/2ML IJ SOLN
INTRAMUSCULAR | Status: DC | PRN
  Administered 2016-06-28: 20 ug via INTRAVENOUS

## 2016-06-28 MED ORDER — LACTATED RINGERS IV SOLN
INTRAVENOUS | Status: DC
  Administered 2016-06-28 (×2): via INTRAVENOUS

## 2016-06-28 MED ORDER — FENTANYL CITRATE (PF) 100 MCG/2ML IJ SOLN
INTRAMUSCULAR | Status: AC
  Filled 2016-06-28: qty 2

## 2016-06-28 MED ORDER — BUPIVACAINE IN DEXTROSE 0.75-8.25 % IT SOLN
INTRATHECAL | Status: DC | PRN
  Administered 2016-06-28: 1.6 mL via INTRATHECAL

## 2016-06-28 MED ORDER — SODIUM CHLORIDE 0.9% FLUSH: 3.0000 mL | INTRAVENOUS | Status: DC | PRN

## 2016-06-28 MED ORDER — BUPIVACAINE HCL (PF) 0.5 % IJ SOLN
INTRAMUSCULAR | Status: DC | PRN
  Administered 2016-06-28: 30 mL

## 2016-06-28 MED ORDER — LIDOCAINE-EPINEPHRINE (PF) 2 %-1:200000 IJ SOLN
INTRAMUSCULAR | Status: AC
  Filled 2016-06-28: qty 20

## 2016-06-28 MED ORDER — PRENATAL MULTIVITAMIN CH
1.0000 | ORAL_TABLET | Freq: Every day | ORAL | Status: DC
  Administered 2016-06-29: 1 via ORAL
  Filled 2016-06-28: qty 1

## 2016-06-28 MED ORDER — SIMETHICONE 80 MG PO CHEW
80.0000 mg | CHEWABLE_TABLET | ORAL | Status: DC
  Administered 2016-06-28 – 2016-06-29 (×2): 80 mg via ORAL
  Filled 2016-06-28 (×2): qty 1

## 2016-06-28 MED ORDER — LACTATED RINGERS IV SOLN
INTRAVENOUS | Status: DC | PRN
  Administered 2016-06-28: 19:00:00 via INTRAVENOUS

## 2016-06-28 MED ORDER — SCOPOLAMINE 1 MG/3DAYS TD PT72
MEDICATED_PATCH | TRANSDERMAL | Status: AC
  Filled 2016-06-28: qty 1

## 2016-06-28 MED ORDER — ONDANSETRON HCL 4 MG/2ML IJ SOLN: 4.0000 mg | Freq: Three times a day (TID) | INTRAMUSCULAR | Status: DC | PRN

## 2016-06-28 MED ORDER — MEASLES, MUMPS & RUBELLA VAC ~~LOC~~ INJ: 0.5000 mL | INJECTION | Freq: Once | SUBCUTANEOUS | Status: DC

## 2016-06-28 MED ORDER — FENTANYL CITRATE (PF) 100 MCG/2ML IJ SOLN
25.0000 ug | INTRAMUSCULAR | Status: DC | PRN
  Administered 2016-06-28: 25 ug via INTRAVENOUS

## 2016-06-28 MED ORDER — MEPERIDINE HCL 25 MG/ML IJ SOLN
6.2500 mg | INTRAMUSCULAR | Status: DC | PRN
  Administered 2016-06-28: 6.25 mg via INTRAVENOUS

## 2016-06-28 MED ORDER — SIMETHICONE 80 MG PO CHEW
80.0000 mg | CHEWABLE_TABLET | Freq: Three times a day (TID) | ORAL | Status: DC
  Administered 2016-06-29 – 2016-06-30 (×4): 80 mg via ORAL
  Filled 2016-06-28 (×4): qty 1

## 2016-06-28 SURGICAL SUPPLY — 35 items
BENZOIN TINCTURE PRP APPL 2/3 (GAUZE/BANDAGES/DRESSINGS) ×3 IMPLANT
BLADE TIP J-PLASMA PRECISE LAP (MISCELLANEOUS) ×3 IMPLANT
CLAMP CORD UMBIL (MISCELLANEOUS) IMPLANT
CLOSURE STERI STRIP 1/2 X4 (GAUZE/BANDAGES/DRESSINGS) ×3 IMPLANT
CLOTH BEACON ORANGE TIMEOUT ST (SAFETY) ×3 IMPLANT
DRSG OPSITE POSTOP 4X10 (GAUZE/BANDAGES/DRESSINGS) ×3 IMPLANT
DURAPREP 26ML APPLICATOR (WOUND CARE) ×3 IMPLANT
ELECT REM PT RETURN 9FT ADLT (ELECTROSURGICAL) ×3
ELECTRODE REM PT RTRN 9FT ADLT (ELECTROSURGICAL) ×1 IMPLANT
EXTRACTOR VACUUM KIWI (MISCELLANEOUS) IMPLANT
GLOVE BIO SURGEON STRL SZ7 (GLOVE) ×3 IMPLANT
GLOVE BIOGEL PI IND STRL 7.0 (GLOVE) ×2 IMPLANT
GLOVE BIOGEL PI INDICATOR 7.0 (GLOVE) ×4
GOWN STRL REUS W/TWL LRG LVL3 (GOWN DISPOSABLE) ×6 IMPLANT
GOWN STRL REUS W/TWL XL LVL3 (GOWN DISPOSABLE) ×3 IMPLANT
KIT ABG SYR 3ML LUER SLIP (SYRINGE) IMPLANT
NEEDLE HYPO 22GX1.5 SAFETY (NEEDLE) ×3 IMPLANT
NEEDLE HYPO 25X5/8 SAFETYGLIDE (NEEDLE) IMPLANT
NS IRRIG 1000ML POUR BTL (IV SOLUTION) ×3 IMPLANT
PACK C SECTION WH (CUSTOM PROCEDURE TRAY) ×3 IMPLANT
PAD OB MATERNITY 4.3X12.25 (PERSONAL CARE ITEMS) ×3 IMPLANT
PENCIL SMOKE EVAC W/HOLSTER (ELECTROSURGICAL) ×3 IMPLANT
RTRCTR C-SECT PINK 25CM LRG (MISCELLANEOUS) IMPLANT
SPONGE SURGIFOAM ABS GEL 12-7 (HEMOSTASIS) IMPLANT
STRIP CLOSURE SKIN 1/2X4 (GAUZE/BANDAGES/DRESSINGS) ×2 IMPLANT
SUT PDS AB 0 CTX 60 (SUTURE) IMPLANT
SUT PLAIN 0 NONE (SUTURE) IMPLANT
SUT SILK 0 TIES 10X30 (SUTURE) IMPLANT
SUT VIC AB 0 CT1 36 (SUTURE) ×9 IMPLANT
SUT VIC AB 3-0 CT1 27 (SUTURE) ×2
SUT VIC AB 3-0 CT1 TAPERPNT 27 (SUTURE) ×1 IMPLANT
SUT VIC AB 4-0 KS 27 (SUTURE) IMPLANT
SYR CONTROL 10ML LL (SYRINGE) ×3 IMPLANT
TOWEL OR 17X24 6PK STRL BLUE (TOWEL DISPOSABLE) ×3 IMPLANT
TRAY FOLEY CATH SILVER 14FR (SET/KITS/TRAYS/PACK) ×3 IMPLANT

## 2016-06-28 NOTE — Anesthesia Preprocedure Evaluation (Addendum)
Anesthesia Evaluation  Patient identified by MRN, date of birth, ID band Patient awake    Reviewed: Allergy & Precautions, NPO status , Patient's Chart, lab work & pertinent test results  Airway Mallampati: II   Neck ROM: Full    Dental  (+) Dental Advisory Given, Teeth Intact   Pulmonary neg pulmonary ROS, asthma ,    breath sounds clear to auscultation       Cardiovascular negative cardio ROS   Rhythm:Regular     Neuro/Psych negative neurological ROS  negative psych ROS   GI/Hepatic negative GI ROS, Neg liver ROS, GERD  ,  Endo/Other  negative endocrine ROS  Renal/GU negative Renal ROS  negative genitourinary   Musculoskeletal negative musculoskeletal ROS (+)   Abdominal   Peds negative pediatric ROS (+)  Hematology negative hematology ROS (+) 12/36  plts 169   Anesthesia Other Findings   Reproductive/Obstetrics negative OB ROS CS X 2 in past                            Anesthesia Physical Anesthesia Plan  ASA: II and emergent  Anesthesia Plan: Spinal   Post-op Pain Management:    Induction:   Airway Management Planned: Nasal Cannula and Natural Airway  Additional Equipment:   Intra-op Plan:   Post-operative Plan:   Informed Consent: I have reviewed the patients History and Physical, chart, labs and discussed the procedure including the risks, benefits and alternatives for the proposed anesthesia with the patient or authorized representative who has indicated his/her understanding and acceptance.     Plan Discussed with:   Anesthesia Plan Comments: (2 IV please, T & C for 2, has hx of bleeding in past)        Anesthesia Quick Evaluation

## 2016-06-28 NOTE — MAU Note (Signed)
Contractions started at 1000. Getting closer and stronger. No bleeding or leaking

## 2016-06-28 NOTE — Transfer of Care (Signed)
Immediate Anesthesia Transfer of Care Note  Patient: Teresa [name]  Procedure(s) Performed: Procedure(s): CESAREAN SECTION (N/A)  Patient Location: PACU  Anesthesia Type:Spinal  Level of Consciousness: awake, alert , oriented and patient cooperative  Airway & Oxygen Therapy: Patient Spontanous Breathing  Post-op Assessment: Report given to RN and Post -op Vital signs reviewed and stable  Post vital signs: Reviewed and stable  Last Vitals:  Filed Vitals:   06/28/16 1723 06/28/16 1738  BP: 120/63   Pulse: 102   Temp:  36.9 C  Resp:  20    Last Pain:  Filed Vitals:   06/28/16 1814  PainSc: 6          Complications: No apparent anesthesia complications

## 2016-06-28 NOTE — Anesthesia Postprocedure Evaluation (Incomplete)
Anesthesia Post Note  Patient: Teresa [name]  Procedure(s) Performed: Procedure(s) (LRB): CESAREAN SECTION (N/A)  Anesthesia Post Evaluation   Last Vitals:  Filed Vitals:   06/28/16 1723 06/28/16 1738  BP: 120/63   Pulse: 102   Temp:  36.9 C  Resp:  20    Last Pain:  Filed Vitals:   06/28/16 1814  PainSc: 6    Pain Goal:                 Gilmer Mor

## 2016-06-28 NOTE — Op Note (Signed)
06/28/2016  7:40 PM  PATIENT:  Teresa [name]  35 y.o. female  PRE-OPERATIVE DIAGNOSIS:  prior c-section in labor  POST-OPERATIVE DIAGNOSIS:  Repeat Cesarean Section; spontaneous onset of labor at 38 4/7/ weeks   PROCEDURE:  Procedure(s): CESAREAN SECTION (N/A)  SURGEON:  Surgeon(s) and Role:    * Willodean Rosenthalarolyn Harraway-Smith, MD - Primary  ANESTHESIA:   spinal  EBL:  Total I/O In: 2300 [I.V.:2300] Out: 1200 [Urine:200; Blood:1000]  BLOOD ADMINISTERED:none  DRAINS: none   LOCAL MEDICATIONS USED:  MARCAINE     SPECIMEN:  Source of Specimen:  placenta  DISPOSITION OF SPECIMEN:  PATHOLOGY  COUNTS:  YES  TOURNIQUET:  * No tourniquets in log *  DICTATION: .Note written in EPIC  PLAN OF CARE: Admit to inpatient   PATIENT DISPOSITION:  PACU - hemodynamically stable.   Delay start of Pharmacological VTE agent (>24hrs) due to surgical blood loss or risk of bleeding: yes  Complications: none  INDICATIONS: Teresa [name] is a [age] y.o. [id number] at 7252w4d here for cesarean section secondary to the indications listed under preoperative diagnosis; please see preoperative note for further details.  The risks of cesarean section were discussed with the patient including but were not limited to: bleeding which may require transfusion or reoperation; infection which may require antibiotics; injury to bowel, bladder, ureters or other surrounding organs; injury to the fetus; need for additional procedures including hysterectomy in the event of a life-threatening hemorrhage; placental abnormalities wth subsequent pregnancies, incisional problems, thromboembolic phenomenon and other postoperative/anesthesia complications.   The patient concurred with the proposed plan, giving informed written consent for the procedure.    FINDINGS:  Viable female infant in cephalic presentation.  Apgars pending.  Clear amniotic fluid.  Intact placenta, three vessel cord.  Nuchal cord x 1. Normal uterus, fallopian  tubes and ovaries bilaterally. (of note: pts chart revealed a h/o a hydrosalpinx.  There was no hydrosalpinx noted).  PROCEDURE IN DETAIL:  The patient preoperatively received intravenous antibiotics and had sequential compression devices applied to her lower extremities.  She was then taken to the operating room where spinal anesthesia was administered and was found to be adequate. She was then placed in a dorsal supine position with a leftward tilt, and prepped and draped in a sterile manner.  A foley catheter was placed into her bladder and attached to constant gravity.  After an adequate timeout was performed, a Pfannenstiel skin incision was made with scalpel and carried through to the underlying layer of fascia. This incision was made 2 cm above the patients prior incision. The fascia was incised in the midline, and this incision was extended bilaterally using the Mayo scissors.  Kocher clamps were applied to the superior aspect of the fascial incision and the underlying rectus muscles were dissected off bluntly. A similar process was carried out on the inferior aspect of the fascial incision. The rectus muscles were separated in the midline bluntly and the peritoneum was entered bluntly.  Attention was turned to the lower uterine segment where a low transverse hysterotomy incision was made with a scalpel and extended bilaterally bluntly.  The infant was successfully delivered, there wa a nuchal cord x 1. The cord was clamped and cut after a 1 min pause and the infant was handed over to awaiting neonatology team. The placenta was delivered manually. Uterine massage was then administered.  The placenta was intact with a three-vessel cord. The uterus was then cleared of clot and debris.  The hysterotomy was closed with  0 Vicryl in a running locked fashion, and an imbricating layer was also placed with the same suture. The uterus was returned to the pelvis. The pelvis was cleared of all clot and debris.  Hemostasis was confirmed on all surfaces.  The peritoneum and the muscles were reapproximated using 0 Vicryl with 1 interrupted suture. The fascia was then closed using 0 Vicryl.  The subcutaneous layer was irrigated and the skin was closed with a 4-0 Vicryl subcuticular stitch.  30 cc of 0.5% marcaine was injected into the incision and benzoin and steristrips were applied.  The patient tolerated the procedure well. Sponge, lap, instrument and needle counts were correct x 2.  She was taken to the recovery room in stable condition.   Lindy Pennisi L. Harraway-Smith, M.D., Cherlynn June

## 2016-06-28 NOTE — H&P (Signed)
Obstetric Preoperative History and Physical  Teresa [name] is a 35 y.o. [id number] with IUP at [redacted]w[redacted]d presenting for presenting in active labor.  Pt with a history of 2 prior cesareans. She report contractions every 2 minutes.  No acute concerns.   Prenatal Course Source of Care: [facility]  with onset of care at 16 weeks Pregnancy complications or risks: Patient Active Problem List   Diagnosis Date Noted  . Pregnancy 06/28/2016  . Family history of beta thalassemia 01/15/2016  . [redacted] weeks gestation of pregnancy   . Irregular menstrual cycle 05/25/2013  . Dyspepsia and other specified disorders of function of stomach 06/16/2012  . Unspecified constipation 06/16/2012  . Esophageal reflux 06/16/2012   She plans to breastfeed She desires no method for postpartum contraception.   Prenatal labs and studies: ABO, Rh:  B+ Antibody:  neg Rubella:  Imm RPR:   NR HBsAg:   Neg HIV:   Neg GBS: neg 1 hr Glucola  156 normal 3 hor GTT Genetic screening not done Anatomy US normal  Prenatal Transfer Tool  Maternal Diabetes: No Genetic Screening: Declined Maternal Ultrasounds/Referrals: Declined Fetal Ultrasounds or other Referrals:  None Maternal Substance Abuse:  No Significant Maternal Medications:  None Significant Maternal Lab Results: None  Past Medical History  Diagnosis Date  . Asthma     Ventolin  . GERD (gastroesophageal reflux disease)   . Ulcer   . Anemia   . Arthritis     Past Surgical History  Procedure Laterality Date  . Cesarean section    . Cesarean section  04/06/2012    Procedure: CESAREAN SECTION;  Surgeon: Lahoma Crocker, MD;  Location: [facility];  Service: Gynecology;  Laterality: N/A;    OB History  Gravida Para Term Preterm AB SAB TAB Ectopic Multiple Living  3 2 2       2     # Outcome Date GA Lbr Len/2nd Weight Sex Delivery Anes PTL Lv  3 Current           2 Term 04/06/12 [redacted]w[redacted]d  7 lb 5.5 oz (3.33 kg) M CS-LTranv Spinal  Y  1 Term 02/06/10 [redacted]w[redacted]d   M  CS-Unspec   Y     Comments: Fetal Distress      Social History   Social History  . Marital Status: Married    Spouse Name: N/A  . Number of Children: 2  . Years of Education: N/A   Occupational History  . housewife    Social History Main Topics  . Smoking status: Never Smoker   . Smokeless tobacco: Never Used  . Alcohol Use: No  . Drug Use: No  . Sexual Activity:    Partners: Male    Birth Control/ Protection: Implant   Other Topics Concern  . None   Social History Narrative    Family History  Problem Relation Age of Onset  . Diabetes Mother   . Colon cancer Neg Hx   . Esophageal cancer Neg Hx   . Rectal cancer Neg Hx   . Stomach cancer Neg Hx   . Other Son     Beta Thalassemia trait (minor)    Prescriptions prior to admission  Medication Sig Dispense Refill Last Dose  . albuterol (PROVENTIL HFA;VENTOLIN HFA) 108 (90 Base) MCG/ACT inhaler Inhale 1-2 puffs into the lungs every 6 (six) hours as needed for wheezing or shortness of breath.   2 Month at Unknown time  . ferrous sulfate 325 (65 FE) MG tablet Take 325 mg  by mouth 3 (three) times daily.    06/28/2016 at Unknown time  . Prenatal Vit-Fe Fumarate-FA (PRENATAL VITAMIN PO) Take 1 tablet by mouth daily.    06/28/2016 at Unknown time  . acetaminophen (TYLENOL) 500 MG tablet Take 1 tablet (500 mg total) by mouth every 6 (six) hours as needed for moderate pain. (Patient not taking: Reported on 06/26/2016) 30 tablet 0 Not Taking at Unknown time  . cyclobenzaprine (FLEXERIL) 5 MG tablet Take 1 tablet (5 mg total) by mouth 3 (three) times daily as needed for muscle spasms. (Patient not taking: Reported on 06/08/2016) 30 tablet 0 Not Taking at Unknown time    No Known Allergies  Review of Systems: Negative except for what is mentioned in HPI.  Physical Exam: BP 112/64 mmHg  Pulse 90  Temp(Src) 98.4 F (36.9 C) (Oral)  Resp 20  Ht 5\' 5"  (1.651 m)  Wt 158 lb (71.668 kg)  BMI 26.29 kg/m2  LMP 10/11/2015 FHR by  Doppler: 130's bpm CONSTITUTIONAL: Well-developed, well-nourished female in no acute distress.  HENT:  Normocephalic, atraumatic, External right and left ear normal. Oropharynx is clear and moist EYES: Conjunctivae and EOM are normal. Pupils are equal, round, and reactive to light. No scleral icterus.  NECK: Normal range of motion, supple, no masses SKIN: Skin is warm and dry. No rash noted. Not diaphoretic. No erythema. No pallor. [place]: Alert and oriented to person, place, and time. Normal reflexes, muscle tone coordination. No cranial nerve deficit noted. PSYCHIATRIC: Normal mood and affect. Normal behavior. Normal judgment and thought content. CARDIOVASCULAR: Normal heart rate noted, regular rhythm RESPIRATORY: Effort and breath sounds normal, no problems with respiration noted ABDOMEN: Soft, nontender, nondistended, gravid. Well-healed Pfannenstiel incision. Reg ctx PELVIC: 3 cm.  MUSCULOSKELETAL: Normal range of motion. No edema and no tenderness. 2+ distal pulses.   Pertinent Labs/Studies:   Labs pending  Assessment and Plan :Teresa [name] is a 35 y.o. G3P2002 at [redacted]w[redacted]d being admitted being admitted in active labor for repeat cesarean section. The risks of cesarean section discussed with the patient included but were not limited to: bleeding which may require transfusion or reoperation; infection which may require antibiotics; injury to bowel, bladder, ureters or other surrounding organs; injury to the fetus; need for additional procedures including hysterectomy in the event of a life-threatening hemorrhage; placental abnormalities wth subsequent pregnancies, incisional problems, thromboembolic phenomenon and other postoperative/anesthesia complications. The patient concurred with the proposed plan, giving informed written consent for the procedure. Patient has been NPO since last night she will remain NPO for procedure. Anesthesia and OR aware. Preoperative prophylactic antibiotics  and SCDs ordered on call to the OR. To OR when ready.    Teresa [name] L. Ihor Dow, MD, [place]  Attending [place], [facility]

## 2016-06-28 NOTE — Brief Op Note (Signed)
06/28/2016  7:40 PM  PATIENT:  Teresa [name]  35 y.o. female  PRE-OPERATIVE DIAGNOSIS:  prior c-section in labor  POST-OPERATIVE DIAGNOSIS:  Repeat Cesarean Section; spontaneous onset of labor at 38 4/7/ weeks   PROCEDURE:  Procedure(s): CESAREAN SECTION (N/A)  SURGEON:  Surgeon(s) and Role:    * Lavonia Drafts, MD - Primary  ANESTHESIA:   spinal  EBL:  Total I/O In: 2300 [I.V.:2300] Out: 1200 [Urine:200; Blood:1000]  BLOOD ADMINISTERED:none  DRAINS: none   LOCAL MEDICATIONS USED:  MARCAINE     SPECIMEN:  Source of Specimen:  placenta  DISPOSITION OF SPECIMEN:  PATHOLOGY  COUNTS:  YES  TOURNIQUET:  * No tourniquets in log *  DICTATION: .Note written in EPIC  PLAN OF CARE: Admit to inpatient   PATIENT DISPOSITION:  PACU - hemodynamically stable.   Delay start of Pharmacological VTE agent (>24hrs) due to surgical blood loss or risk of bleeding: yes  Complications: none  Peggy Monk L. Harraway-Smith, M.D., [place]nn June

## 2016-06-28 NOTE — Anesthesia Procedure Notes (Signed)
Spinal Patient location during procedure: OR Start time: 06/28/2016 6:35 PM End time: 06/28/2016 6:43 PM Staffing Anesthesiologist: Alexis Frock Preanesthetic Checklist Completed: patient identified, site marked, surgical consent, pre-op evaluation, timeout performed, IV checked, risks and benefits discussed and monitors and equipment checked Spinal Block Patient position: sitting Prep: DuraPrep Patient monitoring: heart rate, continuous pulse ox, blood pressure and cardiac monitor Approach: midline Location: L4-5 Injection technique: single-shot Needle Needle type: Whitacre and Introducer  Needle gauge: 24 G Needle length: 9 cm Needle insertion depth: 5 cm Additional Notes Negative paresthesia. Negative blood return. Positive free-flowing CSF. Expiration date of kit checked and confirmed. Patient tolerated procedure well, without complications.

## 2016-06-28 NOTE — Anesthesia Postprocedure Evaluation (Signed)
Anesthesia Post Note  Patient: Teresa [name]  Procedure(s) Performed: Procedure(s) (LRB): CESAREAN SECTION (N/A)  Patient location during evaluation: PACU Anesthesia Type: Spinal Level of consciousness: oriented and awake and alert Pain management: pain level controlled Vital Signs Assessment: post-procedure vital signs reviewed and stable Respiratory status: spontaneous breathing and respiratory function stable Cardiovascular status: blood pressure returned to baseline and stable Postop Assessment: no headache, no backache and spinal receding Anesthetic complications: no     Last Vitals:  Filed Vitals:   06/28/16 2030 06/28/16 2045  BP: 106/77 129/58  Pulse: 79 155  Temp: 36.4 C   Resp: 16 15    Last Pain:  Filed Vitals:   06/28/16 2056  PainSc: 5    Pain Goal:                 Alexis Frock

## 2016-06-29 ENCOUNTER — Encounter (HOSPITAL_COMMUNITY): Payer: Self-pay | Admitting: Obstetrics & Gynecology

## 2016-06-29 LAB — CBC
HCT: 28.2 % — ABNORMAL LOW (ref 36.0–46.0)
HEMOGLOBIN: 9.3 g/dL — AB (ref 12.0–15.0)
MCH: 21.6 pg — AB (ref 26.0–34.0)
MCHC: 33 g/dL (ref 30.0–36.0)
MCV: 65.4 fL — AB (ref 78.0–100.0)
PLATELETS: 138 10*3/uL — AB (ref 150–400)
RBC: 4.31 MIL/uL (ref 3.87–5.11)
RDW: 17.1 % — ABNORMAL HIGH (ref 11.5–15.5)
WBC: 8.6 10*3/uL (ref 4.0–10.5)

## 2016-06-29 LAB — RPR: RPR Ser Ql: NONREACTIVE

## 2016-06-29 MED ORDER — PNEUMOCOCCAL VAC POLYVALENT 25 MCG/0.5ML IJ INJ
0.5000 mL | INJECTION | INTRAMUSCULAR | Status: AC
  Administered 2016-06-30: 0.5 mL via INTRAMUSCULAR
  Filled 2016-06-29: qty 0.5

## 2016-06-29 NOTE — Anesthesia Postprocedure Evaluation (Signed)
Anesthesia Post Note  Patient: Teresa [name]  Procedure(s) Performed: Procedure(s) (LRB): CESAREAN SECTION (N/A)  Patient location during evaluation: Mother Baby Anesthesia Type: Spinal Level of consciousness: oriented and awake and alert Pain management: pain level controlled Vital Signs Assessment: post-procedure vital signs reviewed and stable Respiratory status: spontaneous breathing and nonlabored ventilation Cardiovascular status: stable Postop Assessment: spinal receding, patient able to bend at knees, adequate PO intake and no signs of nausea or vomiting Anesthetic complications: no     Last Vitals:  Filed Vitals:   06/29/16 0200 06/29/16 0602  BP:  97/59  Pulse:  81  Temp: 36.7 C 36.8 C  Resp: 18 18    Last Pain:  Filed Vitals:   06/29/16 0605  PainSc: 0-No pain   Pain Goal:                 [employer]

## 2016-06-29 NOTE — Progress Notes (Signed)
UR chart review completed.  

## 2016-06-29 NOTE — Addendum Note (Signed)
Addendum  created 06/29/16 0747 by Hewitt Blade, CRNA   Modules edited: Clinical Notes   Clinical Notes:  File: [id number]

## 2016-06-29 NOTE — Progress Notes (Cosign Needed)
Post Op Day 1 Subjective: no complaints, up ad lib, tolerating PO and + flatus  Objective: Blood pressure 97/59, pulse 81, temperature 98.3 F (36.8 C), temperature source Oral, resp. rate 18, height 5\' 5"  (1.651 m), weight 71.668 kg (158 lb), last menstrual period 10/11/2015, SpO2 97 %, unknown if currently breastfeeding.  Physical Exam:  General: alert, cooperative and no distress Uterine Fundus: firm Incision: healing well. Dried blood on dressing.  DVT Evaluation: No significant calf/ankle edema. Pt using SCDs.   Recent Labs  06/28/16 1800 06/29/16 0514  HGB 12.0 9.3*  HCT 36.4 28.2*    Assessment/Plan: S/p Cesarean section. Doing well. Continue care. Contraception patch   LOS: 1 day   Teresa [name] 06/29/2016, 7:21 AM

## 2016-06-29 NOTE — Lactation Note (Addendum)
This note was copied from a baby's chart. Lactation Consultation Note Experienced BF mom of 1 yr and 9 months to her 33 yr old and 53 yr old. Mom wants to breast and formula. Hand expression taught w/no colostrum noted. Mom speaks great [place]. Has good everted nipples. Mom encouraged to feed baby 8-12 times/24 hours and with feeding cues. Referred to Baby and Me Book in Breastfeeding section Pg. 22-23 for position options and Proper latch demonstration.[place] brochure given w/resources, support groups and [place] services. Alimentum given w/slow flow nipples and feeding instruction sheet.  Patient Name: Teresa [name] [id number] Date: 06/29/2016 Reason for consult: Initial assessment   Maternal Data Has patient been taught Hand Expression?: Yes  Feeding    LATCH Score/Interventions       Type of Nipple: Everted at rest and after stimulation  Comfort (Breast/Nipple): Soft / non-tender     Intervention(s): Breastfeeding basics reviewed;Support Pillows     Lactation Tools Discussed/Used WIC Program: Yes   Consult Status Consult Status: Follow-up Date: 06/30/16 Follow-up type: In-patient    Theodoro Kalata 06/29/2016, 7:49 AM

## 2016-06-29 NOTE — Progress Notes (Signed)
Subjective: Postpartum Day 1: Cesarean Delivery Patient reports tolerating PO.    Objective: Vital signs in last 24 hours: Temp:  [97.3 F (36.3 C)-98.9 F (37.2 C)] 98.3 F (36.8 C) (07/11 0602) Pulse Rate:  [70-155] 81 (07/11 0602) Resp:  [15-20] 18 (07/11 0602) BP: (97-129)/(53-84) 97/59 mmHg (07/11 0602) SpO2:  [89 %-100 %] 97 % (07/11 0602) Weight:  [156 lb 12.8 oz (71.124 kg)-158 lb (71.668 kg)] 158 lb (71.668 kg) (07/10 1738)  Physical Exam:  General: alert, cooperative and no distress Lochia: appropriate Uterine Fundus: firm Incision: healing well, no significant drainage DVT Evaluation: No evidence of DVT seen on physical exam.   Recent Labs  06/28/16 1800 06/29/16 0514  HGB 12.0 9.3*  HCT 36.4 28.2*    Assessment/Plan: Status post Cesarean section. Doing well postoperatively.  Continue current care.  [facility] 06/29/2016, 6:27 AM

## 2016-06-30 LAB — BIRTH TISSUE RECOVERY COLLECTION (PLACENTA DONATION)

## 2016-06-30 MED ORDER — IBUPROFEN 600 MG PO TABS: 600.0000 mg | ORAL_TABLET | Freq: Four times a day (QID) | ORAL | Status: DC | PRN

## 2016-06-30 MED ORDER — OXYCODONE-ACETAMINOPHEN 5-325 MG PO TABS
1.0000 | ORAL_TABLET | ORAL | Status: DC | PRN
Start: 2016-06-30 — End: 2017-03-17

## 2016-06-30 NOTE — Discharge Summary (Signed)
OB Discharge Summary     Patient Name: Teresa [name] DOB: 12-07-1981 MRN: [id number]  Date of admission: 06/28/2016 Delivering MD: Lavonia Drafts   Date of discharge: 06/30/2016  Admitting diagnosis: 38w ctx every 5 min Intrauterine pregnancy: [redacted]w[redacted]d     Secondary diagnosis:  Active Problems:   Pregnancy  Additional problems: none     Discharge diagnosis: Term Pregnancy Delivered                                                                                                Post partum procedures:none  Augmentation: none  Complications: None  Hospital course:  Onset of Labor With Unplanned C/S  35 y.o. yo G3P3003 at [redacted]w[redacted]d was admitted in [place] on 06/28/2016. Patient had 2 prior C/S. Membrane Rupture Time/Date: 7:05 PM ,06/28/2016   The patient went for cesarean section due to Prior Uterine Surgery, and delivered a Viable infant,06/28/2016  Details of operation can be found in separate operative note. Patient had an uncomplicated postpartum course.  She is ambulating,tolerating a regular diet, passing flatus, and urinating well.  Patient is discharged home in stable condition 06/30/2016.  Physical exam  Filed Vitals:   06/29/16 0921 06/29/16 1330 06/29/16 1900 06/30/16 0700  BP: 106/47 108/47 99/55 106/56  Pulse: 80 66 87 92  Temp: 98.3 F (36.8 C) 98 F (36.7 C) 97.9 F (36.6 C) 97.9 F (36.6 C)  TempSrc: Oral Oral Oral Oral  Resp: 18 18 18 18   Height:      Weight:      SpO2: 97% 100%     General: alert, cooperative and no distress Lochia: appropriate Uterine Fundus: firm Incision: Dressing is clean, dry, and intact DVT Evaluation: No evidence of DVT seen on physical exam. No significant calf/ankle edema. Labs: Lab Results  Component Value Date   WBC 8.6 06/29/2016   HGB 9.3* 06/29/2016   HCT 28.2* 06/29/2016   MCV 65.4* 06/29/2016   PLT 138* 06/29/2016   CMP Latest Ref Rng 07/26/2013  Glucose 70 - 99 mg/dL 105(H)  BUN 6 - 23 mg/dL 10   Creatinine 0.50 - 1.10 mg/dL 0.60  Sodium 135 - 145 mEq/L 137  Potassium 3.5 - 5.1 mEq/L 3.6  Chloride 96 - 112 mEq/L 104  CO2 19 - 32 mEq/L 20  Calcium 8.4 - 10.5 mg/dL 9.5  Total Protein 6.0 - 8.3 g/dL 7.6  Total Bilirubin 0.3 - 1.2 mg/dL 0.6  Alkaline Phos 39 - 117 U/L 89  AST 0 - 37 U/L 13  ALT 0 - 35 U/L 11    Discharge instruction: per After Visit Summary and "Baby and Me Booklet".  After visit meds:    Medication List    ASK your doctor about these medications        acetaminophen 500 MG tablet  Commonly known as:  TYLENOL  Take 1 tablet (500 mg total) by mouth every 6 (six) hours as needed for moderate pain.     albuterol 108 (90 Base) MCG/ACT inhaler  Commonly known as:  PROVENTIL HFA;VENTOLIN HFA  Inhale 1-2 puffs into the lungs every  6 (six) hours as needed for wheezing or shortness of breath.     cyclobenzaprine 5 MG tablet  Commonly known as:  FLEXERIL  Take 1 tablet (5 mg total) by mouth 3 (three) times daily as needed for muscle spasms.     ferrous sulfate 325 (65 FE) MG tablet  Take 325 mg by mouth 3 (three) times daily.     PRENATAL VITAMIN PO  Take 1 tablet by mouth daily.        Diet: routine diet  Activity: Advance as tolerated. Pelvic rest for 6 weeks.   Outpatient follow up:6 weeks Follow up Appt:No future appointments. Follow up Visit:No Follow-up on file.  Postpartum contraception: patch  Newborn Data: Live born female  Birth Weight: 7 lb 6.3 oz (3355 g) APGAR: 8, 9  Baby Feeding: Breast Disposition:home with mother   06/30/2016 Ralene Ok, MD  OB FELLOW DISCHARGE ATTESTATION  I have seen and examined this patient and agree with above documentation in the resident's note.   Desma Maxim, MD 5:51 PM

## 2016-06-30 NOTE — Discharge Instructions (Signed)
Iron-Rich Diet Iron is a mineral that helps your body to produce hemoglobin. Hemoglobin is a protein in your red blood cells that carries oxygen to your body's tissues. Eating too little iron may cause you to feel weak and tired, and it can increase your risk for infection. Eating enough iron is necessary for your body's metabolism, muscle function, and nervous system. Iron is naturally found in many foods. It can also be added to foods or fortified in foods. There are two types of dietary iron:  Heme iron. Heme iron is absorbed by the body more easily than nonheme iron. Heme iron is found in meat, poultry, and fish.  Nonheme iron. Nonheme iron is found in dietary supplements, iron-fortified grains, beans, and vegetables. You may need to follow an iron-rich diet if:  You have been diagnosed with iron deficiency or iron-deficiency anemia.  You have a condition that prevents you from absorbing dietary iron, such as:  Infection in your intestines.  Celiac disease. This involves long-lasting (chronic) inflammation of your intestines.  You do not eat enough iron.  You eat a diet that is high in foods that impair iron absorption.  You have lost a lot of blood.  You have heavy bleeding during your menstrual cycle.  You are pregnant. WHAT IS MY PLAN? Your health care provider may help you to determine how much iron you need per day based on your condition. Generally, when a person consumes sufficient amounts of iron in the diet, the following iron needs are met:  Men.  14-18 years old: 11 mg per day.  19-50 years old: 8 mg per day.  Women.   14-18 years old: 15 mg per day.  19-50 years old: 18 mg per day.  Over 50 years old: 8 mg per day.  Pregnant women: 27 mg per day.  Breastfeeding women: 9 mg per day. WHAT DO I NEED TO KNOW ABOUT AN IRON-RICH DIET?  Eat fresh fruits and vegetables that are high in vitamin C along with foods that are high in iron. This will help increase  the amount of iron that your body absorbs from food, especially with foods containing nonheme iron. Foods that are high in vitamin C include oranges, peppers, tomatoes, and mango.  Take iron supplements only as directed by your health care provider. Overdose of iron can be life-threatening. If you were prescribed iron supplements, take them with orange juice or a vitamin C supplement.  Cook foods in pots and pans that are made from iron.   Eat nonheme iron-containing foods alongside foods that are high in heme iron. This helps to improve your iron absorption.   Certain foods and drinks contain compounds that impair iron absorption. Avoid eating these foods in the same meal as iron-rich foods or with iron supplements. These include:  Coffee, black tea, and red wine.  Milk, dairy products, and foods that are high in calcium.  Beans, soybeans, and peas.  Whole grains.  When eating foods that contain both nonheme iron and compounds that impair iron absorption, follow these tips to absorb iron better.   Soak beans overnight before cooking.  Soak whole grains overnight and drain them before using.  Ferment flours before baking, such as using yeast in bread dough. WHAT FOODS CAN I EAT? Grains Iron-fortified breakfast cereal. Iron-fortified whole-wheat bread. Enriched rice. Sprouted grains. Vegetables Spinach. Potatoes with skin. Green peas. Broccoli. Red and green bell peppers. Fermented vegetables. Fruits Prunes. Raisins. Oranges. Strawberries. Mango. Grapefruit. Meats and Other Protein   Sources Beef liver. Oysters. Beef. Shrimp. Turkey. Chicken. Tuna. Sardines. Chickpeas. Nuts. Tofu. Beverages Tomato juice. Fresh orange juice. Prune juice. Hibiscus tea. Fortified instant breakfast shakes. Condiments Tahini. Fermented soy sauce. Sweets and Desserts Black-strap molasses.  Other Wheat germ. The items listed above may not be a complete list of recommended foods or  beverages. Contact your dietitian for more options. WHAT FOODS ARE NOT RECOMMENDED? Grains Whole grains. Bran cereal. Bran flour. Oats. Vegetables Artichokes. Brussels sprouts. Kale. Fruits Blueberries. Raspberries. Strawberries. Figs. Meats and Other Protein Sources Soybeans. Products made from soy protein. Dairy Milk. Cream. Cheese. Yogurt. Cottage cheese. Beverages Coffee. Black tea. Red wine. Sweets and Desserts Cocoa. Chocolate. Ice cream. Other Basil. Oregano. Parsley. The items listed above may not be a complete list of foods and beverages to avoid. Contact your dietitian for more information.   This information is not intended to replace advice given to you by your health care provider. Make sure you discuss any questions you have with your health care provider.   Document Released: 07/20/2005 Document Revised: 12/27/2014 Document Reviewed: 07/03/2014 Elsevier Interactive Patient Education 2016 Elsevier Inc.  

## 2016-07-01 ENCOUNTER — Encounter (HOSPITAL_COMMUNITY): Payer: Self-pay | Admitting: *Deleted

## 2016-07-02 LAB — TYPE AND SCREEN
ABO/RH(D): B POS
ANTIBODY SCREEN: NEGATIVE
Unit division: 0
Unit division: 0

## 2016-07-09 ENCOUNTER — Inpatient Hospital Stay (HOSPITAL_COMMUNITY)
Admission: RE | Admit: 2016-07-09 | Payer: Medicaid Other | Source: Ambulatory Visit | Admitting: Obstetrics & Gynecology

## 2016-07-09 ENCOUNTER — Encounter (HOSPITAL_COMMUNITY): Admission: RE | Payer: Self-pay | Source: Ambulatory Visit

## 2016-07-09 SURGERY — Surgical Case
Anesthesia: Regional

## 2017-03-16 NOTE — Progress Notes (Signed)
CC: palpitations  HPI: Teresa [name] is a 36 y.o. female with PMH significant for GERD , diabetes, and childhood asthma who presents to [facility] today to establish care.  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 - Achy pain, intermittent - Lasts about 10 minutes, improves with the drink of water - No palliating or aggravating factors identified - started <1 month ago, occurs ~every other day, no similar episodes in the past - not associated with activity or meals - bothers her most at night when sleeping - No associated diaphoresis or dyspnea - No family history of heart disease, no patient history of heart disease, mom had hypertension and diabetes  Palpitations - First noted <1 month ago - every day/ every other day, most commonly at night, sometimes associated with above chest pain - No prior episodes in the past - No lightheadedness or dizziness - No hot/cold episodes - No constipation or diarrhea - No noted changes in hair  Vaginal itching/foul smelling urine - noticed this week - took intravaginal medication for yeast infection from [place] - foul-smelling urine noted - no burning with urination, no hematuria, +vaginal discharge - c/w previous yeast infection s/s - no fevers  Meds: Takes no meds Allergies: none   PMH: - preDiabetes - A1c elevated to 6.3 two years ago - Asthma - uses albuterol inhaler has not needed in 7 months - Kidney problems as a child, unsure laterality with 2 month hospital stay in [place] as a child, possibly one kidney did not work - GERD  PSH:  - 3 C-sections  Family History: mother with diabetes, asthma, HTN Social: Never smoker, no recreational drug use, no alcohol use. Lives at home with her husband and two sons 7 years, 4 years and 8 months and her two nieces  Review of  Symptoms:  See HPI for ROS.   CC, SH/smoking status, and VS noted.  Objective: BP 102/68   Pulse 83   Temp 98.7 F (37.1 C) (Oral)   Ht 5\' 5"  (1.651 m)   Wt 148 lb 12.8 oz (67.5 kg)   SpO2 99%   Breastfeeding? Yes   BMI 24.76 kg/m  GEN: NAD, alert, cooperative, and pleasant. EYE: no conjunctival injection, pupils equally round and reactive to light ENMT: normal tympanic light reflex, no nasal polyps,no rhinorrhea, no pharyngeal erythema or exudates NECK: full ROM, no thyromegaly RESPIRATORY: clear to auscultation bilaterally with no wheezes, rhonchi or rales, good effort CV: RRR, no m/r/g, no peripheral edema CHEST: +tenderness to palpation over right chest and shoulder and right back GI: soft, non-tender, non-distended, normoactive bowel sounds, no hepatosplenomegaly, no CVA tenderness SKIN: warm and dry, no rashes or lesions NEURO: II-XII grossly intact, normal gait, peripheral sensation intact PSYCH: AAOx3, appropriate affect  EKG: NSR, no ST-T changes from previous (2014), questionable V2 T wave inversion c/w previous study Read by me, precepted with Dr. Gwendlyn Deutscher  Assessment and plan:  Palpitations - uncertain etiology - ECG WNL/unchanged - ordered TSH, CBC, BMP - follow up 2 weeks -red flags/return precautions advised - if continues consider cards referral/holter  History of prediabetes - HbA1c ordered today - patient notes numbness in arms and legs at times which may be related to diabetes but will also order B12 - f/u 1-2 weeks  Vaginal itching Concern for yeast infection. Patient unable to give urine sample  in office to r/o UTI - diflucan x1 - follow up If symptoms fail to improve or resolve  chest pain Most likely musculoskeletal based on chest wall tenderness on exam. No red flags. Less likely GERD with no relation to food, but can consider this if symptoms fail to improve. Low concern for cardiogenic cause at this time, however return precautions advised at  length. - recommend NSAIDS - EKG reassuring   Orders Placed This Encounter  Procedures  . CBC  . TSH  . Vitamin B12  . Basic metabolic panel  . POCT glycosylated hemoglobin (Hb A1C)  . EKG 12-Lead    Meds ordered this encounter  Medications  . fluconazole (DIFLUCAN) 150 MG tablet    Sig: Take 1 tablet (150 mg total) by mouth once.    Dispense:  1 tablet    Refill:  0    Everrett Coombe, MD,MS,  PGY1 03/17/2017 5:49 PM

## 2017-03-17 ENCOUNTER — Encounter: Payer: Self-pay | Admitting: Student in an Organized Health Care Education/Training Program

## 2017-03-17 ENCOUNTER — Ambulatory Visit (INDEPENDENT_AMBULATORY_CARE_PROVIDER_SITE_OTHER): Payer: Managed Care, Other (non HMO) | Admitting: Student in an Organized Health Care Education/Training Program

## 2017-03-17 ENCOUNTER — Ambulatory Visit (HOSPITAL_COMMUNITY)
Admission: RE | Admit: 2017-03-17 | Discharge: 2017-03-17 | Disposition: A | Discharge status: Home / Self Care | Payer: Managed Care, Other (non HMO) | Source: Ambulatory Visit | Attending: Obstetrics & Gynecology | Admitting: Obstetrics & Gynecology

## 2017-03-17 VITALS — BP 102/68 | HR 83 | Temp 98.7°F | Ht 65.0 in | Wt 148.8 lb

## 2017-03-17 DIAGNOSIS — R0789 Other chest pain: Secondary | ICD-10-CM | POA: Diagnosis not present

## 2017-03-17 DIAGNOSIS — R002 Palpitations: Secondary | ICD-10-CM | POA: Insufficient documentation

## 2017-03-17 DIAGNOSIS — L298 Other pruritus: Secondary | ICD-10-CM | POA: Diagnosis not present

## 2017-03-17 DIAGNOSIS — N898 Other specified noninflammatory disorders of vagina: Secondary | ICD-10-CM

## 2017-03-17 DIAGNOSIS — Z87898 Personal history of other specified conditions: Secondary | ICD-10-CM

## 2017-03-17 DIAGNOSIS — E119 Type 2 diabetes mellitus without complications: Secondary | ICD-10-CM | POA: Insufficient documentation

## 2017-03-17 HISTORY — DX: Palpitations: R00.2

## 2017-03-17 LAB — POCT GLYCOSYLATED HEMOGLOBIN (HGB A1C): Hemoglobin A1C: 6.1

## 2017-03-17 MED ORDER — FLUCONAZOLE 150 MG PO TABS: 150.0000 mg | ORAL_TABLET | Freq: Once | ORAL | 0 refills | Status: AC

## 2017-03-17 NOTE — Assessment & Plan Note (Signed)
Concern for yeast infection. Patient unable to give urine sample in office to r/o UTI - diflucan x1 - follow up If symptoms fail to improve or resolve

## 2017-03-17 NOTE — Assessment & Plan Note (Signed)
Most likely musculoskeletal based on chest wall tenderness on exam. No red flags. Less likely GERD with no relation to food, but can consider this if symptoms fail to improve. Low concern for cardiogenic cause at this time, however return precautions advised at length. - recommend NSAIDS - EKG reassuring

## 2017-03-17 NOTE — Patient Instructions (Addendum)
It was a pleasure seeing you today in our clinic. Today we discussed your heart racing and chest pain. Here is the treatment plan we have discussed and agreed upon together:  Chest pain/ heart racing - We performed an EKG while you were in our office today, and this appeared normal and unchanged from your previous one in 2014 - We ordered a number of blood tests on today's visit, I will call you with the results of these when they become available - If you do not hear from me within the next week, please call our office for the results - Please schedule a follow-up appointment on your way out today to be seen in 1-2 weeks - If you experience chest pain that does not resolve, feels like an elephant is sitting on your chest, or is associated with shortness of breath, sweating, or dizziness these would be reasons to call our office back or go into the emergency department to be seen sooner.  History of Prediabetes - We ordered blood work today to test for diabetes - We can discuss these results at your follow-up appointment  Urine foul odor and vaginal discharge - I sent a medication for a yeast infection to your pharmacy  Our clinic's number is [phone]. Please call with questions or concerns about what we discussed today.  Be well, Dr. Burr Medico

## 2017-03-17 NOTE — Assessment & Plan Note (Addendum)
-   uncertain etiology - ECG WNL/unchanged - ordered TSH, CBC, BMP - follow up 2 weeks -red flags/return precautions advised - if continues consider cards referral/holter

## 2017-03-17 NOTE — Assessment & Plan Note (Addendum)
-   HbA1c ordered today - patient notes numbness in arms and legs at times which may be related to diabetes but will also order B12 - f/u 1-2 weeks

## 2017-03-18 LAB — BASIC METABOLIC PANEL
BUN/Creatinine Ratio: 13 (ref 9–23)
BUN: 11 mg/dL (ref 6–20)
CALCIUM: 9.6 mg/dL (ref 8.7–10.2)
CHLORIDE: 101 mmol/L (ref 96–106)
CO2: 23 mmol/L (ref 18–29)
Creatinine, Ser: 0.84 mg/dL (ref 0.57–1.00)
GFR calc Af Amer: 106 mL/min/{1.73_m2} (ref >59)
GFR, EST NON AFRICAN AMERICAN: 92 mL/min/{1.73_m2} (ref >59)
Glucose: 94 mg/dL (ref 65–99)
POTASSIUM: 3.9 mmol/L (ref 3.5–5.2)
Sodium: 140 mmol/L (ref 134–144)

## 2017-03-18 LAB — TSH: TSH: 1.84 u[IU]/mL (ref 0.450–4.500)

## 2017-03-18 LAB — CBC
HEMOGLOBIN: 11.5 g/dL (ref 11.1–15.9)
Hematocrit: 35 % (ref 34.0–46.6)
MCH: 20.4 pg — ABNORMAL LOW (ref 26.6–33.0)
MCHC: 32.9 g/dL (ref 31.5–35.7)
MCV: 62 fL — ABNORMAL LOW (ref 79–97)
Platelets: 272 10*3/uL (ref 150–379)
RBC: 5.64 x10E6/uL — AB (ref 3.77–5.28)
RDW: 16.6 % — ABNORMAL HIGH (ref 12.3–15.4)
WBC: 7.1 10*3/uL (ref 3.4–10.8)

## 2017-03-18 LAB — VITAMIN B12: Vitamin B-12: 748 pg/mL (ref 232–1245)

## 2017-03-22 ENCOUNTER — Telehealth: Payer: Self-pay | Admitting: Student in an Organized Health Care Education/Training Program

## 2017-03-22 ENCOUNTER — Encounter: Payer: Self-pay | Admitting: Student in an Organized Health Care Education/Training Program

## 2017-03-22 NOTE — Telephone Encounter (Signed)
Called patient to give recent lab results. Left VM. Will send letter with results.

## 2017-04-20 ENCOUNTER — Encounter: Payer: Self-pay | Admitting: Student in an Organized Health Care Education/Training Program

## 2017-04-20 ENCOUNTER — Ambulatory Visit (INDEPENDENT_AMBULATORY_CARE_PROVIDER_SITE_OTHER): Payer: Managed Care, Other (non HMO) | Admitting: Student in an Organized Health Care Education/Training Program

## 2017-04-20 DIAGNOSIS — Z87898 Personal history of other specified conditions: Secondary | ICD-10-CM

## 2017-04-20 DIAGNOSIS — Z7189 Other specified counseling: Secondary | ICD-10-CM | POA: Diagnosis not present

## 2017-04-20 DIAGNOSIS — Z7184 Encounter for health counseling related to travel: Secondary | ICD-10-CM | POA: Insufficient documentation

## 2017-04-20 NOTE — Patient Instructions (Addendum)
It was a pleasure seeing you today in our clinic. Today we discussed . Here is the treatment plan we have discussed and agreed upon together:  Travel For travel vaccinations and malaria prophylaxis, please go to either the [facility] (([phone]) or the [facility] [phone])  Goals - Walk 30 minutes 3x per week at a vigorous enough pace to get your heart rate up. - try to decrease the portion size of your carbohydrates at lunch and dinner - we discussed my plate, I would try to have half your plate be vegetables at each meal  Our clinic's number is [phone]. Please call with questions or concerns about what we discussed today.  Be well, Dr. Burr Medico

## 2017-04-20 NOTE — Progress Notes (Signed)
   CC: Prediabetes follow up  HPI: Teresa [name] is a 36 y.o. female with PMH significant for GERD , diabetes, and childhood asthma  who presents to [facility] today for prediabetes follow up.  24 hour diet recall Woke 5:30 Breakfast 6:30 - oatmeal, 1 packet drink skim milk Snack: None Lunch: 12:30 - couscous 1 plate, bowl salted spinach steamed, drank water Snack: None Dinner: 5:30 - brown rice 1 plate, tomato sauce with green beans, lamb meat, drink  Dessert: banana  This is a typical Day. Sometimes has more snacks, like snack bar Does not exercise   No polydipsia or polyphasia, no changes in sensation.  Review of Symptoms:  See HPI for ROS.   CC, SH/smoking status, and VS noted.  Objective: BP 102/60   Pulse 94   Temp 98.1 F (36.7 C) (Oral)   Ht 5\' 5"  (1.651 m)   Wt 150 lb 3.2 oz (68.1 kg)   SpO2 98%   BMI 24.99 kg/m  GEN: NAD, alert, cooperative, and pleasant. RESPIRATORY: clear to auscultation bilaterally with no wheezes, rhonchi or rales, good effort CV: RRR, no m/r/g, no peripheral edema SKIN: warm and dry, no rashes or lesions PSYCH: AAOx3, appropriate affect  Last visit B12 WNL BMP WNL TSH WNL CBC no leukocytosis or anemia HbA1c 6.1  Assessment and plan:  History of prediabetes HbA1c last visit 6.1 - discussed prediabetes - did diet history, made recs - used [email or web address] as reference - follow up as needed, can check A1c again in 6 months or one year  Travel advice encounter Patient planning on traveling to [place], needs travel vaccines and malaria ppx - no yellow fever vaccine in our office - referred to ID travel clinic or health department to get both vaccines and malaria pills    Everrett Coombe, MD,MS,  PGY1 04/20/2017 4:56 PM

## 2017-04-20 NOTE — Assessment & Plan Note (Signed)
HbA1c last visit 6.1 - discussed prediabetes - did diet history, made recs - used [email or web address] as reference - follow up as needed, can check A1c again in 6 months or one year

## 2017-04-20 NOTE — Assessment & Plan Note (Signed)
Patient planning on traveling to [place], needs travel vaccines and malaria ppx - no yellow fever vaccine in our office - referred to ID travel clinic or health department to get both vaccines and malaria pills

## 2018-02-06 ENCOUNTER — Ambulatory Visit: Payer: Managed Care, Other (non HMO) | Admitting: Internal Medicine

## 2018-02-06 ENCOUNTER — Other Ambulatory Visit: Payer: Self-pay | Admitting: Family Medicine

## 2018-02-06 ENCOUNTER — Other Ambulatory Visit: Payer: Self-pay

## 2018-02-06 ENCOUNTER — Encounter: Payer: Self-pay | Admitting: Internal Medicine

## 2018-02-06 DIAGNOSIS — J111 Influenza due to unidentified influenza virus with other respiratory manifestations: Secondary | ICD-10-CM | POA: Diagnosis not present

## 2018-02-06 MED ORDER — FLUTICASONE PROPIONATE 50 MCG/ACT NA SUSP: 2.0000 | Freq: Every day | NASAL | 6 refills | Status: DC

## 2018-02-06 MED ORDER — CETIRIZINE HCL 10 MG PO TABS: 10.0000 mg | ORAL_TABLET | Freq: Every day | ORAL | 5 refills | Status: DC

## 2018-02-06 MED ORDER — KETOROLAC TROMETHAMINE 30 MG/ML IJ SOLN: 30.0000 mg | Freq: Once | INTRAMUSCULAR | Status: DC

## 2018-02-06 MED ORDER — KETOROLAC TROMETHAMINE 30 MG/ML IJ SOLN
30.0000 mg | Freq: Once | INTRAMUSCULAR | Status: AC
  Administered 2018-02-06: 30 mg via INTRAMUSCULAR

## 2018-02-06 NOTE — Patient Instructions (Addendum)
Continue taking plenty of fluids.  This is very important for you to feel better.  I want you to use the Flonase that I prescribed 1 spray in your no nose every morning.  When she also takes Zyrtec.  You can use ibuprofen and Tylenol for the muscle aches.  You are feeling worse over the next couple of days return for reevaluation   Influenza, Adult Influenza ("the flu") is an infection in the lungs, nose, and throat (respiratory tract). It is caused by a virus. The flu causes many common cold symptoms, as well as a high fever and body aches. It can make you feel very sick. The flu spreads easily from person to person (is contagious). Getting a flu shot (influenza vaccination) every year is the best way to prevent the flu. Follow these instructions at home:  Take over-the-counter and prescription medicines only as told by your doctor.  Use a cool mist humidifier to add moisture (humidity) to the air in your home. This can make it easier to breathe.  Rest as needed.  Drink enough fluid to keep your pee (urine) clear or pale yellow.  Cover your mouth and nose when you cough or sneeze.  Wash your hands with soap and water often, especially after you cough or sneeze. If you cannot use soap and water, use hand sanitizer.  Stay home from work or school as told by your doctor. Unless you are visiting your doctor, try to avoid leaving home until your fever has been gone for 24 hours without the use of medicine.  Keep all follow-up visits as told by your doctor. This is important. How is this prevented?  Getting a yearly (annual) flu shot is the best way to avoid getting the flu. You may get the flu shot in late summer, fall, or winter. Ask your doctor when you should get your flu shot.  Wash your hands often or use hand sanitizer often.  Avoid contact with people who are sick during cold and flu season.  Eat healthy foods.  Drink plenty of fluids.  Get enough sleep.  Exercise  regularly. Contact a doctor if:  You get new symptoms.  You have: ? Chest pain. ? Watery poop (diarrhea). ? A fever.  Your cough gets worse.  You start to have more mucus.  You feel sick to your stomach (nauseous).  You throw up (vomit). Get help right away if:  You start to be short of breath or have trouble breathing.  Your skin or nails turn a bluish color.  You have very bad pain or stiffness in your neck.  You get a sudden headache.  You get sudden pain in your face or ear.  You cannot stop throwing up. This information is not intended to replace advice given to you by your health care provider. Make sure you discuss any questions you have with your health care provider. Document Released: 09/14/2008 Document Revised: 05/13/2016 Document Reviewed: 09/30/2015 Elsevier Interactive Patient Education  2017 [employer].

## 2018-02-06 NOTE — Progress Notes (Signed)
   [facility] Kerrin Mo, MD Phone: [phone]  Reason For Visit: SDA for Flu symptoms   URI  Patient has been sick since friday. Patient states she has had a headache, body aches, fever for the past couple of days.  She feels miserable.  Patient has congestion. Patient is now developing sore throat. Feels weak. Patient did not get her flu shot. Son was sent home from school for being ill today. Patient went to [place], [place] in August 2018 otherwise no recent travel.  Patient has not been sick previously Medications tried: Tylenol and Ibuprofen, patient has taken Theraflu - patient states these medications do not help at all  Sick contacts: As above  Symptoms Fever: felt warm to the touch for 2 days, none today per patient  Headache or face pain: yes  Tooth pain: No Sneezing: yes Scratchy throat: yes Allergies:  None Muscle aches: yes  Severe fatigue: tired  Stiff neck: none  Shortness of breath: none Rash: none  Sore throat or swollen glands: yes   ROS see HPI Smoking Status noted  Objective: BP 100/62   Pulse 98   Temp 98.6 F (37 C) (Oral)   Ht 5\' 5"  (1.651 m)   Wt 145 lb 6.4 oz (66 kg)   SpO2 99%   BMI 24.20 kg/m  Gen: NAD, alert, cooperative with exam HEENT: Normal    Neck: No masses palpated.  Bilateral lymphadenopathy    Ears: Fluid noted behind the left TM, though no erythema, no no pus   eyes: PERRLA, conjunctive are normal    Nose: Swollen and erythematous turbinates, specifically worse on the left compared to right    Throat: Tonsils without exudate or swelling or erythema, moist mucus membranes, no erythema Cardio: regular rate and rhythm, S1S2 heard, no murmurs appreciated Pulm: clear to auscultation bilaterally, no wheezes, rhonchi or rales GI: soft, non-tender, non-distended, bowel sounds present, no hepatomegaly, no splenomegaly Skin: dry, intact, no rashes or lesions  Assessment/Plan: See problem based  a/p  Influenza Signs and symptoms consistent with the flu.  No recent travel to consider other significant viral illnesses.  Patient currently with a headache likely due to significant congestion and swelling in the sinuses. No requirement for Tamiflu, given 72 hours unlikely to provide any benefit -We will provide patient with a Toradol shot in clinic for this headache -Ibuprofen and Tylenol as needed tomorrow and while ill -Provided patient with Flonase and Zyrtec to help with nasal congestion and swelling -Patient to follow-up if she worsens or the next couple of days -We will check for influenza during workup

## 2018-02-07 LAB — INFLUENZA A AND B
Influenza A Ag, EIA: NEGATIVE
Influenza B Ag, EIA: NEGATIVE

## 2018-02-07 LAB — PLEASE NOTE:

## 2018-02-07 NOTE — Assessment & Plan Note (Addendum)
Signs and symptoms consistent with the flu.  No recent travel to consider other significant viral illnesses.  Patient currently with a headache likely due to significant congestion and swelling in the sinuses. No requirement for Tamiflu, given 72 hours unlikely to provide any benefit -We will provide patient with a Toradol shot in clinic for this headache -Ibuprofen and Tylenol as needed tomorrow and while ill -Provided patient with Flonase and Zyrtec to help with nasal congestion and swelling -Patient to follow-up if she worsens or the next couple of days -We will check for influenza during workup

## 2018-02-23 ENCOUNTER — Ambulatory Visit (INDEPENDENT_AMBULATORY_CARE_PROVIDER_SITE_OTHER): Payer: Managed Care, Other (non HMO) | Admitting: Student in an Organized Health Care Education/Training Program

## 2018-02-23 ENCOUNTER — Other Ambulatory Visit: Payer: Self-pay

## 2018-02-23 ENCOUNTER — Encounter: Payer: Self-pay | Admitting: Student in an Organized Health Care Education/Training Program

## 2018-02-23 DIAGNOSIS — M62838 Other muscle spasm: Secondary | ICD-10-CM | POA: Insufficient documentation

## 2018-02-23 DIAGNOSIS — G44229 Chronic tension-type headache, not intractable: Secondary | ICD-10-CM | POA: Diagnosis not present

## 2018-02-23 HISTORY — DX: Chronic tension-type headache, not intractable: G44.229

## 2018-02-23 MED ORDER — CYCLOBENZAPRINE HCL 5 MG PO TABS: 10.0000 mg | ORAL_TABLET | Freq: Every evening | ORAL | 0 refills | Status: DC | PRN

## 2018-02-23 MED ORDER — IBUPROFEN 600 MG PO TABS: 600.0000 mg | ORAL_TABLET | Freq: Three times a day (TID) | ORAL | 0 refills | Status: DC | PRN

## 2018-02-23 NOTE — Assessment & Plan Note (Addendum)
Uncertain etiology, however reassured patient that this is not likely due to stroke.  Will check a metabolic panel to assess for electrolyte abnormalities, we can also check a B12.  Additionally patient was advised to start a multivitamin. - will check CMP, B12 - start multivitamin

## 2018-02-23 NOTE — Patient Instructions (Signed)
It was a pleasure seeing you today in our clinic. Here is the treatment plan we have discussed and agreed upon together:  Start a multivitamin  Start drinking more water (2 large water bottles per day)  Brainstorm strategies to get fuller nights of sleep  Check your pillow, see if you need to switch it for more neck support  Ibuprofen prescribed for severe headaches (not to use daily), and muscle relaxer to use as needed at night before bed. Do not take muscle relaxer before driving.  We drew blood work at today's visit. I will call or send you a letter with these results. If you do not hear from me within the next week, please give our office a call. Our clinic's number is [phone]. Please call with questions or concerns about what we discussed today.  Be well, Dr. Burr Medico

## 2018-02-23 NOTE — Progress Notes (Signed)
CC: Headaches  HPI: Teresa [name] is a 37 y.o. female who presents to [facility] Today with headache.  HEADACHE  Onset: Started in December.   Location: frontal and back, comes down to the eyes. Some times has tearing of the left eye when the headache is bad.  Quality: patient is unable to express the quality Frequency: Comes on almost every day, lasts 1-3 hours  Precipitating factors: Worse with little sleep.  She reports poor quality sleep most nights.  She has a 67-year-old and another child at home.  Her husband works a shift from 3 PM to 3 AM and frequently sleeps during the day, so she is unable to get good quality of sleep and also does not have good division of labor at home.  Prior treatment: occasional ibuprofen  Occasional caffeine use with coffee, tea, soda. About half-cup coffee per day.  Associated Symptoms Nausea/vomiting: no  Photophobia/phonophobia: no  Tearing of eyes: yes  Sinus pain/pressure: no  Family hx migraine: yes, maternal aunt  Personal stressors: yes, stress with baby who is 61 year old  Relation to menstrual cycle: no, does not bleed regularly - has nexplanon   Red Flags Fever: no  Neck pain/stiffness: yes, neck pain, no stiffness  Vision/speech/swallow/hearing difficulty: no  Focal weakness/numbness: no  Altered mental status: no  Trauma: no  New type of headache: no  Anticoagulant use: no  H/o cancer/HIV/Pregnancy: history of pregnancy, but has nexplanon in now   Muscle Spasms Patient describes muscle spasms which are worse at night.  She reports that she wakes up in the middle the night and both hands are tensed up and it is difficult for her to relax them.  This happens occasionally.  It is very alarming to her when this happens, and she endorses feeling concerned that she may be having a stroke whenever it happens.  The muscle spasms gradually resolve on their own and go away.  Other concerns: Patient expresses other concerns, such as worried  about her liver and her kidney.  She is understanding that we can only address 2-3 issues per clinic visit in order to give all of the issues that time they need.  She is agreeable to making a follow-up appointment to address these further concerns.  Review of Symptoms:  See HPI for ROS.   CC, SH/smoking status, and VS noted.  Objective: BP 94/60   Pulse 82   Temp 98.2 F (36.8 C) (Oral)   Ht 5\' 5"  (1.651 m)   Wt 146 lb (66.2 kg)   SpO2 99%   BMI 24.30 kg/m  GEN: NAD, alert, cooperative, and pleasant. EYE: no conjunctival injection, pupils equally round and reactive to light RESPIRATORY: clear to auscultation bilaterally with no wheezes, rhonchi or rales, good effort CV: RRR, no m/r/g, no peripheral edema MSK: No gross deformity, full ROM, no ttp in upper ext bil NEURO: II-XII grossly intact, normal gait, peripheral sensation intact PSYCH: AAOx3, appropriate affect  Assessment and plan:  Muscle spasm Uncertain etiology, however reassured patient that this is not likely due to stroke.  Will check a metabolic panel to assess for electrolyte abnormalities, we can also check a B12.  Additionally patient was advised to start a multivitamin. - will check CMP, B12 - start multivitamin  Chronic tension headache Symptoms most consistent with tension headache, likely due to poor quality sleep. No red flags on history or exam. Less likely migraine given description of headache and with no photophobia, no nausea. - Ibuprofen 400  mg PRN severe headaches - Flexeril to take at night - increase fluid intake with 2 large water bottles per day - evaluate pillow to ensure good neck support - patient to brainstorm with her husband ways to maximize sleep   Orders Placed This Encounter  Procedures  . Vitamin B12  . Comprehensive metabolic panel    Order Specific Question:   Has the patient fasted?    Answer:   No    Meds ordered this encounter  Medications  . cyclobenzaprine (FLEXERIL) 5  MG tablet    Sig: Take 2 tablets (10 mg total) by mouth at bedtime as needed for muscle spasms (neck tightness).    Dispense:  15 tablet    Refill:  0  . ibuprofen (ADVIL,MOTRIN) 600 MG tablet    Sig: Take 1 tablet (600 mg total) by mouth every 8 (eight) hours as needed (for severe headache, not to be taken daily).    Dispense:  30 tablet    Refill:  0    Everrett Coombe, MD,MS,  PGY2 02/23/2018 11:35 AM

## 2018-02-23 NOTE — Assessment & Plan Note (Addendum)
Symptoms most consistent with tension headache, likely due to poor quality sleep. No red flags on history or exam. Less likely migraine given description of headache and with no photophobia, no nausea. - Ibuprofen 400 mg PRN severe headaches - Flexeril to take at night - increase fluid intake with 2 large water bottles per day - evaluate pillow to ensure good neck support - patient to brainstorm with her husband ways to maximize sleep

## 2018-02-24 ENCOUNTER — Encounter: Payer: Self-pay | Admitting: Student in an Organized Health Care Education/Training Program

## 2018-02-24 LAB — COMPREHENSIVE METABOLIC PANEL
A/G RATIO: 1.8 (ref 1.2–2.2)
ALBUMIN: 4.8 g/dL (ref 3.5–5.5)
ALT: 13 IU/L (ref 0–32)
AST: 13 IU/L (ref 0–40)
Alkaline Phosphatase: 82 IU/L (ref 39–117)
BILIRUBIN TOTAL: 0.5 mg/dL (ref 0.0–1.2)
BUN / CREAT RATIO: 13 (ref 9–23)
BUN: 10 mg/dL (ref 6–20)
CALCIUM: 9.5 mg/dL (ref 8.7–10.2)
CO2: 23 mmol/L (ref 20–29)
Chloride: 103 mmol/L (ref 96–106)
Creatinine, Ser: 0.76 mg/dL (ref 0.57–1.00)
GFR, EST AFRICAN AMERICAN: 118 mL/min/{1.73_m2} (ref >59)
GFR, EST NON AFRICAN AMERICAN: 103 mL/min/{1.73_m2} (ref >59)
GLOBULIN, TOTAL: 2.7 g/dL (ref 1.5–4.5)
Glucose: 98 mg/dL (ref 65–99)
POTASSIUM: 4.4 mmol/L (ref 3.5–5.2)
Sodium: 140 mmol/L (ref 134–144)
TOTAL PROTEIN: 7.5 g/dL (ref 6.0–8.5)

## 2018-02-24 LAB — VITAMIN B12: VITAMIN B 12: 696 pg/mL (ref 232–1245)

## 2018-03-27 ENCOUNTER — Ambulatory Visit (INDEPENDENT_AMBULATORY_CARE_PROVIDER_SITE_OTHER): Payer: Self-pay | Admitting: Family Medicine

## 2018-03-27 ENCOUNTER — Encounter: Payer: Self-pay | Admitting: Family Medicine

## 2018-03-27 ENCOUNTER — Other Ambulatory Visit: Payer: Self-pay

## 2018-03-27 VITALS — BP 100/62 | HR 96 | Temp 99.0°F | Ht 65.0 in | Wt 145.6 lb

## 2018-03-27 DIAGNOSIS — R05 Cough: Secondary | ICD-10-CM

## 2018-03-27 DIAGNOSIS — R059 Cough, unspecified: Secondary | ICD-10-CM

## 2018-03-27 DIAGNOSIS — J45901 Unspecified asthma with (acute) exacerbation: Secondary | ICD-10-CM

## 2018-03-27 MED ORDER — AZITHROMYCIN 250 MG PO TABS: ORAL_TABLET | ORAL | 0 refills | Status: DC

## 2018-03-27 MED ORDER — PREDNISONE 20 MG PO TABS: 40.0000 mg | ORAL_TABLET | Freq: Every day | ORAL | 0 refills | Status: DC

## 2018-03-27 MED ORDER — ALBUTEROL SULFATE (2.5 MG/3ML) 0.083% IN NEBU
2.5000 mg | INHALATION_SOLUTION | Freq: Once | RESPIRATORY_TRACT | Status: AC
  Administered 2018-03-27: 2.5 mg via RESPIRATORY_TRACT

## 2018-03-27 MED ORDER — IPRATROPIUM BROMIDE 0.02 % IN SOLN
0.5000 mg | Freq: Once | RESPIRATORY_TRACT | Status: AC
  Administered 2018-03-27: 0.5 mg via RESPIRATORY_TRACT

## 2018-03-27 NOTE — Patient Instructions (Signed)
I am treating you for an exacerbation of your asthma.  This was brought on by you being sick.  Take the azithromycin as follows: Take 2 pills today and then 1 pill daily after that.    Take the steroids 40 mg (2 pills) once a day for 5 days.  This will also help with your cough.  Use your inhaler every 6 hours as you need it for cough or any wheezing you here.  Come back to see [place] in about 10 days if no improvement; sooner if any worsening.

## 2018-03-27 NOTE — Progress Notes (Signed)
Subjective:    Teresa [name] is a 37 y.o. female who presents to [facility] today for sore throat and cough:  1.  Cough/sore throat:  Present since Wednesday of this week.  Has had fever for several days.  None today. Cough is productive including of blood which started yesterday and today.  Sore throat prevents her from eating and drinking.  Throat very dry.  No congestion or runny nose, all in chest.  Does endorse headache.  Chills at home.  No documented fever.    Son wth similar symptoms.    PMH:   - history of asthma.  She used her inhaler Friday and Saturday night.  This helped with cough.  Cough is worse at night.     ROS as above per HPI.   The following portions of the patient's history were reviewed and updated as appropriate: allergies, current medications, past medical history, family and social history, and problem list. Patient is a nonsmoker.    PMH reviewed.  Past Medical History:  Diagnosis Date  . Anemia   . Arthritis   . Asthma    Ventolin  . GERD (gastroesophageal reflux disease)   . Ulcer    Past Surgical History:  Procedure Laterality Date  . CESAREAN SECTION    . CESAREAN SECTION  04/06/2012   Procedure: CESAREAN SECTION;  Surgeon: Lahoma Crocker, MD;  Location: [facility];  Service: Gynecology;  Laterality: N/A;  . CESAREAN SECTION N/A 06/28/2016   Procedure: CESAREAN SECTION;  Surgeon: Lavonia Drafts, MD;  Location: [facility];  Service: Obstetrics;  Laterality: N/A;    Medications reviewed. Current Outpatient Medications  Medication Sig Dispense Refill  . albuterol (PROVENTIL HFA;VENTOLIN HFA) 108 (90 Base) MCG/ACT inhaler Inhale 1-2 puffs into the lungs every 6 (six) hours as needed for wheezing or shortness of breath.    . cetirizine (ZYRTEC) 10 MG tablet Take 1 tablet (10 mg total) by mouth daily. 30 tablet 5  . cyclobenzaprine (FLEXERIL) 5 MG tablet Take 2 tablets (10 mg total) by mouth at bedtime as needed for muscle spasms (neck  tightness). 15 tablet 0  . fluticasone (FLONASE) 50 MCG/ACT nasal spray Place 2 sprays into both nostrils daily. 16 g 6  . ibuprofen (ADVIL,MOTRIN) 600 MG tablet Take 1 tablet (600 mg total) by mouth every 8 (eight) hours as needed (for severe headache, not to be taken daily). 30 tablet 0   No current facility-administered medications for this visit.      Objective:   Physical Exam BP 100/62   Pulse 96   Temp 99 F (37.2 C) (Oral)   Ht 5\' 5"  (1.651 m)   Wt 145 lb 9.6 oz (66 kg)   SpO2 99%   BMI 24.23 kg/m  Gen:  Alert, cooperative patient who appears stated age in no acute distress.  Vital signs reviewed. HEENT: EOMI,  MMM Cardiac:  Regular rate and rhythm  Pulm:  Wheezing BL lung fields and decreased BS bases BL Abd:  Soft/nondistended/nontender.   Exts: Non edematous BL  LE, warm and well perfused.   Neb treatment:   - Pt received Duonebs x 1 here in clinic.  Wheezing much improved, subjective breathing much better s/p treatment.    Imp/Plan: 1.  Asthma exacerbation:   - no documented history of asthma. However with inhalers at home and history of SOB this time of year. - Appears to be edging towards LRTI.  No focal findings consistent with PNA today.  - Treat as asthma  exacerbation with azithro, Pred, inhaler usage.  - See AVS for instructions   - Recommend PFTs in next 4 - 6 weeks to delineate actual dx of asthma.

## 2018-03-28 ENCOUNTER — Encounter: Payer: Self-pay | Admitting: Family Medicine

## 2018-05-29 ENCOUNTER — Encounter (HOSPITAL_COMMUNITY): Payer: Self-pay

## 2018-06-08 ENCOUNTER — Other Ambulatory Visit (HOSPITAL_COMMUNITY): Payer: Self-pay | Admitting: *Deleted

## 2018-06-08 DIAGNOSIS — N6310 Unspecified lump in the right breast, unspecified quadrant: Secondary | ICD-10-CM

## 2018-06-29 ENCOUNTER — Ambulatory Visit
Admission: RE | Admit: 2018-06-29 | Discharge: 2018-06-29 | Disposition: A | Discharge status: Home / Self Care | Payer: No Typology Code available for payment source | Source: Ambulatory Visit | Attending: Obstetrics and Gynecology | Admitting: Obstetrics and Gynecology

## 2018-06-29 ENCOUNTER — Encounter (HOSPITAL_COMMUNITY): Payer: Self-pay

## 2018-06-29 ENCOUNTER — Ambulatory Visit (HOSPITAL_COMMUNITY)
Admission: RE | Admit: 2018-06-29 | Discharge: 2018-06-29 | Disposition: A | Discharge status: Home / Self Care | Payer: Self-pay | Source: Ambulatory Visit | Attending: Obstetrics and Gynecology | Admitting: Obstetrics and Gynecology

## 2018-06-29 VITALS — BP 125/67 | Ht 65.0 in | Wt 144.2 lb

## 2018-06-29 DIAGNOSIS — N6312 Unspecified lump in the right breast, upper inner quadrant: Secondary | ICD-10-CM

## 2018-06-29 DIAGNOSIS — Z1239 Encounter for other screening for malignant neoplasm of breast: Secondary | ICD-10-CM

## 2018-06-29 DIAGNOSIS — N6310 Unspecified lump in the right breast, unspecified quadrant: Secondary | ICD-10-CM

## 2018-06-29 NOTE — Progress Notes (Signed)
Complaints of right breast lump x 3 months.  Pap Smear: Pap smear not completed today. Last Pap smear was 04/04/2018 at the [facility] and normal. Per patient has no history of an abnormal Pap smear. Last Pap smear result is in Epic.  Physical exam: Breasts Breasts symmetrical. No skin abnormalities bilateral breasts. No nipple retraction bilateral breasts. No nipple discharge bilateral breasts. No lymphadenopathy. No lumps palpated left breast. Palpated a pea sized lump within the right breast at 2 o'clock 8 cm from the nipple. No complaints of pain or tenderness on exam. Referred patient to the [facility] for a diagnostic mammogram and possible right breast ultrasound. Appointment scheduled for Thursday, June 29, 2018 at 1050.        Pelvic/Bimanual No Pap smear completed today since last Pap smear was 04/04/2018. Pap smear not indicated per BCCCP guidelines.   Smoking History: Patient has never smoked.  Patient Navigation: Patient education provided. Access to services provided for patient through B[facility]CP program.   Breast and Cervical Cancer Risk Assessment: Patient has no family history of breast cancer, known genetic mutations, or radiation treatment to the chest before age 50. Patient has no history of cervical dysplasia, immunocompromised, or DES exposure in-utero.  Risk Assessment    Risk Scores      06/29/2018   Last edited by: Loletta Parish, RN   5-year risk: 0.3 %   Lifetime risk: 9.8 %

## 2018-06-29 NOTE — Patient Instructions (Signed)
Explained breast self awareness with Teresa [name]. Patient did not need a Pap smear today due to last Pap smear was 04/04/2018. Let her know BCCCP will cover Pap smears every 3 years unless has a history of abnormal Pap smears. Referred patient to the [facility] for a diagnostic mammogram and possible right breast ultrasound. Appointment scheduled for Thursday, June 29, 2018 at 1050. [name] verbalized understanding.  [place], Arvil Chaco, RN 10:01 AM

## 2018-06-30 ENCOUNTER — Encounter (HOSPITAL_COMMUNITY): Payer: Self-pay | Admitting: *Deleted

## 2018-10-19 ENCOUNTER — Other Ambulatory Visit (HOSPITAL_COMMUNITY): Payer: Self-pay | Admitting: Nurse Practitioner

## 2018-10-19 DIAGNOSIS — O09521 Supervision of elderly multigravida, first trimester: Secondary | ICD-10-CM

## 2018-10-19 DIAGNOSIS — Z3682 Encounter for antenatal screening for nuchal translucency: Secondary | ICD-10-CM

## 2018-10-19 DIAGNOSIS — Z3A13 13 weeks gestation of pregnancy: Secondary | ICD-10-CM

## 2018-10-20 ENCOUNTER — Encounter (HOSPITAL_COMMUNITY): Payer: Self-pay

## 2018-10-24 ENCOUNTER — Encounter (HOSPITAL_COMMUNITY): Payer: Self-pay | Admitting: *Deleted

## 2018-10-26 LAB — CYTOLOGY - PAP: Pap: NEGATIVE

## 2018-10-26 LAB — OB RESULTS CONSOLE ANTIBODY SCREEN: Antibody Screen: NEGATIVE

## 2018-10-26 LAB — OB RESULTS CONSOLE HEPATITIS B SURFACE ANTIGEN: Hepatitis B Surface Ag: NEGATIVE

## 2018-10-26 LAB — OB RESULTS CONSOLE GC/CHLAMYDIA
Chlamydia: NEGATIVE
Gonorrhea: NEGATIVE

## 2018-10-26 LAB — OB RESULTS CONSOLE RUBELLA ANTIBODY, IGM: Rubella: IMMUNE

## 2018-10-26 LAB — OB RESULTS CONSOLE ABO/RH: RH Type: POSITIVE

## 2018-10-26 LAB — OB RESULTS CONSOLE HIV ANTIBODY (ROUTINE TESTING): HIV: NONREACTIVE

## 2018-10-26 LAB — OB RESULTS CONSOLE RPR: RPR: NONREACTIVE

## 2018-10-26 LAB — OB RESULTS CONSOLE HGB/HCT, BLOOD
HEMATOCRIT: 33 (ref 29–41)
Hemoglobin: 10.1

## 2018-10-26 LAB — CULTURE, OB URINE: Urine Culture, OB: NEGATIVE

## 2018-10-26 LAB — OB RESULTS CONSOLE PLATELET COUNT: Platelets: 302

## 2018-10-27 ENCOUNTER — Ambulatory Visit (HOSPITAL_COMMUNITY)
Admission: RE | Admit: 2018-10-27 | Discharge: 2018-10-27 | Disposition: A | Discharge status: Home / Self Care | Payer: Medicaid Other | Source: Ambulatory Visit | Attending: Nurse Practitioner | Admitting: Nurse Practitioner

## 2018-10-27 ENCOUNTER — Ambulatory Visit (HOSPITAL_COMMUNITY): Payer: Self-pay

## 2018-10-27 ENCOUNTER — Other Ambulatory Visit (HOSPITAL_COMMUNITY): Payer: Self-pay | Admitting: *Deleted

## 2018-10-27 ENCOUNTER — Encounter (HOSPITAL_COMMUNITY): Payer: Self-pay

## 2018-10-27 DIAGNOSIS — O09291 Supervision of pregnancy with other poor reproductive or obstetric history, first trimester: Secondary | ICD-10-CM

## 2018-10-27 DIAGNOSIS — Z3682 Encounter for antenatal screening for nuchal translucency: Secondary | ICD-10-CM | POA: Diagnosis not present

## 2018-10-27 DIAGNOSIS — O09529 Supervision of elderly multigravida, unspecified trimester: Secondary | ICD-10-CM

## 2018-10-27 DIAGNOSIS — O09521 Supervision of elderly multigravida, first trimester: Secondary | ICD-10-CM | POA: Diagnosis not present

## 2018-10-27 DIAGNOSIS — Z3A13 13 weeks gestation of pregnancy: Secondary | ICD-10-CM

## 2018-10-27 DIAGNOSIS — O34219 Maternal care for unspecified type scar from previous cesarean delivery: Secondary | ICD-10-CM

## 2018-10-28 ENCOUNTER — Other Ambulatory Visit (HOSPITAL_COMMUNITY): Payer: Self-pay

## 2018-11-08 ENCOUNTER — Telehealth (HOSPITAL_COMMUNITY): Payer: Self-pay | Admitting: *Deleted

## 2018-11-08 NOTE — Telephone Encounter (Signed)
Pt returning call regarding genetic testing results.  Name and DOB verified.  Normal female result given.  Pt very excited. Will return for her detail anatomy scan 12/01/18.

## 2018-11-19 ENCOUNTER — Inpatient Hospital Stay (HOSPITAL_COMMUNITY)
Admission: AD | Admit: 2018-11-19 | Discharge: 2018-11-19 | Disposition: A | Discharge status: Home / Self Care | Payer: 59 | Source: Ambulatory Visit | Attending: Obstetrics & Gynecology | Admitting: Obstetrics & Gynecology

## 2018-11-19 DIAGNOSIS — Z3A16 16 weeks gestation of pregnancy: Secondary | ICD-10-CM | POA: Diagnosis not present

## 2018-11-19 DIAGNOSIS — O99712 Diseases of the skin and subcutaneous tissue complicating pregnancy, second trimester: Secondary | ICD-10-CM | POA: Diagnosis not present

## 2018-11-19 DIAGNOSIS — L259 Unspecified contact dermatitis, unspecified cause: Secondary | ICD-10-CM

## 2018-11-19 DIAGNOSIS — R21 Rash and other nonspecific skin eruption: Secondary | ICD-10-CM | POA: Diagnosis present

## 2018-11-19 DIAGNOSIS — O26892 Other specified pregnancy related conditions, second trimester: Secondary | ICD-10-CM | POA: Diagnosis not present

## 2018-11-19 MED ORDER — TRIAMCINOLONE ACETONIDE 0.1 % EX CREA: 1.0000 "application " | TOPICAL_CREAM | Freq: Two times a day (BID) | CUTANEOUS | 0 refills | Status: AC

## 2018-11-19 NOTE — Discharge Instructions (Signed)
Contact Dermatitis Dermatitis is redness, soreness, and swelling (inflammation) of the skin. Contact dermatitis is a reaction to certain substances that touch the skin. You either touched something that irritated your skin, or you have allergies to something you touched. Follow these instructions at home: Skin Care  Moisturize your skin as needed.  Apply cool compresses to the affected areas.  Try taking a bath with: ? Epsom salts. Follow the instructions on the package. You can get these at a pharmacy or grocery store. ? Baking soda. Pour a small amount into the bath as told by your doctor. ? Colloidal oatmeal. Follow the instructions on the package. You can get this at a pharmacy or grocery store.  Try applying baking soda paste to your skin. Stir water into baking soda until it looks like paste.  Do not scratch your skin.  Bathe less often.  Bathe in lukewarm water. Avoid using hot water. Medicines  Take or apply over-the-counter and prescription medicines only as told by your doctor.  If you were prescribed an antibiotic medicine, take or apply your antibiotic as told by your doctor. Do not stop taking the antibiotic even if your condition starts to get better. General instructions  Keep all follow-up visits as told by your doctor. This is important.  Avoid the substance that caused your reaction. If you do not know what caused it, keep a journal to try to track what caused it. Write down: ? What you eat. ? What cosmetic products you use. ? What you drink. ? What you wear in the affected area. This includes jewelry.  If you were given a bandage (dressing), take care of it as told by your doctor. This includes when to change and remove it. Contact a doctor if:  You do not get better with treatment.  Your condition gets worse.  You have signs of infection such as: ? Swelling. ? Tenderness. ? Redness. ? Soreness. ? Warmth.  You have a fever.  You have new  symptoms. Get help right away if:  You have a very bad headache.  You have neck pain.  Your neck is stiff.  You throw up (vomit).  You feel very sleepy.  You see red streaks coming from the affected area.  Your bone or joint underneath the affected area becomes painful after the skin has healed.  The affected area turns darker.  You have trouble breathing. This information is not intended to replace advice given to you by your health care provider. Make sure you discuss any questions you have with your health care provider. Document Released: 10/03/2009 Document Revised: 05/13/2016 Document Reviewed: 04/23/2015 Elsevier Interactive Patient Education  2018 [employer]  

## 2018-11-19 NOTE — MAU Note (Signed)
Started itching yesterday on abdomen. Put some coconut oil on it and that helps the itching a little bit. The itching came back this AM  States she has not changed soaps or detergents recently. She has started taking 2 new allergy medicines recently  Having some pain from where she scratched her skin  No bleeding

## 2018-11-19 NOTE — MAU Provider Note (Signed)
Chief Complaint: Rash   First Provider Initiated Contact with Patient 11/19/18 1719     SUBJECTIVE HPI: Teresa [name] is a 37 y.o. G4P3003 at [redacted]w[redacted]d who presents to Maternity Admissions reporting rash. Symptoms began 3 days ago. Reports rash on her abdomen with itching. Denies changes in soap, lotions, or detergent. Has not noticed rash anywhere else on her body. Started new allergy meds a few weeks ago (citirizine & montelukast). Used coconut oil on abdomen yesterday with moderate relief. Used it this morning with less relief of symptoms.    Past Medical History:  Diagnosis Date  . Anemia   . Arthritis   . Asthma    Ventolin  . GERD (gastroesophageal reflux disease)   . Ulcer    OB History  Gravida Para Term Preterm AB Living  4 3 3     3   SAB TAB Ectopic Multiple Live Births        0 3    # Outcome Date GA Lbr Len/2nd Weight Sex Delivery Anes PTL Lv  4 Current           3 Term 06/28/16 [redacted]w[redacted]d  3355 g M CS-LTranv Spinal  LIV  2 Term 04/06/12 [redacted]w[redacted]d  3330 g M CS-LTranv Spinal  LIV  1 Term 02/06/10 [redacted]w[redacted]d   M CS-Unspec   LIV     Birth Comments: Fetal Distress   Past Surgical History:  Procedure Laterality Date  . CESAREAN SECTION    . CESAREAN SECTION  04/06/2012   Procedure: CESAREAN SECTION;  Surgeon: Lahoma Crocker, MD;  Location: [facility];  Service: Gynecology;  Laterality: N/A;  . CESAREAN SECTION N/A 06/28/2016   Procedure: CESAREAN SECTION;  Surgeon: Lavonia Drafts, MD;  Location: [place];  Service: Obstetrics;  Laterality: N/A;   Social History   Socioeconomic History  . Marital status: Married    Spouse name: Not on file  . Number of children: 2  . Years of education: Not on file  . Highest education level: Not on file  Occupational History  . Occupation: housewife  Social Needs  . Financial resource strain: Not on file  . Food insecurity:    Worry: Not on file    Inability: Not on file  . Transportation needs:    Medical: Not on file     Non-medical: Not on file  Tobacco Use  . Smoking status: Never Smoker  . Smokeless tobacco: Never Used  Substance and Sexual Activity  . Alcohol use: No  . Drug use: No  . Sexual activity: Yes    Partners: Male    Birth control/protection: None  Lifestyle  . Physical activity:    Days per week: Not on file    Minutes per session: Not on file  . Stress: Not on file  Relationships  . Social connections:    Talks on phone: Not on file    Gets together: Not on file    Attends religious service: Not on file    Active member of club or organization: Not on file    Attends meetings of clubs or organizations: Not on file    Relationship status: Not on file  . Intimate partner violence:    Fear of current or ex partner: Not on file    Emotionally abused: Not on file    Physically abused: Not on file    Forced sexual activity: Not on file  Other Topics Concern  . Not on file  Social History Narrative  .  Not on file   Family History  Problem Relation Age of Onset  . Diabetes Mother   . Hypertension Mother   . Other Son        Beta Thalassemia trait (minor)  . Colon cancer Neg Hx   . Esophageal cancer Neg Hx   . Rectal cancer Neg Hx   . Stomach cancer Neg Hx    No current facility-administered medications on file prior to encounter.    Current Outpatient Medications on File Prior to Encounter  Medication Sig Dispense Refill  . albuterol (PROVENTIL HFA;VENTOLIN HFA) 108 (90 Base) MCG/ACT inhaler Inhale 1-2 puffs into the lungs every 6 (six) hours as needed for wheezing or shortness of breath.    . cetirizine (ZYRTEC) 10 MG tablet Take 1 tablet (10 mg total) by mouth daily. (Patient not taking: Reported on 06/29/2018) 30 tablet 5  . fluticasone (FLONASE) 50 MCG/ACT nasal spray Place 2 sprays into both nostrils daily. (Patient not taking: Reported on 06/29/2018) 16 g 6  . Prenatal Vit-Fe Fumarate-FA (PRENATAL VITAMIN PO) Take by mouth.     No Known Allergies  I have reviewed  patient's Past Medical Hx, Surgical Hx, Family Hx, Social Hx, medications and allergies.   Review of Systems  Constitutional: Negative.   Gastrointestinal: Negative.   Genitourinary: Negative.   Musculoskeletal: Negative for myalgias.  Skin: Positive for rash.    OBJECTIVE Patient Vitals for the past 24 hrs:  BP Temp Temp src Pulse Resp Weight  11/19/18 1705 (!) 111/52 98.8 F (37.1 C) Oral 89 18 68.5 kg   Constitutional: Well-developed, well-nourished female in no acute distress.  Cardiovascular: normal rate & rhythm, no murmur Respiratory: normal rate and effort. Lung sounds clear throughout GI: Abd soft, non-tender, Pos BS x 4. No guarding or rebound tenderness MS: Extremities nontender, no edema, normal ROM Neurologic: Alert and oriented x 4.  Skin: diffuse maculopapular rash on abdomen with some excoriation. No rash noted on legs, hands, back ,or arms.   LAB RESULTS No results found for this or any previous visit (from the past 24 hour(s)).  IMAGING No results found.  MAU COURSE Orders Placed This Encounter  Procedures  . Discharge patient   Meds ordered this encounter  Medications  . triamcinolone cream (KENALOG) 0.1 %    Sig: Apply 1 application topically 2 (two) times daily for 14 days.    Dispense:  30 g    Refill:  0    Order Specific Question:   Supervising Provider    Answer:   Verita Schneiders A [[id number]]    MDM FHT present via doppler   ASSESSMENT 1. Contact dermatitis, unspecified contact dermatitis type, unspecified trigger   2. [redacted] weeks gestation of pregnancy     PLAN Discharge home in stable condition. Continue at home meds. Can take benadryl at night for symptoms. Rx triamcinolone Discussed tx of symptoms at home F/u with PCP if symptoms worsen or don't improve  Follow-up Information    Everrett Coombe, MD Follow up.   Specialty:  Family Medicine Why:  If symptoms don't improve Contact information: [address] [place]  [address] [phone]          Allergies as of 11/19/2018   No Known Allergies     Medication List    STOP taking these medications   azithromycin 250 MG tablet Commonly known as:  ZITHROMAX   cyclobenzaprine 5 MG tablet Commonly known as:  FLEXERIL   ibuprofen 600 MG tablet Commonly  known as:  ADVIL,MOTRIN   predniSONE 20 MG tablet Commonly known as:  DELTASONE     TAKE these medications   albuterol 108 (90 Base) MCG/ACT inhaler Commonly known as:  PROVENTIL HFA;VENTOLIN HFA Inhale 1-2 puffs into the lungs every 6 (six) hours as needed for wheezing or shortness of breath.   cetirizine 10 MG tablet Commonly known as:  ZYRTEC Take 1 tablet (10 mg total) by mouth daily.   fluticasone 50 MCG/ACT nasal spray Commonly known as:  FLONASE Place 2 sprays into both nostrils daily.   PRENATAL VITAMIN PO Take by mouth.   triamcinolone cream 0.1 % Commonly known as:  KENALOG Apply 1 application topically 2 (two) times daily for 14 days.        Jorje Guild, NP 11/19/2018  7:09 PM

## 2018-12-01 ENCOUNTER — Other Ambulatory Visit (HOSPITAL_COMMUNITY): Payer: Self-pay | Admitting: *Deleted

## 2018-12-01 ENCOUNTER — Ambulatory Visit (HOSPITAL_COMMUNITY)
Admission: RE | Admit: 2018-12-01 | Discharge: 2018-12-01 | Disposition: A | Discharge status: Home / Self Care | Payer: 59 | Source: Ambulatory Visit | Attending: Nurse Practitioner | Admitting: Nurse Practitioner

## 2018-12-01 ENCOUNTER — Encounter (HOSPITAL_COMMUNITY): Payer: Self-pay

## 2018-12-01 DIAGNOSIS — Z3A18 18 weeks gestation of pregnancy: Secondary | ICD-10-CM

## 2018-12-01 DIAGNOSIS — O34219 Maternal care for unspecified type scar from previous cesarean delivery: Secondary | ICD-10-CM | POA: Diagnosis not present

## 2018-12-01 DIAGNOSIS — O09522 Supervision of elderly multigravida, second trimester: Secondary | ICD-10-CM | POA: Diagnosis not present

## 2018-12-01 DIAGNOSIS — O09292 Supervision of pregnancy with other poor reproductive or obstetric history, second trimester: Secondary | ICD-10-CM

## 2018-12-01 DIAGNOSIS — O09529 Supervision of elderly multigravida, unspecified trimester: Secondary | ICD-10-CM | POA: Diagnosis present

## 2018-12-01 DIAGNOSIS — Z363 Encounter for antenatal screening for malformations: Secondary | ICD-10-CM | POA: Diagnosis not present

## 2018-12-29 ENCOUNTER — Other Ambulatory Visit (HOSPITAL_COMMUNITY): Payer: Self-pay | Admitting: *Deleted

## 2018-12-29 ENCOUNTER — Ambulatory Visit (HOSPITAL_COMMUNITY)
Admission: RE | Admit: 2018-12-29 | Discharge: 2018-12-29 | Disposition: A | Discharge status: Home / Self Care | Payer: 59 | Source: Ambulatory Visit | Attending: Nurse Practitioner | Admitting: Nurse Practitioner

## 2018-12-29 ENCOUNTER — Encounter (HOSPITAL_COMMUNITY): Payer: Self-pay

## 2018-12-29 DIAGNOSIS — Z3A22 22 weeks gestation of pregnancy: Secondary | ICD-10-CM

## 2018-12-29 DIAGNOSIS — O09293 Supervision of pregnancy with other poor reproductive or obstetric history, third trimester: Secondary | ICD-10-CM | POA: Diagnosis not present

## 2018-12-29 DIAGNOSIS — O09523 Supervision of elderly multigravida, third trimester: Secondary | ICD-10-CM | POA: Diagnosis not present

## 2018-12-29 DIAGNOSIS — O09529 Supervision of elderly multigravida, unspecified trimester: Secondary | ICD-10-CM | POA: Insufficient documentation

## 2018-12-29 DIAGNOSIS — O09299 Supervision of pregnancy with other poor reproductive or obstetric history, unspecified trimester: Secondary | ICD-10-CM

## 2018-12-29 DIAGNOSIS — Z362 Encounter for other antenatal screening follow-up: Secondary | ICD-10-CM | POA: Diagnosis not present

## 2018-12-29 DIAGNOSIS — O34219 Maternal care for unspecified type scar from previous cesarean delivery: Secondary | ICD-10-CM | POA: Diagnosis not present

## 2019-02-09 ENCOUNTER — Ambulatory Visit (HOSPITAL_COMMUNITY): Admission: RE | Admit: 2019-02-09 | Payer: Medicaid Other | Source: Ambulatory Visit

## 2019-02-15 LAB — GLUCOSE, 3 HOUR
Glucose, GTT - 1 Hour: 167 (ref ?–200)
Glucose, GTT - 2 Hour: 140 (ref ?–140)
Glucose, GTT - 3 Hour: 128 mg/dL (ref ?–140)
Glucose, GTT - Fasting: 106 mg/dL (ref 80–110)

## 2019-02-15 LAB — OB RESULTS CONSOLE HGB/HCT, BLOOD
HCT: 29 (ref 29–41)
Hemoglobin: 9

## 2019-02-20 ENCOUNTER — Ambulatory Visit: Payer: 59 | Admitting: *Deleted

## 2019-02-20 ENCOUNTER — Encounter: Payer: Medicaid Other | Attending: Family Medicine | Admitting: *Deleted

## 2019-02-20 ENCOUNTER — Encounter: Payer: Self-pay | Admitting: Family Medicine

## 2019-02-20 DIAGNOSIS — O24919 Unspecified diabetes mellitus in pregnancy, unspecified trimester: Secondary | ICD-10-CM | POA: Diagnosis not present

## 2019-02-20 DIAGNOSIS — Z3A00 Weeks of gestation of pregnancy not specified: Secondary | ICD-10-CM | POA: Diagnosis not present

## 2019-02-20 MED ORDER — ACCU-CHEK GUIDE W/DEVICE KIT: 1.0000 | PACK | Freq: Four times a day (QID) | 0 refills | Status: DC

## 2019-02-20 MED ORDER — ACCU-CHEK FASTCLIX LANCETS MISC: 1.0000 | Freq: Four times a day (QID) | 12 refills | Status: DC

## 2019-02-20 MED ORDER — GLUCOSE BLOOD VI STRP: ORAL_STRIP | 12 refills | Status: DC

## 2019-02-20 NOTE — Addendum Note (Signed)
Addended by: Michel Harrow on: 02/20/2019 12:20 PM   Modules accepted: Orders

## 2019-02-20 NOTE — Progress Notes (Signed)
  Patient was seen on 02/20/2019 for Gestational Diabetes self-management. EDD 05/02/2019. Patient states no history of GDM but she does have history of pre-diabetes for past several years. Diet history obtained. Patient eats good variety of all food groups. Beverages include water, and milk with occasional juice.  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 She states she is knowledgeable of the physiology of GDM. The following learning objectives were met by the patient :   States the definition of Gestational Diabetes  States why dietary management is important in controlling blood glucose  Describes the effects of carbohydrates on blood glucose levels  Demonstrates ability to create a balanced meal plan  Demonstrates carbohydrate counting   States when to check blood glucose levels  Demonstrates proper blood glucose monitoring techniques  States the effect of stress and exercise on blood glucose levels  States the importance of limiting caffeine and abstaining from alcohol and smoking  Plan:  Aim for 3 Carb Choices per meal (45 grams) +/- 1 either way  Aim for 1-2 Carbs per snack Begin reading food labels for Total Carbohydrate of foods If OK with your MD, consider  increasing your activity level by walking, Arm Chair Exercises or other activity daily as tolerated Begin checking BG before breakfast and 2 hours after first bite of breakfast, lunch and dinner as directed by MD  Bring Log Book/Sheet and meter to every medical appointment OR use Baby Scripts (see below) Baby Scripts:  Patient was introduced to [employer] but states she prefers to  record BG on Log Sheet Take medication if directed by MD  Blood glucose monitor Rx called into pharmacy: Accu Check Guide with Fast Clix drums Patient instructed to test pre breakfast and 2 hours each meal as directed by MD  Patient instructed to monitor glucose levels: FBS: 60 - 95  mg/dl 2 hour: <120 mg/dl  Patient received the following handouts:  Nutrition Diabetes and Pregnancy  Carbohydrate Counting List  BG Log Sheet  Patient will be seen for follow-up as needed.

## 2019-02-22 ENCOUNTER — Encounter (HOSPITAL_COMMUNITY): Payer: Self-pay | Admitting: *Deleted

## 2019-02-22 ENCOUNTER — Other Ambulatory Visit: Payer: Self-pay

## 2019-02-22 ENCOUNTER — Encounter (HOSPITAL_COMMUNITY): Payer: Self-pay

## 2019-02-22 ENCOUNTER — Ambulatory Visit (HOSPITAL_COMMUNITY)
Admission: RE | Admit: 2019-02-22 | Discharge: 2019-02-22 | Disposition: A | Discharge status: Home / Self Care | Payer: Medicaid Other | Source: Ambulatory Visit | Attending: Maternal & Fetal Medicine | Admitting: Maternal & Fetal Medicine

## 2019-02-22 ENCOUNTER — Ambulatory Visit (HOSPITAL_COMMUNITY): Payer: Medicaid Other | Admitting: *Deleted

## 2019-02-22 ENCOUNTER — Inpatient Hospital Stay (HOSPITAL_COMMUNITY)
Admission: AD | Admit: 2019-02-22 | Discharge: 2019-02-22 | Disposition: A | Discharge status: Home / Self Care | Payer: Medicaid Other | Attending: Obstetrics and Gynecology | Admitting: Obstetrics and Gynecology

## 2019-02-22 VITALS — BP 104/56 | HR 95 | Wt 156.4 lb

## 2019-02-22 DIAGNOSIS — O09529 Supervision of elderly multigravida, unspecified trimester: Secondary | ICD-10-CM | POA: Insufficient documentation

## 2019-02-22 DIAGNOSIS — O09523 Supervision of elderly multigravida, third trimester: Secondary | ICD-10-CM

## 2019-02-22 DIAGNOSIS — Z3A30 30 weeks gestation of pregnancy: Secondary | ICD-10-CM | POA: Diagnosis not present

## 2019-02-22 DIAGNOSIS — O34219 Maternal care for unspecified type scar from previous cesarean delivery: Secondary | ICD-10-CM | POA: Diagnosis not present

## 2019-02-22 DIAGNOSIS — O26893 Other specified pregnancy related conditions, third trimester: Secondary | ICD-10-CM | POA: Diagnosis present

## 2019-02-22 DIAGNOSIS — O09299 Supervision of pregnancy with other poor reproductive or obstetric history, unspecified trimester: Secondary | ICD-10-CM | POA: Diagnosis not present

## 2019-02-22 DIAGNOSIS — Z0371 Encounter for suspected problem with amniotic cavity and membrane ruled out: Secondary | ICD-10-CM | POA: Diagnosis not present

## 2019-02-22 DIAGNOSIS — O09293 Supervision of pregnancy with other poor reproductive or obstetric history, third trimester: Secondary | ICD-10-CM | POA: Diagnosis not present

## 2019-02-22 LAB — AMNISURE RUPTURE OF MEMBRANE (ROM) NOT AT ARMC: Amnisure ROM: NEGATIVE

## 2019-02-22 LAB — POCT FERN TEST

## 2019-02-22 NOTE — MAU Provider Note (Addendum)
Chief Complaint:  Rupture of Membranes   First Provider Initiated Contact with Patient 02/22/19 1821      HPI: Teresa [name] is a 38 y.o. G4P3003 at 33w1dpt of [facility] with hx C/S at term x 3 who presents to maternity admissions reporting large gush of fluid soaking her clothes x 1 episode while shopping today.  She reports no leaking since this incident.  She is having some mild to moderate intermittent cramping in her low abdomen that radiates to her back, starting before the fluid leakage. There are no other symptoms. She has not tried any treatments. She reports good fetal movement, denies LOF, vaginal bleeding, vaginal itching/burning, urinary symptoms, h/a, dizziness, n/v, or fever/chills.    HPI  Past Medical History: Past Medical History:  Diagnosis Date  . Anemia   . Arthritis   . Asthma    Ventolin  . GERD (gastroesophageal reflux disease)   . Ulcer     Past obstetric history: OB History  Gravida Para Term Preterm AB Living  '4 3 3     3  ' SAB TAB Ectopic Multiple Live Births        0 3    # Outcome Date GA Lbr Len/2nd Weight Sex Delivery Anes PTL Lv  4 Current           3 Term 06/28/16 [id number] g M CS-LTranv Spinal  LIV  2 Term 04/06/12 3970w1d330 g M CS-LTranv Spinal  LIV  1 Term 02/06/10 [id number] CS-Unspec   LIV     Birth Comments: Fetal Distress    Past Surgical History: Past Surgical History:  Procedure Laterality Date  . CESAREAN SECTION    . CESAREAN SECTION  04/06/2012   Procedure: CESAREAN SECTION;  Surgeon: LisaLahoma Crocker;  [facility];  Service: Gynecology;  Laterality: N/A;  . CESAREAN SECTION N/A 06/28/2016   Procedure: CESAREAN SECTION;  Surgeon: CaroLavonia Drafts;  [facility];  Laterality: N/A;    Family History: Family History  Problem Relation Age of Onset  . Diabetes Mother   . Hypertension Mother   . Other Son        Beta Thalassemia trait (minor)  . Colon cancer Neg Hx    . Esophageal cancer Neg Hx   . Rectal cancer Neg Hx   . Stomach cancer Neg Hx     Social History: Social History   Tobacco Use  . Smoking status: Never Smoker  . Smokeless tobacco: Never Used  Substance Use Topics  . Alcohol use: No  . Drug use: No    Allergies: No Known Allergies  Meds:  Medications Prior to Admission  Medication Sig Dispense Refill Last Dose  . cetirizine (ZYRTEC) 10 MG tablet Take 1 tablet (10 mg total) by mouth daily. 30 tablet 5 02/21/2019 at Unknown time  . Ferrous Sulfate (IRON PO) Take by mouth.   02/21/2019 at Unknown time  . MONTELUKAST SODIUM PO Take by mouth.   02/21/2019 at Unknown time  . Prenatal Vit-Fe Fumarate-FA (PRENATAL VITAMIN PO) Take by mouth.   02/21/2019 at Unknown time  . ACCU-CHEK FASTCLIX LANCETS MISC 1 each by Percutaneous route 4 (four) times daily. 100 each 12   . albuterol (PROVENTIL HFA;VENTOLIN HFA) 108 (90 Base) MCG/ACT inhaler Inhale 1-2 puffs into the lungs every 6 (six) hours as needed for wheezing or shortness of breath.   Taking  . Blood Glucose Monitoring Suppl (ACCU-CHEK GUIDE)  w/Device KIT 1 Device by Does not apply route 4 (four) times daily. 1 kit 0   . fluticasone (FLONASE) 50 MCG/ACT nasal spray Place 2 sprays into both nostrils daily. 16 g 6 Taking  . glucose blood (ACCU-CHEK GUIDE) test strip Use as instructed 100 each 12     ROS:  Review of Systems  Constitutional: Negative for chills, fatigue and fever.  Eyes: Negative for visual disturbance.  Respiratory: Negative for shortness of breath.   Cardiovascular: Negative for chest pain.  Gastrointestinal: Positive for abdominal pain. Negative for nausea and vomiting.  Genitourinary: Positive for vaginal discharge. Negative for difficulty urinating, dysuria, flank pain, pelvic pain, vaginal bleeding and vaginal pain.  Musculoskeletal: Positive for back pain.  Neurological: Negative for dizziness and headaches.  Psychiatric/Behavioral: Negative.      I have reviewed  patient's Past Medical Hx, Surgical Hx, Family Hx, Social Hx, medications and allergies.   Physical Exam   Patient Vitals for the past 24 hrs:  BP Temp Temp src Pulse Resp SpO2 Weight  02/22/19 1707 (!) 127/56 98.6 F (37 C) Oral 100 16 100 % 71.4 kg   Constitutional: Well-developed, well-nourished female in no acute distress.  Cardiovascular: normal rate Respiratory: normal effort GI: Abd soft, non-tender, gravid appropriate for gestational age.  MS: Extremities nontender, no edema, normal ROM Neurologic: Alert and oriented x 4.  GU: Neg CVAT.  PELVIC EXAM: Cervix pink, visually closed, without lesion, scant white creamy discharge, vaginal walls and external genitalia normal Bimanual exam: Cervix 0/long/high, firm, anterior, neg CMT, uterus nontender, nonenlarged, adnexa without tenderness, enlargement, or mass  Dilation: Closed Effacement (%): Thick Cervical Position: Posterior Exam by:: Leftwich-Kirby, CNM  FHT:  Baseline 135, moderate variability, accelerations present, no decelerations Contractions: q 15-20 mins   Labs: Results for orders placed or performed during the hospital encounter of 02/22/19 (from the past 24 hour(s))  Fern Test     Status: Normal   Collection Time: 02/22/19  5:38 PM  Result Value Ref Range   POCT Fern Test        Imaging:    MAU Course/MDM: Orders Placed This Encounter  Procedures  . Amnisure rupture of membrane (rom)not at [facility]  . Fern Test    No orders of the defined types were placed in this encounter.    NST reviewed and reactive Fern slide by RN is negative Irregular contractions on Toco, cervix closed/thick/high, so no signs of preterm labor SSE: No pooling of fluid with valsalva Cervix not well visualized on SSE, and pt with cramping/contractions so SVE performed Amnisure negative so no evidence of PPROM today Likely fluid was urine today, pt reassured Keep scheduled appt Return to MAU as needed  A: 1. Encounter for  suspected premature rupture of amniotic membranes, with rupture of membranes not found     P: D/C home  Fatima Blank Certified Nurse-Midwife 02/22/2019 7:06 PM

## 2019-02-22 NOTE — MAU Note (Signed)
Was running an errand, and felt sudden wetness, pants were soaked.  No pain right now.  No bleeding.

## 2019-02-22 NOTE — MAU Note (Signed)
Pt takes CBG AC and 2PP

## 2019-02-23 ENCOUNTER — Other Ambulatory Visit (HOSPITAL_COMMUNITY): Payer: Self-pay | Admitting: *Deleted

## 2019-02-23 DIAGNOSIS — O09523 Supervision of elderly multigravida, third trimester: Secondary | ICD-10-CM

## 2019-02-26 ENCOUNTER — Encounter: Payer: 59 | Admitting: Obstetrics & Gynecology

## 2019-03-02 ENCOUNTER — Other Ambulatory Visit: Payer: Self-pay | Admitting: Obstetrics & Gynecology

## 2019-03-02 ENCOUNTER — Other Ambulatory Visit: Payer: Self-pay

## 2019-03-02 ENCOUNTER — Encounter: Payer: Self-pay | Admitting: Emergency Medicine

## 2019-03-02 ENCOUNTER — Ambulatory Visit (INDEPENDENT_AMBULATORY_CARE_PROVIDER_SITE_OTHER): Payer: Medicaid Other | Admitting: Family Medicine

## 2019-03-02 VITALS — BP 107/56 | HR 93 | Wt 156.7 lb

## 2019-03-02 DIAGNOSIS — O0990 Supervision of high risk pregnancy, unspecified, unspecified trimester: Secondary | ICD-10-CM | POA: Insufficient documentation

## 2019-03-02 DIAGNOSIS — Z3A31 31 weeks gestation of pregnancy: Secondary | ICD-10-CM

## 2019-03-02 DIAGNOSIS — O34219 Maternal care for unspecified type scar from previous cesarean delivery: Secondary | ICD-10-CM

## 2019-03-02 DIAGNOSIS — O24419 Gestational diabetes mellitus in pregnancy, unspecified control: Secondary | ICD-10-CM | POA: Insufficient documentation

## 2019-03-02 DIAGNOSIS — O24414 Gestational diabetes mellitus in pregnancy, insulin controlled: Secondary | ICD-10-CM

## 2019-03-02 NOTE — Patient Instructions (Signed)

## 2019-03-02 NOTE — Progress Notes (Signed)
   PRENATAL VISIT NOTE  Subjective:  Teresa [name] is a 38 y.o. [phone] at [redacted]w[redacted]d being seen today for transferring prenatal care from [facility] due to new diagnosis of GDM.  She is currently monitored for the following issues for this high-risk pregnancy and has Previous cesarean delivery affecting pregnancy, antepartum; Irregular menstrual cycle; Family history of beta thalassemia; Palpitations; History of prediabetes; Muscle spasm; Chronic tension headache; Supervision of high risk pregnancy, antepartum; and Gestational diabetes on their problem list.  Patient reports no complaints.  Contractions: Not present. Vag. Bleeding: None.  Movement: Present. Denies leaking of fluid.   The following portions of the patient's history were reviewed and updated as appropriate: allergies, current medications, past family history, past medical history, past social history, past surgical history and problem list.   Objective:   Vitals:   03/02/19 1004  BP: (!) 107/56  Pulse: 93  Weight: 156 lb 11.2 oz (71.1 kg)    Fetal Status: Fetal Heart Rate (bpm): 144   Movement: Present     General:  Alert, oriented and cooperative. Patient is in no acute distress.  Skin: Skin is warm and dry. No rash noted.   Cardiovascular: Normal heart rate noted  Respiratory: Normal respiratory effort, no problems with respiration noted  Abdomen: Soft, gravid, appropriate for gestational age.  Pain/Pressure: Present     Pelvic: Cervical exam deferred        Extremities: Normal range of motion.     Mental Status: Normal mood and affect. Normal behavior. Normal judgment and thought content.   Assessment and Plan:  Pregnancy: G4P3003 at [redacted]w[redacted]d 1. Supervision of high risk pregnancy, antepartum Box updated  2. Diet controlled gestational diabetes mellitus (GDM) in third trimester FBS 82-263 (3 in range) 2 hour pp 95-188 (8 of 24 out of range) She was having pancakes with syrup and birthday cake. Will take one more week to  follow diet and see if CBGs are better controlled.  3. Previous cesarean delivery affecting pregnancy, antepartum X3, For RCS--scheduled Declines BTL  Preterm labor symptoms and general obstetric precautions including but not limited to vaginal bleeding, contractions, leaking of fluid and fetal movement were reviewed in detail with the patient. Please refer to After Visit Summary for other counseling recommendations.   Return in 1 week (on 03/09/2019).  Future Appointments  Date Time Provider Department Center  03/08/2019  1:35 PM Donnamae Jude, MD [place]  03/15/2019  2:35 PM Sloan Leiter, MD [facility]  03/22/2019 11:00 AM [place] [facility]  03/22/2019 11:15 AM [facility] [place] 4 [facility]    Donnamae Jude, MD

## 2019-03-02 NOTE — Progress Notes (Signed)
Orders for cesarean section 

## 2019-03-02 NOTE — Progress Notes (Signed)
ini

## 2019-03-08 ENCOUNTER — Ambulatory Visit (INDEPENDENT_AMBULATORY_CARE_PROVIDER_SITE_OTHER): Payer: Medicaid Other | Admitting: Family Medicine

## 2019-03-08 ENCOUNTER — Other Ambulatory Visit: Payer: Self-pay

## 2019-03-08 VITALS — BP 99/50 | HR 98 | Wt 156.9 lb

## 2019-03-08 DIAGNOSIS — O0993 Supervision of high risk pregnancy, unspecified, third trimester: Secondary | ICD-10-CM

## 2019-03-08 DIAGNOSIS — O34219 Maternal care for unspecified type scar from previous cesarean delivery: Secondary | ICD-10-CM

## 2019-03-08 DIAGNOSIS — O24414 Gestational diabetes mellitus in pregnancy, insulin controlled: Secondary | ICD-10-CM

## 2019-03-08 DIAGNOSIS — Z3A32 32 weeks gestation of pregnancy: Secondary | ICD-10-CM

## 2019-03-08 DIAGNOSIS — O0990 Supervision of high risk pregnancy, unspecified, unspecified trimester: Secondary | ICD-10-CM

## 2019-03-08 NOTE — Progress Notes (Signed)
   PRENATAL VISIT NOTE  Subjective:  Teresa [name] is a 38 y.o. [phone] at [redacted]w[redacted]d being seen today for ongoing prenatal care.  She is currently monitored for the following issues for this high-risk pregnancy and has Previous cesarean delivery affecting pregnancy, antepartum; Irregular menstrual cycle; Family history of beta thalassemia; Palpitations; History of prediabetes; Muscle spasm; Chronic tension headache; Supervision of high risk pregnancy, antepartum; and Gestational diabetes on their problem list.  Patient reports no complaints.  Contractions: Irritability. Vag. Bleeding: None.  Movement: Present. Denies leaking of fluid.   The following portions of the patient's history were reviewed and updated as appropriate: allergies, current medications, past family history, past medical history, past social history, past surgical history and problem list.   Objective:   Vitals:   03/08/19 1341  BP: (!) 99/50  Pulse: 98  Weight: 156 lb 14.4 oz (71.2 kg)    Fetal Status: Fetal Heart Rate (bpm): 138   Movement: Present     General:  Alert, oriented and cooperative. Patient is in no acute distress.  Skin: Skin is warm and dry. No rash noted.   Cardiovascular: Normal heart rate noted  Respiratory: Normal respiratory effort, no problems with respiration noted  Abdomen: Soft, gravid, appropriate for gestational age.  Pain/Pressure: Present     Pelvic: Cervical exam deferred        Extremities: Normal range of motion.  Edema: None  Mental Status: Normal mood and affect. Normal behavior. Normal judgment and thought content.   Assessment and Plan:  Pregnancy: G4P3003 at [redacted]w[redacted]d 1.Diet controlled gestational diabetes mellitus (GDM) in third trimester Has worked on diet FBS 75-109 (3 of 7 out of range)--not true fastings 2 hour pp 90-175 (3 of 18 out of range)   2. Supervision of high risk pregnancy, antepartum   3. Previous cesarean delivery affecting pregnancy, antepartum X 3,  scheduled for RCS  Preterm labor symptoms and general obstetric precautions including but not limited to vaginal bleeding, contractions, leaking of fluid and fetal movement were reviewed in detail with the patient. Please refer to After Visit Summary for other counseling recommendations.   Return in 2 weeks (on 03/22/2019).  Future Appointments  Date Time Provider [place]  03/15/2019  2:35 PM Sloan Leiter, MD [facility] [place]  03/22/2019  9:15 AM Truett Mainland, DO [facility] [place]  03/22/2019 11:00 AM [place] [facility]  03/22/2019 11:15 AM [facility] [place] 4 [facility]    Donnamae Jude, MD

## 2019-03-08 NOTE — Patient Instructions (Signed)

## 2019-03-12 ENCOUNTER — Telehealth: Payer: Self-pay | Admitting: Family Medicine

## 2019-03-12 ENCOUNTER — Encounter: Payer: Self-pay | Admitting: *Deleted

## 2019-03-12 NOTE — Telephone Encounter (Signed)
Patient requesting a call back from the nurse.

## 2019-03-15 ENCOUNTER — Other Ambulatory Visit: Payer: Self-pay

## 2019-03-15 ENCOUNTER — Encounter: Payer: Self-pay | Admitting: Obstetrics and Gynecology

## 2019-03-15 ENCOUNTER — Ambulatory Visit (INDEPENDENT_AMBULATORY_CARE_PROVIDER_SITE_OTHER): Payer: Medicaid Other | Admitting: Obstetrics and Gynecology

## 2019-03-15 ENCOUNTER — Telehealth: Payer: Self-pay | Admitting: Obstetrics and Gynecology

## 2019-03-15 DIAGNOSIS — O0990 Supervision of high risk pregnancy, unspecified, unspecified trimester: Secondary | ICD-10-CM

## 2019-03-15 DIAGNOSIS — Z3A33 33 weeks gestation of pregnancy: Secondary | ICD-10-CM | POA: Diagnosis not present

## 2019-03-15 DIAGNOSIS — O34219 Maternal care for unspecified type scar from previous cesarean delivery: Secondary | ICD-10-CM | POA: Diagnosis not present

## 2019-03-15 DIAGNOSIS — O24410 Gestational diabetes mellitus in pregnancy, diet controlled: Secondary | ICD-10-CM | POA: Diagnosis not present

## 2019-03-15 DIAGNOSIS — O0993 Supervision of high risk pregnancy, unspecified, third trimester: Secondary | ICD-10-CM

## 2019-03-15 NOTE — Telephone Encounter (Signed)
Called the patient, left a voicemail to call our clinic.

## 2019-03-15 NOTE — Progress Notes (Signed)
   TELEHEALTH VIRTUAL OBSTETRICS VISIT ENCOUNTER NOTE  I connected with Teresa [name] on 03/15/19 at  2:35 PM EDT by telephone at home and verified that I am speaking with the correct person using two identifiers.   I discussed the limitations, risks, security and privacy concerns of performing an evaluation and management service by telephone and the availability of in person appointments. I also discussed with the patient that there may be a patient responsible charge related to this service. The patient expressed understanding and agreed to proceed.  Subjective:  Teresa [name] is a 38 y.o. [phone] at [redacted]w[redacted]d being followed for ongoing prenatal care.  She is currently monitored for the following issues for this high-risk pregnancy and has Previous cesarean delivery affecting pregnancy, antepartum; Irregular menstrual cycle; Family history of beta thalassemia; Palpitations; History of prediabetes; Muscle spasm; Chronic tension headache; Supervision of high risk pregnancy, antepartum; and Gestational diabetes on their problem list.  Patient reports she had regular contractions for several hours on Monday, that subsided. Reports fetal movement. Denies any contractions, bleeding or leaking of fluid.   The following portions of the patient's history were reviewed and updated as appropriate: allergies, current medications, past family history, past medical history, past social history, past surgical history and problem list.   Objective:   General:  Alert, oriented and cooperative.   Mental Status: Normal mood and affect perceived. Normal judgment and thought content.  Rest of physical exam deferred due to type of encounter  Assessment and Plan:  Pregnancy: G4P3003 at [redacted]w[redacted]d  1. Supervision of high risk pregnancy, antepartum  2. Previous cesarean delivery affecting pregnancy, antepartum Has RCS scheduled for 04/27/09  3. Diet controlled gestational diabetes mellitus (GDM) in third  trimester Checking CBG regularly FG: 90-94 PP: 80s Last growth wnl, next growth scheduled for 03/22/19   Preterm labor symptoms and general obstetric precautions including but not limited to vaginal bleeding, contractions, leaking of fluid and fetal movement were reviewed in detail with the patient.  I discussed the assessment and treatment plan with the patient. The patient was provided an opportunity to ask questions and all were answered. The patient agreed with the plan and demonstrated an understanding of the instructions. The patient was advised to call back or seek an in-person office evaluation/go to [facility]U at [facility] for any urgent or concerning symptoms. Please refer to After Visit Summary for other counseling recommendations.   Return in about 1 week (around 03/22/2019) for OB visit (MD).  Future Appointments  Date Time Provider [place]  03/22/2019  9:15 AM Jacob Moores [facility] [place]  03/22/2019 11:00 AM [place] NURSE [place] [facility]  03/22/2019 11:15 AM [place], [place] for [place], [place]

## 2019-03-19 NOTE — Telephone Encounter (Signed)
Per chart had televisit with provider for ob fu 03/15/19. I called and left a message I was returning a call and see that you had a visit since you left message- if you still have a question , please call us and leave a detailed message.

## 2019-03-21 ENCOUNTER — Telehealth: Payer: Self-pay | Admitting: Obstetrics and Gynecology

## 2019-03-21 NOTE — Telephone Encounter (Signed)
Called the patient to inform of the telephone visit tomorrow. Also informed the patient of the blood cuffs. She stated she does not have access to any however she will stop by our clinic before or after her ultrasound appointment to retrieve a pair of cuffs.

## 2019-03-22 ENCOUNTER — Other Ambulatory Visit (HOSPITAL_COMMUNITY): Payer: Self-pay | Admitting: Obstetrics and Gynecology

## 2019-03-22 ENCOUNTER — Ambulatory Visit (HOSPITAL_COMMUNITY): Payer: Medicaid Other | Admitting: *Deleted

## 2019-03-22 ENCOUNTER — Other Ambulatory Visit (HOSPITAL_COMMUNITY): Payer: Self-pay | Admitting: *Deleted

## 2019-03-22 ENCOUNTER — Other Ambulatory Visit: Payer: Self-pay

## 2019-03-22 ENCOUNTER — Telehealth: Payer: Self-pay | Admitting: Family Medicine

## 2019-03-22 ENCOUNTER — Ambulatory Visit (INDEPENDENT_AMBULATORY_CARE_PROVIDER_SITE_OTHER): Payer: Medicaid Other | Admitting: Family Medicine

## 2019-03-22 ENCOUNTER — Ambulatory Visit (HOSPITAL_COMMUNITY)
Admission: RE | Admit: 2019-03-22 | Discharge: 2019-03-22 | Disposition: A | Discharge status: Home / Self Care | Payer: Medicaid Other | Source: Ambulatory Visit | Attending: Obstetrics and Gynecology | Admitting: Obstetrics and Gynecology

## 2019-03-22 ENCOUNTER — Encounter (HOSPITAL_COMMUNITY): Payer: Self-pay

## 2019-03-22 VITALS — BP 119/50 | HR 94 | Temp 98.8°F

## 2019-03-22 DIAGNOSIS — O24410 Gestational diabetes mellitus in pregnancy, diet controlled: Secondary | ICD-10-CM

## 2019-03-22 DIAGNOSIS — O403XX0 Polyhydramnios, third trimester, not applicable or unspecified: Secondary | ICD-10-CM

## 2019-03-22 DIAGNOSIS — O09293 Supervision of pregnancy with other poor reproductive or obstetric history, third trimester: Secondary | ICD-10-CM

## 2019-03-22 DIAGNOSIS — O34219 Maternal care for unspecified type scar from previous cesarean delivery: Secondary | ICD-10-CM

## 2019-03-22 DIAGNOSIS — O09523 Supervision of elderly multigravida, third trimester: Secondary | ICD-10-CM

## 2019-03-22 DIAGNOSIS — O09522 Supervision of elderly multigravida, second trimester: Secondary | ICD-10-CM

## 2019-03-22 DIAGNOSIS — Z362 Encounter for other antenatal screening follow-up: Secondary | ICD-10-CM

## 2019-03-22 DIAGNOSIS — Z3A34 34 weeks gestation of pregnancy: Secondary | ICD-10-CM

## 2019-03-22 DIAGNOSIS — O409XX0 Polyhydramnios, unspecified trimester, not applicable or unspecified: Secondary | ICD-10-CM

## 2019-03-22 DIAGNOSIS — O0990 Supervision of high risk pregnancy, unspecified, unspecified trimester: Secondary | ICD-10-CM

## 2019-03-22 MED ORDER — GLUCOSE BLOOD VI STRP: ORAL_STRIP | 12 refills | Status: DC

## 2019-03-22 NOTE — Progress Notes (Signed)
   TELEHEALTH VIRTUAL OBSTETRICS VISIT ENCOUNTER NOTE  I connected with Teresa [name] on 03/22/19 at  9:15 AM EDT by telephone at home and verified that I am speaking with the correct person using two identifiers.   I discussed the limitations, risks, security and privacy concerns of performing an evaluation and management service by telephone and the availability of in person appointments. I also discussed with the patient that there may be a patient responsible charge related to this service. The patient expressed understanding and agreed to proceed.  Subjective:  Teresa [name] is a 38 y.o. [phone] at [redacted]w[redacted]d being followed for ongoing prenatal care.  She is currently monitored for the following issues for this high-risk pregnancy and has Previous cesarean delivery affecting pregnancy, antepartum; Irregular menstrual cycle; Family history of beta thalassemia; Palpitations; History of prediabetes; Muscle spasm; Chronic tension headache; Supervision of high risk pregnancy, antepartum; and Gestational diabetes on their problem list.  Patient reports no complaints. Reports fetal movement. Denies any contractions, bleeding or leaking of fluid.   GDM:  Fasting: mostly controlled (1 elevated) 2hr PP: 2 elevated, but otherwise controlled.   The following portions of the patient's history were reviewed and updated as appropriate: allergies, current medications, past family history, past medical history, past social history, past surgical history and problem list.   Objective:   General:  Alert, oriented and cooperative.   Mental Status: Normal mood and affect perceived. Normal judgment and thought content.  Rest of physical exam deferred due to type of encounter  Assessment and Plan:  Pregnancy: G4P3003 at [redacted]w[redacted]d  1. Supervision of high risk pregnancy, antepartum Normal movement. F/u in 2 weeks in office for GBS  2. Previous cesarean delivery affecting pregnancy, antepartum Already  schedule  3. Diet controlled gestational diabetes mellitus (GDM) in third trimester Has f/u US with MFM later today. Continue diet control.   Preterm labor symptoms and general obstetric precautions including but not limited to vaginal bleeding, contractions, leaking of fluid and fetal movement were reviewed in detail with the patient.  I discussed the assessment and treatment plan with the patient. The patient was provided an opportunity to ask questions and all were answered. The patient agreed with the plan and demonstrated an understanding of the instructions. The patient was advised to call back or seek an in-person office evaluation/go to MAU at [facility] for any urgent or concerning symptoms. Please refer to After Visit Summary for other counseling recommendations.   I provided 12 minutes of non-face-to-face time during this encounter.  No follow-ups on file.  Future Appointments  Date Time Provider [place]  03/22/2019 11:00 AM [place] [facility]  03/22/2019 11:15 AM [place] for [employer], [place]

## 2019-03-22 NOTE — Telephone Encounter (Signed)
Attempted to call patient to give her the next prenatal appointment on 4/20 @ 10:15. No answer, no voicemail left. Number rang for several minutes before it rang busy.

## 2019-04-02 ENCOUNTER — Ambulatory Visit (HOSPITAL_COMMUNITY): Payer: Medicaid Other

## 2019-04-06 ENCOUNTER — Telehealth: Payer: Self-pay | Admitting: Family Medicine

## 2019-04-06 NOTE — Telephone Encounter (Signed)
Called the patient to inform of new location and face to face appointment. Verbalized understanding, no questions at this time.

## 2019-04-09 ENCOUNTER — Other Ambulatory Visit: Payer: Self-pay

## 2019-04-09 ENCOUNTER — Ambulatory Visit (INDEPENDENT_AMBULATORY_CARE_PROVIDER_SITE_OTHER): Payer: Medicaid Other | Admitting: Family Medicine

## 2019-04-09 ENCOUNTER — Other Ambulatory Visit (HOSPITAL_COMMUNITY)
Admission: RE | Admit: 2019-04-09 | Discharge: 2019-04-09 | Disposition: A | Discharge status: Home / Self Care | Payer: Medicaid Other | Source: Ambulatory Visit | Attending: Family Medicine | Admitting: Family Medicine

## 2019-04-09 VITALS — BP 106/51 | HR 88 | Temp 98.5°F | Wt 157.6 lb

## 2019-04-09 DIAGNOSIS — O0993 Supervision of high risk pregnancy, unspecified, third trimester: Secondary | ICD-10-CM

## 2019-04-09 DIAGNOSIS — O0990 Supervision of high risk pregnancy, unspecified, unspecified trimester: Secondary | ICD-10-CM

## 2019-04-09 DIAGNOSIS — Z3A36 36 weeks gestation of pregnancy: Secondary | ICD-10-CM

## 2019-04-09 DIAGNOSIS — O24410 Gestational diabetes mellitus in pregnancy, diet controlled: Secondary | ICD-10-CM

## 2019-04-09 DIAGNOSIS — O34219 Maternal care for unspecified type scar from previous cesarean delivery: Secondary | ICD-10-CM

## 2019-04-09 NOTE — Progress Notes (Signed)
Subjective:  Teresa [name] is a 38 y.o. [phone] at [redacted]w[redacted]d being seen today for ongoing prenatal care.  She is currently monitored for the following issues for this high-risk pregnancy and has Previous cesarean delivery affecting pregnancy, antepartum; Irregular menstrual cycle; Family history of beta thalassemia; Palpitations; History of prediabetes; Muscle spasm; Chronic tension headache; Supervision of high risk pregnancy, antepartum; and Gestational diabetes on their problem list.  GDM: Patient diet controlled. Fasting: mostly controlled (2-3 elevated) 2hr PP: mostly controlled (7 elevated over past 2 weeks)  Patient reports occasional contractions.  Contractions: Irritability. Vag. Bleeding: None.  Movement: Present. Denies leaking of fluid.   The following portions of the patient's history were reviewed and updated as appropriate: allergies, current medications, past family history, past medical history, past social history, past surgical history and problem list. Problem list updated.  Objective:   Vitals:   04/09/19 1022  BP: (!) 106/51  Pulse: 88  Temp: 98.5 F (36.9 C)  Weight: 157 lb 9.6 oz (71.5 kg)    Fetal Status: Fetal Heart Rate (bpm): 135 Fundal Height: 40 cm Movement: Present  Presentation: Vertex  General:  Alert, oriented and cooperative. Patient is in no acute distress.  Skin: Skin is warm and dry. No rash noted.   Cardiovascular: Normal heart rate noted  Respiratory: Normal respiratory effort, no problems with respiration noted  Abdomen: Soft, gravid, appropriate for gestational age. Pain/Pressure: Present     Pelvic: Vag. Bleeding: None     Cervical exam performed Dilation: Fingertip Effacement (%): Thick    Extremities: Normal range of motion.  Edema: None  Mental Status: Normal mood and affect. Normal behavior. Normal judgment and thought content.   Urinalysis:      Assessment and Plan:  Pregnancy: G4P3003 at [redacted]w[redacted]d  1. Supervision of high risk  pregnancy, antepartum FHT and FH normal - Culture, beta strep (group b only) - GC/Chlamydia probe amp ([place])not at [facility]  2. Previous cesarean delivery affecting pregnancy, antepartum Scheduled for repeat  3. Diet controlled gestational diabetes mellitus (GDM) in third trimester Continue diet control.  Has [place] tomorrow.  Preterm labor symptoms and general obstetric precautions including but not limited to vaginal bleeding, contractions, leaking of fluid and fetal movement were reviewed in detail with the patient. Please refer to After Visit Summary for other counseling recommendations.  Return in about 2 weeks (around 04/23/2019) for HR OB f/u, virtual.   Truett Mainland, DO

## 2019-04-10 ENCOUNTER — Ambulatory Visit (HOSPITAL_COMMUNITY)
Admission: RE | Admit: 2019-04-10 | Discharge: 2019-04-10 | Disposition: A | Discharge status: Home / Self Care | Payer: Medicaid Other | Source: Ambulatory Visit | Attending: Obstetrics and Gynecology | Admitting: Obstetrics and Gynecology

## 2019-04-10 ENCOUNTER — Ambulatory Visit (HOSPITAL_COMMUNITY): Payer: Medicaid Other | Admitting: *Deleted

## 2019-04-10 ENCOUNTER — Encounter (HOSPITAL_COMMUNITY): Payer: Self-pay | Admitting: *Deleted

## 2019-04-10 ENCOUNTER — Other Ambulatory Visit: Payer: Self-pay

## 2019-04-10 DIAGNOSIS — O0990 Supervision of high risk pregnancy, unspecified, unspecified trimester: Secondary | ICD-10-CM

## 2019-04-10 DIAGNOSIS — O09293 Supervision of pregnancy with other poor reproductive or obstetric history, third trimester: Secondary | ICD-10-CM

## 2019-04-10 DIAGNOSIS — O24410 Gestational diabetes mellitus in pregnancy, diet controlled: Secondary | ICD-10-CM

## 2019-04-10 DIAGNOSIS — O409XX0 Polyhydramnios, unspecified trimester, not applicable or unspecified: Secondary | ICD-10-CM | POA: Insufficient documentation

## 2019-04-10 DIAGNOSIS — Z3A36 36 weeks gestation of pregnancy: Secondary | ICD-10-CM

## 2019-04-10 DIAGNOSIS — O403XX0 Polyhydramnios, third trimester, not applicable or unspecified: Secondary | ICD-10-CM

## 2019-04-10 DIAGNOSIS — O09522 Supervision of elderly multigravida, second trimester: Secondary | ICD-10-CM

## 2019-04-10 DIAGNOSIS — O34219 Maternal care for unspecified type scar from previous cesarean delivery: Secondary | ICD-10-CM

## 2019-04-10 LAB — GC/CHLAMYDIA PROBE AMP (~~LOC~~) NOT AT ARMC
Chlamydia: NEGATIVE
Neisseria Gonorrhea: NEGATIVE

## 2019-04-11 ENCOUNTER — Other Ambulatory Visit (HOSPITAL_COMMUNITY): Payer: Self-pay | Admitting: *Deleted

## 2019-04-11 DIAGNOSIS — O24410 Gestational diabetes mellitus in pregnancy, diet controlled: Secondary | ICD-10-CM

## 2019-04-12 ENCOUNTER — Encounter (HOSPITAL_COMMUNITY): Payer: Self-pay

## 2019-04-12 NOTE — Patient Instructions (Signed)
Teresa [name] [facility]  04/12/2019   Your procedure is scheduled on:  04/27/2019  Arrive at 29 at Entrance C on [address] at [facility]  and [address]. You are invited to use the FREE valet parking or use the Visitor's parking deck.  Pick up the phone at the desk and dial [phone].  Call this number if you have problems the morning of surgery: [phone]  Remember:   Do not eat food:(After Midnight) Desps de medianoche.  Do not drink clear liquids: (After Midnight) Desps de medianoche.  Take these medicines the morning of surgery with A SIP OF WATER:  Bring your inhaler with you   Do not wear jewelry, make-up or nail polish.  Do not wear lotions, powders, or perfumes. Do not wear deodorant.  Do not shave 48 hours prior to surgery.  Do not bring valuables to the hospital.  [facility] is not   responsible for any belongings or valuables brought to the hospital.  Contacts, dentures or bridgework may not be worn into surgery.  Leave suitcase in the car. After surgery it may be brought to your room.  For patients admitted to the hospital, checkout time is 11:00 AM the day of              discharge.      Please read over the following fact sheets that you were given:     Preparing for Surgery

## 2019-04-13 LAB — CULTURE, BETA STREP (GROUP B ONLY): Strep Gp B Culture: NEGATIVE

## 2019-04-17 ENCOUNTER — Encounter (HOSPITAL_COMMUNITY): Payer: Self-pay

## 2019-04-17 ENCOUNTER — Ambulatory Visit (HOSPITAL_COMMUNITY): Payer: Medicaid Other | Admitting: *Deleted

## 2019-04-17 ENCOUNTER — Other Ambulatory Visit: Payer: Self-pay

## 2019-04-17 ENCOUNTER — Other Ambulatory Visit (HOSPITAL_COMMUNITY): Payer: Self-pay | Admitting: *Deleted

## 2019-04-17 ENCOUNTER — Ambulatory Visit (HOSPITAL_COMMUNITY)
Admission: RE | Admit: 2019-04-17 | Discharge: 2019-04-17 | Disposition: A | Discharge status: Home / Self Care | Payer: Medicaid Other | Source: Ambulatory Visit | Attending: Maternal & Fetal Medicine | Admitting: Maternal & Fetal Medicine

## 2019-04-17 VITALS — BP 107/54 | HR 96 | Temp 98.3°F

## 2019-04-17 DIAGNOSIS — O34219 Maternal care for unspecified type scar from previous cesarean delivery: Secondary | ICD-10-CM | POA: Diagnosis not present

## 2019-04-17 DIAGNOSIS — O0990 Supervision of high risk pregnancy, unspecified, unspecified trimester: Secondary | ICD-10-CM

## 2019-04-17 DIAGNOSIS — Z3A37 37 weeks gestation of pregnancy: Secondary | ICD-10-CM

## 2019-04-17 DIAGNOSIS — O24419 Gestational diabetes mellitus in pregnancy, unspecified control: Secondary | ICD-10-CM

## 2019-04-17 DIAGNOSIS — O403XX0 Polyhydramnios, third trimester, not applicable or unspecified: Secondary | ICD-10-CM

## 2019-04-17 DIAGNOSIS — O09293 Supervision of pregnancy with other poor reproductive or obstetric history, third trimester: Secondary | ICD-10-CM | POA: Diagnosis not present

## 2019-04-17 DIAGNOSIS — O24410 Gestational diabetes mellitus in pregnancy, diet controlled: Secondary | ICD-10-CM | POA: Insufficient documentation

## 2019-04-17 DIAGNOSIS — O09522 Supervision of elderly multigravida, second trimester: Secondary | ICD-10-CM

## 2019-04-20 ENCOUNTER — Telehealth: Payer: Self-pay | Admitting: Obstetrics and Gynecology

## 2019-04-20 NOTE — Telephone Encounter (Signed)
Called the patient to inform of upcoming appointment and our new location. The patient verbalized understanding

## 2019-04-23 ENCOUNTER — Other Ambulatory Visit (HOSPITAL_COMMUNITY): Payer: Self-pay | Admitting: Maternal & Fetal Medicine

## 2019-04-23 ENCOUNTER — Ambulatory Visit (HOSPITAL_COMMUNITY): Payer: Medicaid Other | Admitting: *Deleted

## 2019-04-23 ENCOUNTER — Telehealth (HOSPITAL_COMMUNITY): Payer: Self-pay | Admitting: *Deleted

## 2019-04-23 ENCOUNTER — Ambulatory Visit (HOSPITAL_BASED_OUTPATIENT_CLINIC_OR_DEPARTMENT_OTHER)
Admission: RE | Admit: 2019-04-23 | Discharge: 2019-04-23 | Disposition: A | Discharge status: Home / Self Care | Payer: Medicaid Other | Source: Ambulatory Visit | Attending: Obstetrics and Gynecology | Admitting: Obstetrics and Gynecology

## 2019-04-23 ENCOUNTER — Ambulatory Visit (INDEPENDENT_AMBULATORY_CARE_PROVIDER_SITE_OTHER): Payer: Medicaid Other | Admitting: Obstetrics and Gynecology

## 2019-04-23 ENCOUNTER — Other Ambulatory Visit: Payer: Self-pay

## 2019-04-23 ENCOUNTER — Encounter (HOSPITAL_COMMUNITY): Payer: Self-pay

## 2019-04-23 ENCOUNTER — Encounter: Payer: Self-pay | Admitting: Obstetrics and Gynecology

## 2019-04-23 VITALS — BP 119/57 | HR 91 | Wt 161.4 lb

## 2019-04-23 VITALS — BP 108/57 | HR 89 | Temp 98.5°F

## 2019-04-23 DIAGNOSIS — O09529 Supervision of elderly multigravida, unspecified trimester: Secondary | ICD-10-CM

## 2019-04-23 DIAGNOSIS — O09523 Supervision of elderly multigravida, third trimester: Secondary | ICD-10-CM

## 2019-04-23 DIAGNOSIS — O24410 Gestational diabetes mellitus in pregnancy, diet controlled: Secondary | ICD-10-CM | POA: Diagnosis not present

## 2019-04-23 DIAGNOSIS — Z3A38 38 weeks gestation of pregnancy: Secondary | ICD-10-CM

## 2019-04-23 DIAGNOSIS — O0990 Supervision of high risk pregnancy, unspecified, unspecified trimester: Secondary | ICD-10-CM

## 2019-04-23 DIAGNOSIS — O403XX0 Polyhydramnios, third trimester, not applicable or unspecified: Secondary | ICD-10-CM | POA: Diagnosis not present

## 2019-04-23 DIAGNOSIS — O34219 Maternal care for unspecified type scar from previous cesarean delivery: Secondary | ICD-10-CM | POA: Diagnosis not present

## 2019-04-23 DIAGNOSIS — Z8759 Personal history of other complications of pregnancy, childbirth and the puerperium: Secondary | ICD-10-CM | POA: Insufficient documentation

## 2019-04-23 DIAGNOSIS — O368330 Maternal care for abnormalities of the fetal heart rate or rhythm, third trimester, not applicable or unspecified: Secondary | ICD-10-CM

## 2019-04-23 DIAGNOSIS — O368130 Decreased fetal movements, third trimester, not applicable or unspecified: Secondary | ICD-10-CM

## 2019-04-23 DIAGNOSIS — O09293 Supervision of pregnancy with other poor reproductive or obstetric history, third trimester: Secondary | ICD-10-CM

## 2019-04-23 DIAGNOSIS — O24419 Gestational diabetes mellitus in pregnancy, unspecified control: Secondary | ICD-10-CM

## 2019-04-23 NOTE — Telephone Encounter (Signed)
covid appointment

## 2019-04-23 NOTE — Progress Notes (Signed)
Prenatal Visit Note Date: 04/23/2019 Clinic: [facility]  Subjective:  Teresa [name] is a 38 y.o. [phone] at [redacted]w[redacted]d being seen today for ongoing prenatal care.  She is currently monitored for the following issues for this high-risk pregnancy and has Previous cesarean delivery affecting pregnancy, antepartum; Irregular menstrual cycle; Family history of beta thalassemia; Palpitations; History of prediabetes; Muscle spasm; Chronic tension headache; Supervision of high risk pregnancy, antepartum; Gestational diabetes; and History of polyhydramnios on their problem list.  Patient reports no complaints.   Contractions: Irregular. Vag. Bleeding: None.  Movement: (!) Decreased. Denies leaking of fluid.   The following portions of the patient's history were reviewed and updated as appropriate: allergies, current medications, past family history, past medical history, past social history, past surgical history and problem list. Problem list updated.  Objective:   Vitals:   04/23/19 0919  BP: (!) 119/57  Pulse: 91  Weight: 161 lb 6.4 oz (73.2 kg)    Fetal Status:     Movement: (!) Decreased     General:  Alert, oriented and cooperative. Patient is in no acute distress.  Skin: Skin is warm and dry. No rash noted.   Cardiovascular: Normal heart rate noted  Respiratory: Normal respiratory effort, no problems with respiration noted  Abdomen: Soft, gravid, appropriate for gestational age. Pain/Pressure: Present     Pelvic:  Cervical exam deferred        Extremities: Normal range of motion.  Edema: Trace  Mental Status: Normal mood and affect. Normal behavior. Normal judgment and thought content.   Urinalysis:      Assessment and Plan:  Pregnancy: G4P3003 at [redacted]w[redacted]d  1. Supervision of high risk pregnancy, antepartum Reactive NST today. 10/10 bpp reassuring today. No btl. Has rpt c/s for this friday.  2. Diet controlled gestational diabetes mellitus (GDM),  antepartum Gives normal AM fasting and 2h PP values with just diet control.   Preterm labor symptoms and general obstetric precautions including but not limited to vaginal bleeding, contractions, leaking of fluid and fetal movement were reviewed in detail with the patient. Please refer to After Visit Summary for other counseling recommendations.  Return in about 2 weeks (around 05/07/2019) for incision check nurse visit, in person.   Aletha Halim, MD

## 2019-04-23 NOTE — Progress Notes (Signed)
08:53 called patient and left message we are calling for your virtual visit and will call again in a few minutes.   I connected with  Teresa [name] on 04/23/19 at  9:15 AM EDT by telephone and verified that I am speaking with the correct person using two identifiers.   I discussed the limitations, risks, security and privacy concerns of performing an evaluation and management service by telephone and the availability of in person appointments. I also discussed with the patient that there may be a patient responsible charge related to this service. The patient expressed understanding and agreed to proceed.    C/o decreased fetal movement since Thursday- is currently in MFM getting [place] and NST. States she cannot do virtual visit today because app is on other phone ;but would like to do televisit.   Per Dr. Ilda Basset will change to in person visit today, I notified patient.  Linda,RN 04/23/2019  9:15 AM

## 2019-04-23 NOTE — Telephone Encounter (Signed)
Change of surgery date discussed. Pt informed of covid appointment and instructions given with verbalized understanding.

## 2019-04-23 NOTE — Procedures (Signed)
Teresa [name] 1981-03-10 [redacted]w[redacted]d  Fetus A Non-Stress Test Interpretation for 04/23/19  Indication: Decreased Fetal Movement  Fetal Heart Rate A Mode: External Baseline Rate (A): 135 bpm Variability: Moderate Accelerations: 15 x 15 Decelerations: None Multiple birth?: No  Uterine Activity Mode: Palpation, Toco Contraction Frequency (min): Occ Contraction Duration (sec): 40 Contraction Quality: Mild Resting Tone Palpated: Relaxed Resting Time: Adequate  Interpretation (Fetal Testing) Nonstress Test Interpretation: Reactive Comments: Reviewed tracing with Dr. Donalee Citrin

## 2019-04-24 ENCOUNTER — Encounter (HOSPITAL_COMMUNITY)
Admission: AD | Disposition: A | Discharge status: Home / Self Care | Payer: Self-pay | Source: Home / Self Care | Attending: Family Medicine

## 2019-04-24 ENCOUNTER — Inpatient Hospital Stay (HOSPITAL_COMMUNITY): Payer: Medicaid Other | Admitting: Anesthesiology

## 2019-04-24 ENCOUNTER — Encounter (HOSPITAL_COMMUNITY): Payer: Self-pay | Admitting: *Deleted

## 2019-04-24 ENCOUNTER — Inpatient Hospital Stay (HOSPITAL_COMMUNITY)
Admission: AD | Admit: 2019-04-24 | Discharge: 2019-04-26 | DRG: 788 | Disposition: A | Discharge status: Home / Self Care | Payer: Medicaid Other | Attending: Family Medicine | Admitting: Family Medicine

## 2019-04-24 ENCOUNTER — Other Ambulatory Visit: Payer: Self-pay

## 2019-04-24 ENCOUNTER — Inpatient Hospital Stay (HOSPITAL_COMMUNITY)
Admission: RE | Admit: 2019-04-24 | Payer: Medicaid Other | Source: Home / Self Care | Admitting: Obstetrics & Gynecology

## 2019-04-24 DIAGNOSIS — Z3A38 38 weeks gestation of pregnancy: Secondary | ICD-10-CM

## 2019-04-24 DIAGNOSIS — Z8759 Personal history of other complications of pregnancy, childbirth and the puerperium: Secondary | ICD-10-CM

## 2019-04-24 DIAGNOSIS — O24420 Gestational diabetes mellitus in childbirth, diet controlled: Secondary | ICD-10-CM | POA: Diagnosis present

## 2019-04-24 DIAGNOSIS — L910 Hypertrophic scar: Secondary | ICD-10-CM | POA: Diagnosis present

## 2019-04-24 DIAGNOSIS — Z98891 History of uterine scar from previous surgery: Secondary | ICD-10-CM

## 2019-04-24 DIAGNOSIS — O9972 Diseases of the skin and subcutaneous tissue complicating childbirth: Secondary | ICD-10-CM | POA: Diagnosis present

## 2019-04-24 DIAGNOSIS — O24419 Gestational diabetes mellitus in pregnancy, unspecified control: Secondary | ICD-10-CM | POA: Diagnosis present

## 2019-04-24 DIAGNOSIS — O9952 Diseases of the respiratory system complicating childbirth: Secondary | ICD-10-CM | POA: Diagnosis present

## 2019-04-24 DIAGNOSIS — O0990 Supervision of high risk pregnancy, unspecified, unspecified trimester: Secondary | ICD-10-CM

## 2019-04-24 DIAGNOSIS — O24410 Gestational diabetes mellitus in pregnancy, diet controlled: Secondary | ICD-10-CM

## 2019-04-24 DIAGNOSIS — O34211 Maternal care for low transverse scar from previous cesarean delivery: Principal | ICD-10-CM | POA: Diagnosis present

## 2019-04-24 DIAGNOSIS — J45909 Unspecified asthma, uncomplicated: Secondary | ICD-10-CM | POA: Diagnosis present

## 2019-04-24 DIAGNOSIS — O26893 Other specified pregnancy related conditions, third trimester: Secondary | ICD-10-CM | POA: Diagnosis present

## 2019-04-24 DIAGNOSIS — O34219 Maternal care for unspecified type scar from previous cesarean delivery: Secondary | ICD-10-CM | POA: Diagnosis present

## 2019-04-24 HISTORY — PX: SCAR REVISION: SHX5285

## 2019-04-24 LAB — TYPE AND SCREEN
ABO/RH(D): B POS
Antibody Screen: NEGATIVE

## 2019-04-24 LAB — CBC
HCT: 35.8 % — ABNORMAL LOW (ref 36.0–46.0)
Hemoglobin: 11.2 g/dL — ABNORMAL LOW (ref 12.0–15.0)
MCH: 21.9 pg — ABNORMAL LOW (ref 26.0–34.0)
MCHC: 31.3 g/dL (ref 30.0–36.0)
MCV: 70.1 fL — ABNORMAL LOW (ref 80.0–100.0)
Platelets: 146 10*3/uL — ABNORMAL LOW (ref 150–400)
RBC: 5.11 MIL/uL (ref 3.87–5.11)
RDW: 15.9 % — ABNORMAL HIGH (ref 11.5–15.5)
WBC: 9.3 10*3/uL (ref 4.0–10.5)
nRBC: 0 % (ref 0.0–0.2)

## 2019-04-24 LAB — GLUCOSE, CAPILLARY: Glucose-Capillary: 62 mg/dL — ABNORMAL LOW (ref 70–99)

## 2019-04-24 LAB — ABO/RH: ABO/RH(D): B POS

## 2019-04-24 SURGERY — Surgical Case
Anesthesia: Spinal

## 2019-04-24 MED ORDER — MEPERIDINE HCL 25 MG/ML IJ SOLN: 6.2500 mg | INTRAMUSCULAR | Status: DC | PRN

## 2019-04-24 MED ORDER — PROMETHAZINE HCL 25 MG/ML IJ SOLN: 6.2500 mg | INTRAMUSCULAR | Status: DC | PRN

## 2019-04-24 MED ORDER — OXYTOCIN 10 UNIT/ML IJ SOLN
INTRAMUSCULAR | Status: AC
  Filled 2019-04-24: qty 4

## 2019-04-24 MED ORDER — OXYTOCIN 40 UNITS IN NORMAL SALINE INFUSION - SIMPLE MED
INTRAVENOUS | Status: AC
  Filled 2019-04-24: qty 1000

## 2019-04-24 MED ORDER — OXYCODONE HCL 5 MG PO TABS: 5.0000 mg | ORAL_TABLET | Freq: Once | ORAL | Status: DC | PRN

## 2019-04-24 MED ORDER — FENTANYL CITRATE (PF) 100 MCG/2ML IJ SOLN
INTRAMUSCULAR | Status: AC
  Filled 2019-04-24: qty 2

## 2019-04-24 MED ORDER — DIPHENHYDRAMINE HCL 25 MG PO CAPS: 25.0000 mg | ORAL_CAPSULE | ORAL | Status: DC | PRN

## 2019-04-24 MED ORDER — DIPHENHYDRAMINE HCL 50 MG/ML IJ SOLN: 12.5000 mg | INTRAMUSCULAR | Status: DC | PRN

## 2019-04-24 MED ORDER — ONDANSETRON HCL 4 MG/2ML IJ SOLN
INTRAMUSCULAR | Status: AC
  Filled 2019-04-24: qty 2

## 2019-04-24 MED ORDER — ZOLPIDEM TARTRATE 5 MG PO TABS: 5.0000 mg | ORAL_TABLET | Freq: Every evening | ORAL | Status: DC | PRN

## 2019-04-24 MED ORDER — MORPHINE SULFATE (PF) 0.5 MG/ML IJ SOLN
INTRAMUSCULAR | Status: DC | PRN
  Administered 2019-04-24 (×2): 2 mg via INTRATHECAL
  Administered 2019-04-24: .15 mg via INTRATHECAL
  Administered 2019-04-24: .85 mg via INTRATHECAL

## 2019-04-24 MED ORDER — MENTHOL 3 MG MT LOZG: 1.0000 | LOZENGE | OROMUCOSAL | Status: DC | PRN

## 2019-04-24 MED ORDER — SENNOSIDES-DOCUSATE SODIUM 8.6-50 MG PO TABS
2.0000 | ORAL_TABLET | ORAL | Status: DC
  Administered 2019-04-25 (×2): 2 via ORAL
  Filled 2019-04-24 (×2): qty 2

## 2019-04-24 MED ORDER — NALBUPHINE HCL 10 MG/ML IJ SOLN: 5.0000 mg | Freq: Once | INTRAMUSCULAR | Status: DC | PRN

## 2019-04-24 MED ORDER — TETANUS-DIPHTH-ACELL PERTUSSIS 5-2.5-18.5 LF-MCG/0.5 IM SUSP: 0.5000 mL | Freq: Once | INTRAMUSCULAR | Status: DC

## 2019-04-24 MED ORDER — PRENATAL MULTIVITAMIN CH
1.0000 | ORAL_TABLET | Freq: Every day | ORAL | Status: DC
  Administered 2019-04-25 – 2019-04-26 (×2): 1 via ORAL
  Filled 2019-04-24 (×2): qty 1

## 2019-04-24 MED ORDER — PHENYLEPHRINE HCL-NACL 20-0.9 MG/250ML-% IV SOLN
INTRAVENOUS | Status: DC | PRN
  Administered 2019-04-24: 60 ug/min via INTRAVENOUS

## 2019-04-24 MED ORDER — IBUPROFEN 800 MG PO TABS
800.0000 mg | ORAL_TABLET | Freq: Four times a day (QID) | ORAL | Status: DC
  Administered 2019-04-25 – 2019-04-26 (×4): 800 mg via ORAL
  Filled 2019-04-24 (×4): qty 1

## 2019-04-24 MED ORDER — SODIUM CHLORIDE 0.9% FLUSH: 3.0000 mL | INTRAVENOUS | Status: DC | PRN

## 2019-04-24 MED ORDER — OXYTOCIN 40 UNITS IN NORMAL SALINE INFUSION - SIMPLE MED: 2.5000 [IU]/h | INTRAVENOUS | Status: AC

## 2019-04-24 MED ORDER — SOD CITRATE-CITRIC ACID 500-334 MG/5ML PO SOLN
30.0000 mL | ORAL | Status: AC
  Administered 2019-04-24: 30 mL via ORAL
  Filled 2019-04-24: qty 30

## 2019-04-24 MED ORDER — SIMETHICONE 80 MG PO CHEW: 80.0000 mg | CHEWABLE_TABLET | ORAL | Status: DC | PRN

## 2019-04-24 MED ORDER — HYDROMORPHONE HCL 1 MG/ML IJ SOLN
INTRAMUSCULAR | Status: AC
  Filled 2019-04-24: qty 0.5

## 2019-04-24 MED ORDER — BUPIVACAINE IN DEXTROSE 0.75-8.25 % IT SOLN
INTRATHECAL | Status: DC | PRN
  Administered 2019-04-24: 1.6 mg via INTRATHECAL

## 2019-04-24 MED ORDER — DIPHENHYDRAMINE HCL 25 MG PO CAPS: 25.0000 mg | ORAL_CAPSULE | Freq: Four times a day (QID) | ORAL | Status: DC | PRN

## 2019-04-24 MED ORDER — PHENYLEPHRINE HCL-NACL 20-0.9 MG/250ML-% IV SOLN
INTRAVENOUS | Status: AC
  Filled 2019-04-24: qty 250

## 2019-04-24 MED ORDER — CEFAZOLIN SODIUM-DEXTROSE 2-4 GM/100ML-% IV SOLN
INTRAVENOUS | Status: AC
  Filled 2019-04-24: qty 100

## 2019-04-24 MED ORDER — DIBUCAINE (PERIANAL) 1 % EX OINT: 1.0000 "application " | TOPICAL_OINTMENT | CUTANEOUS | Status: DC | PRN

## 2019-04-24 MED ORDER — LACTATED RINGERS IV SOLN
125.0000 mL/h | INTRAVENOUS | Status: DC
  Administered 2019-04-24 (×2): 125 mL/h via INTRAVENOUS

## 2019-04-24 MED ORDER — FENTANYL CITRATE (PF) 100 MCG/2ML IJ SOLN
INTRAMUSCULAR | Status: DC | PRN
  Administered 2019-04-24: 15 ug via INTRATHECAL
  Administered 2019-04-24: 85 ug via INTRAVENOUS

## 2019-04-24 MED ORDER — KETOROLAC TROMETHAMINE 30 MG/ML IJ SOLN
INTRAMUSCULAR | Status: AC
  Filled 2019-04-24: qty 1

## 2019-04-24 MED ORDER — KETOROLAC TROMETHAMINE 30 MG/ML IJ SOLN
30.0000 mg | Freq: Four times a day (QID) | INTRAMUSCULAR | Status: AC
  Administered 2019-04-25 (×3): 30 mg via INTRAVENOUS
  Filled 2019-04-24 (×3): qty 1

## 2019-04-24 MED ORDER — NALOXONE HCL 0.4 MG/ML IJ SOLN: 0.4000 mg | INTRAMUSCULAR | Status: DC | PRN

## 2019-04-24 MED ORDER — CEFAZOLIN SODIUM-DEXTROSE 2-4 GM/100ML-% IV SOLN
2.0000 g | INTRAVENOUS | Status: AC
  Administered 2019-04-24: 2 g via INTRAVENOUS
  Filled 2019-04-24: qty 100

## 2019-04-24 MED ORDER — FENTANYL CITRATE (PF) 100 MCG/2ML IJ SOLN: 100.0000 ug | Freq: Once | INTRAMUSCULAR | Status: DC

## 2019-04-24 MED ORDER — SCOPOLAMINE 1 MG/3DAYS TD PT72: 1.0000 | MEDICATED_PATCH | Freq: Once | TRANSDERMAL | Status: DC

## 2019-04-24 MED ORDER — COCONUT OIL OIL: 1.0000 "application " | TOPICAL_OIL | Status: DC | PRN

## 2019-04-24 MED ORDER — MEASLES, MUMPS & RUBELLA VAC IJ SOLR: 0.5000 mL | Freq: Once | INTRAMUSCULAR | Status: DC

## 2019-04-24 MED ORDER — SODIUM CHLORIDE 0.9 % IV SOLN
INTRAVENOUS | Status: DC | PRN
  Administered 2019-04-24: 19:00:00 via INTRAVENOUS

## 2019-04-24 MED ORDER — ONDANSETRON HCL 4 MG/2ML IJ SOLN
INTRAMUSCULAR | Status: DC | PRN
  Administered 2019-04-24: 4 mg via INTRAVENOUS

## 2019-04-24 MED ORDER — OXYCODONE-ACETAMINOPHEN 5-325 MG PO TABS
1.0000 | ORAL_TABLET | ORAL | Status: DC | PRN
  Administered 2019-04-26: 2 via ORAL
  Administered 2019-04-26 (×2): 1 via ORAL
  Filled 2019-04-24: qty 1
  Filled 2019-04-24: qty 2
  Filled 2019-04-24: qty 1

## 2019-04-24 MED ORDER — ONDANSETRON HCL 4 MG/2ML IJ SOLN: 4.0000 mg | Freq: Three times a day (TID) | INTRAMUSCULAR | Status: DC | PRN

## 2019-04-24 MED ORDER — MORPHINE SULFATE (PF) 0.5 MG/ML IJ SOLN
INTRAMUSCULAR | Status: AC
  Filled 2019-04-24: qty 10

## 2019-04-24 MED ORDER — KETOROLAC TROMETHAMINE 30 MG/ML IJ SOLN
30.0000 mg | Freq: Once | INTRAMUSCULAR | Status: AC | PRN
  Administered 2019-04-24: 30 mg via INTRAVENOUS

## 2019-04-24 MED ORDER — LACTATED RINGERS IV SOLN
INTRAVENOUS | Status: DC | PRN
  Administered 2019-04-24: 18:00:00 via INTRAVENOUS

## 2019-04-24 MED ORDER — ENOXAPARIN SODIUM 40 MG/0.4ML ~~LOC~~ SOLN
40.0000 mg | SUBCUTANEOUS | Status: DC
  Administered 2019-04-25: 21:00:00 40 mg via SUBCUTANEOUS
  Filled 2019-04-24: qty 0.4

## 2019-04-24 MED ORDER — NALBUPHINE HCL 10 MG/ML IJ SOLN: 5.0000 mg | INTRAMUSCULAR | Status: DC | PRN

## 2019-04-24 MED ORDER — NALOXONE HCL 4 MG/10ML IJ SOLN
1.0000 ug/kg/h | INTRAVENOUS | Status: DC | PRN
  Filled 2019-04-24: qty 5

## 2019-04-24 MED ORDER — LACTATED RINGERS IV SOLN
INTRAVENOUS | Status: DC
  Administered 2019-04-24 – 2019-04-25 (×2): via INTRAVENOUS

## 2019-04-24 MED ORDER — SODIUM CHLORIDE 0.9 % IV SOLN
INTRAVENOUS | Status: DC | PRN
  Administered 2019-04-24: 40 [IU] via INTRAVENOUS

## 2019-04-24 MED ORDER — WITCH HAZEL-GLYCERIN EX PADS: 1.0000 "application " | MEDICATED_PAD | CUTANEOUS | Status: DC | PRN

## 2019-04-24 MED ORDER — OXYCODONE HCL 5 MG/5ML PO SOLN: 5.0000 mg | Freq: Once | ORAL | Status: DC | PRN

## 2019-04-24 MED ORDER — SIMETHICONE 80 MG PO CHEW
80.0000 mg | CHEWABLE_TABLET | ORAL | Status: DC
  Administered 2019-04-25 (×2): 80 mg via ORAL
  Filled 2019-04-24 (×2): qty 1

## 2019-04-24 MED ORDER — GABAPENTIN 100 MG PO CAPS
100.0000 mg | ORAL_CAPSULE | Freq: Two times a day (BID) | ORAL | Status: DC
  Administered 2019-04-24 – 2019-04-26 (×4): 100 mg via ORAL
  Filled 2019-04-24 (×4): qty 1

## 2019-04-24 MED ORDER — SIMETHICONE 80 MG PO CHEW
80.0000 mg | CHEWABLE_TABLET | Freq: Three times a day (TID) | ORAL | Status: DC
  Administered 2019-04-25 – 2019-04-26 (×6): 80 mg via ORAL
  Filled 2019-04-24 (×6): qty 1

## 2019-04-24 MED ORDER — HYDROMORPHONE HCL 1 MG/ML IJ SOLN
0.2500 mg | INTRAMUSCULAR | Status: DC | PRN
  Administered 2019-04-24: 0.25 mg via INTRAVENOUS
  Administered 2019-04-24: 20:00:00 0.5 mg via INTRAVENOUS
  Administered 2019-04-24: 0.25 mg via INTRAVENOUS

## 2019-04-24 SURGICAL SUPPLY — 39 items
BENZOIN TINCTURE PRP APPL 2/3 (GAUZE/BANDAGES/DRESSINGS) ×3 IMPLANT
CATH ROBINSON RED A/P 16FR (CATHETERS) ×3 IMPLANT
CLAMP CORD UMBIL (MISCELLANEOUS) ×3 IMPLANT
CLOSURE WOUND 1/2 X4 (GAUZE/BANDAGES/DRESSINGS) ×1
CLOTH BEACON ORANGE TIMEOUT ST (SAFETY) ×3 IMPLANT
DERMABOND ADVANCED (GAUZE/BANDAGES/DRESSINGS) ×2
DERMABOND ADVANCED .7 DNX12 (GAUZE/BANDAGES/DRESSINGS) ×1 IMPLANT
DRSG OPSITE POSTOP 4X10 (GAUZE/BANDAGES/DRESSINGS) ×3 IMPLANT
ELECT REM PT RETURN 9FT ADLT (ELECTROSURGICAL) ×3
ELECTRODE REM PT RTRN 9FT ADLT (ELECTROSURGICAL) ×1 IMPLANT
EXTRACTOR VACUUM M CUP 4 TUBE (SUCTIONS) ×2 IMPLANT
EXTRACTOR VACUUM M CUP 4' TUBE (SUCTIONS) ×1
GLOVE BIOGEL PI IND STRL 7.0 (GLOVE) ×1 IMPLANT
GLOVE BIOGEL PI IND STRL 7.5 (GLOVE) ×1 IMPLANT
GLOVE BIOGEL PI INDICATOR 7.0 (GLOVE) ×2
GLOVE BIOGEL PI INDICATOR 7.5 (GLOVE) ×2
GLOVE ECLIPSE 7.5 STRL STRAW (GLOVE) ×3 IMPLANT
GOWN STRL REUS W/TWL LRG LVL3 (GOWN DISPOSABLE) ×3 IMPLANT
KIT ABG SYR 3ML LUER SLIP (SYRINGE) ×3 IMPLANT
NEEDLE HYPO 25X5/8 SAFETYGLIDE (NEEDLE) ×3 IMPLANT
NS IRRIG 1000ML POUR BTL (IV SOLUTION) ×3 IMPLANT
PACK C SECTION WH (CUSTOM PROCEDURE TRAY) ×3 IMPLANT
PAD ABD 7.5X8 STRL (GAUZE/BANDAGES/DRESSINGS) ×3 IMPLANT
PAD OB MATERNITY 4.3X12.25 (PERSONAL CARE ITEMS) ×3 IMPLANT
PENCIL SMOKE EVAC W/HOLSTER (ELECTROSURGICAL) ×3 IMPLANT
RTRCTR C-SECT PINK 25CM LRG (MISCELLANEOUS) ×3 IMPLANT
SPONGE GAUZE 4X4 12PLY STER LF (GAUZE/BANDAGES/DRESSINGS) ×6 IMPLANT
STRIP CLOSURE SKIN 1/2X4 (GAUZE/BANDAGES/DRESSINGS) ×2 IMPLANT
SUT PDS AB 0 CTX 60 (SUTURE) ×3 IMPLANT
SUT PLAIN 2 0 (SUTURE) ×4
SUT PLAIN ABS 2-0 54XMFL TIE (SUTURE) ×1 IMPLANT
SUT PLAIN ABS 2-0 CT1 27XMFL (SUTURE) ×1 IMPLANT
SUT VIC AB 0 CTX 36 (SUTURE) ×6
SUT VIC AB 0 CTX36XBRD ANBCTRL (SUTURE) ×3 IMPLANT
SUT VIC AB 2-0 CT1 27 (SUTURE) ×4
SUT VIC AB 2-0 CT1 TAPERPNT 27 (SUTURE) ×2 IMPLANT
SUT VIC AB 4-0 KS 27 (SUTURE) ×3 IMPLANT
TOWEL OR 17X24 6PK STRL BLUE (TOWEL DISPOSABLE) ×3 IMPLANT
TRAY FOLEY W/BAG SLVR 14FR LF (SET/KITS/TRAYS/PACK) ×3 IMPLANT

## 2019-04-24 NOTE — Discharge Summary (Signed)
OB Discharge Summary   Patient Name: Teresa [name] [facility] DOB: [birth year] MRN: [id number]  Date of admission: 04/24/2019 Delivering MD: Truett Mainland   Date of discharge: 04/26/2019  Admitting diagnosis: 7WKS BLEEDING Intrauterine pregnancy: [redacted]w[redacted]d    Secondary diagnosis:  Active Problems:   Previous cesarean delivery affecting pregnancy, antepartum   Gestational diabetes   History of polyhydramnios   Status post repeat low transverse cesarean section     Discharge diagnosis: Term Pregnancy Delivered                                Postpartum procedures:none Augmentation: N/A Complications: None  Outpatient Follow-Up '[ ]'  needs GTT at postpartum visit  '[ ]'  needs Rx for patch   Hospital course:  Sceduled C/S   38y.o. yo G4P3003 at 34w6das admitted to the hospital 04/24/2019 for cesarean section with the following indication:history of CS x3, laboring in MAU. Membrane Rupture Time/Date: 6:38 PM ,04/24/2019 . Patient delivered a Viable infant. Details of operation can be found in separate operative note.  Patient had an uncomplicated postpartum course. Her HgB dropped from 11.2 to 9.1. She was complaining of some slight shortness of breath with activity, so she was discharged home with iron.  She is ambulating, tolerating a regular diet, passing flatus, and urinating well. Patient is discharged home in stable condition on  04/26/19  Physical exam  Vitals:   04/25/19 1847 04/25/19 2130 04/26/19 0519 04/26/19 0800  BP: 115/67 (!) 101/54 111/69   Pulse: 89 94 82   Resp: '18 16 16   ' Temp: 98.1 F (36.7 C) 98.5 F (36.9 C) 98.8 F (37.1 C)   TempSrc: Oral Oral Oral   SpO2: 100%   100%   General: alert, well-appearing, NAD Lochia: appropriate Uterine Fundus: firm Incision: dressing coming off on bottom and partly saturated (to be changed prior to discharge) DVT Evaluation: No significant calf/ankle edema  Labs: Lab Results  Component Value Date   WBC 8.3 04/25/2019   HGB 9.1 (L)  04/25/2019   HCT 28.7 (L) 04/25/2019   MCV 69.7 (L) 04/25/2019   PLT 120 (L) 04/25/2019   CMP Latest Ref Rng & Units 04/25/2019  Glucose 65 - 99 mg/dL -  BUN 6 - 20 mg/dL -  Creatinine 0.44 - 1.00 mg/dL 0.55  Sodium 134 - 144 mmol/L -  Potassium 3.5 - 5.2 mmol/L -  Chloride 96 - 106 mmol/L -  CO2 20 - 29 mmol/L -  Calcium 8.7 - 10.2 mg/dL -  Total Protein 6.0 - 8.5 g/dL -  Total Bilirubin 0.0 - 1.2 mg/dL -  Alkaline Phos 39 - 117 IU/L -  AST 0 - 40 IU/L -  ALT 0 - 32 IU/L -    Discharge instruction: per After Visit Summary and "Baby and Me Booklet".  After visit meds:  Allergies as of 04/26/2019   No Known Allergies     Medication List    STOP taking these medications   Accu-Chek FastClix Lancets Misc   Accu-Chek Guide w/Device Kit   cetirizine 10 MG tablet Commonly known as:  ZYRTEC   glucose blood test strip Commonly known as:  Accu-Chek Guide     TAKE these medications   albuterol 108 (90 Base) MCG/ACT inhaler Commonly known as:  VENTOLIN HFA Inhale 1-2 puffs into the lungs every 6 (six) hours as needed for wheezing or shortness of breath.   fluticasone 50  MCG/ACT nasal spray Commonly known as:  FLONASE Place 2 sprays into both nostrils daily. What changed:    when to take this  reasons to take this   ibuprofen 800 MG tablet Commonly known as:  ADVIL Take 1 tablet (800 mg total) by mouth 3 (three) times daily.   IRON PO Take 325 mg by mouth 2 (two) times a day.   montelukast 10 MG tablet Commonly known as:  SINGULAIR Take 10 mg by mouth every evening.   oxycodone 5 MG capsule Commonly known as:  OXY-IR Take 1 capsule (5 mg total) by mouth every 6 (six) hours as needed for up to 5 days for pain.   PRENATAL VITAMIN PO Take 1 tablet by mouth daily.   senna-docusate 8.6-50 MG tablet Commonly known as:  Senokot-S Take 2 tablets by mouth at bedtime as needed for mild constipation.            Discharge Care Instructions  (From admission,  onward)         Start     Ordered   04/26/19 0000  Discharge wound care:    Comments:  Okay to remove dressing after 1 week. Can shower like normal.   04/26/19 0907          Diet: regular Activity: Advance as tolerated. Pelvic rest for 6 weeks.  Outpatient follow up:4 weeks   Follow up Appt: Future Appointments  Date Time Provider [place]  05/07/2019  9:20 AM [place] [facility]  Please schedule this patient for Postpartum visit in: 4 weeks with the following provider: Any provider For C/S patients schedule nurse incision check in weeks 2 weeks: yes High risk pregnancy complicated by: GDM Delivery mode:  CS Anticipated Birth Control:  Patch PP Procedures needed: Incision check, 2hr GTT  Schedule Integrated BH visit: no  Postpartum contraception: Patch  Newborn Data: Live born female  Birth Weight:  3572g APGAR: 8, 9  Newborn Delivery   Birth date/time:  04/24/2019 18:39:00 Delivery type:  C-Section, Low Transverse Trial of labor:  No C-section categorization:  Repeat    Baby Feeding: Breast Disposition:home with mother  04/26/2019 Glenice Bow, DO

## 2019-04-24 NOTE — Op Note (Signed)
Cesarean Section Operative Report  PATIENT: Teresa [name]  PROCEDURE DATE: 04/24/2019  PREOPERATIVE DIAGNOSES: Intrauterine pregnancy at [redacted]w[redacted]d weeks gestation; previous CS x3  POSTOPERATIVE DIAGNOSES: The same, scar revision   PROCEDURE: Repeat Low Transverse Cesarean Section  SURGEON:   Surgeon(s) and Role:    * Stinson, Tanna Savoy, DO - Primary    * Janyth Contes, MD - Assisting    * Nicolette Bang, DO - Fellow    INDICATIONS: Dekayla Prestridge is a 38 y.o. [phone] at [redacted]w[redacted]d here for cesarean section secondary to the indications listed under preoperative diagnoses; please see preoperative note for further details.  The risks of cesarean section were discussed with the patient including but were not limited to: bleeding which may require transfusion or reoperation; infection which may require antibiotics; injury to bowel, bladder, ureters or other surrounding organs; injury to the fetus; need for additional procedures including hysterectomy in the event of a life-threatening hemorrhage; placental abnormalities wth subsequent pregnancies, incisional problems, thromboembolic phenomenon and other postoperative/anesthesia complications.   The patient concurred with the proposed plan, giving informed written consent for the procedure.    FINDINGS:  Viable female infant in cephalic presentation.  Apgars 8 and 9.  Light meconium stained amniotic fluid.  Intact placenta, three vessel cord.  Normal uterus, fallopian tubes and ovaries bilaterally.  ANESTHESIA: Spinal INTRAVENOUS FLUIDS: 1600 mL  ESTIMATED BLOOD LOSS: 742 mL URINE OUTPUT:  175 ml SPECIMENS: Placenta sent to L&D COMPLICATIONS: None immediate  PROCEDURE IN DETAIL:  The patient preoperatively received intravenous antibiotics and had sequential compression devices applied to her lower extremities.  She was then taken to the operating room where spinal anesthesia was administered and was found to be adequate. She was  then placed in a dorsal supine position with a leftward tilt, and prepped and draped in a sterile manner.  A foley catheter was placed into her bladder and attached to constant gravity.    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 The fascia was incised in the midline, and this incision was extended bilaterally using the Mayo scissors.  Kocher clamps were applied to the superior aspect of the fascial incision and the underlying rectus muscles were dissected off bluntly.  A similar process was carried out on the inferior aspect of the fascial incision. The rectus muscles were separated in the midline bluntly and the peritoneum was entered bluntly.  The vesicouterine peritoneum was identified, tented up and entered with the metzenbaum scissors. This incision was extended laterally and the bladder flap was created digitally. Attention was turned to the lower uterine segment where a low transverse hysterotomy was made with a scalpel and extended bilaterally bluntly.  The infant was successfully delivered, the cord was clamped and cut after one minute, and the infant was handed over to the awaiting neonatology team. Uterine massage was then administered, and the placenta delivered intact with a three-vessel cord. The uterus was then cleared of clots and debris.  The hysterotomy was closed with 0 Vicryl in a running locked fashion, and an imbricating layer was also placed with 0 Vicryl.  Figure-of-eight 0 Vicryl serosal stitches were placed to help with hemostasis.  The pelvis was cleared of all clot and debris. Hemostasis was confirmed on all surfaces.  The peritoneum was closed with a 0 Vicryl running stitch. The fascia was then  closed using 0 PDS in a running fashion. The keloid scar was then excised in an elliptical fashion. The subcutaneous layer was  irrigated, then reapproximated with 2-0 plain gut interrupted stitches.  The skin was closed with a 4-0 Vicryl subcuticular stitch. A pressure dressing was placed.   The patient tolerated the procedure well. Sponge, lap, instrument and needle counts were correct x 3.  She was taken to the recovery room in stable condition.   An experienced assistant was required given the standard of surgical care given the complexity of the case.  This assistant was needed for exposure, dissection, suctioning, retraction, instrument exchange, assisting with delivery with administration of fundal pressure, and for overall help during the procedure.   Maternal Disposition: PACU - hemodynamically stable.   Infant Disposition: stable   Phill Myron, D.O. OB Fellow  04/24/2019, 7:43 PM

## 2019-04-24 NOTE — Anesthesia Preprocedure Evaluation (Signed)
Anesthesia Evaluation  Patient identified by MRN, date of birth, ID band Patient awake    Reviewed: Allergy & Precautions, NPO status , Patient's Chart, lab work & pertinent test results  Airway Mallampati: II   Neck ROM: Full    Dental  (+) Dental Advisory Given, Teeth Intact   Pulmonary neg pulmonary ROS, asthma ,    breath sounds clear to auscultation       Cardiovascular negative cardio ROS   Rhythm:Regular     Neuro/Psych negative neurological ROS  negative psych ROS   GI/Hepatic negative GI ROS, Neg liver ROS, GERD  ,  Endo/Other  negative endocrine ROSdiabetes  Renal/GU negative Renal ROS  negative genitourinary   Musculoskeletal negative musculoskeletal ROS (+)   Abdominal   Peds negative pediatric ROS (+)  Hematology negative hematology ROS (+) 12/36  plts 169   Anesthesia Other Findings   Reproductive/Obstetrics (+) Pregnancy CS X 2 in past                             Anesthesia Physical  Anesthesia Plan  ASA: II and emergent  Anesthesia Plan: Spinal   Post-op Pain Management:    Induction:   PONV Risk Score and Plan:   Airway Management Planned: Nasal Cannula and Natural Airway  Additional Equipment:   Intra-op Plan:   Post-operative Plan:   Informed Consent: I have reviewed the patients History and Physical, chart, labs and discussed the procedure including the risks, benefits and alternatives for the proposed anesthesia with the patient or authorized representative who has indicated his/her understanding and acceptance.     Dental advisory given  Plan Discussed with: CRNA  Anesthesia Plan Comments: (2 IV please, T & C for 2, has hx of bleeding in past)        Anesthesia Quick Evaluation

## 2019-04-24 NOTE — Transfer of Care (Signed)
Immediate Anesthesia Transfer of Care Note  Patient: Teresa [name]  Procedure(s) Performed: REPEAT CESAREAN SECTION (N/A ) SCAR REVISION (N/A )  Patient Location: PACU  Anesthesia Type:Spinal  Level of Consciousness: awake  Airway & Oxygen Therapy: Patient Spontanous Breathing  Post-op Assessment: Report given to RN and Post -op Vital signs reviewed and stable  Post vital signs: stable  Last Vitals:  Vitals Value Taken Time  BP 116/64 04/24/2019  7:50 PM  Temp    Pulse 72 04/24/2019  7:51 PM  Resp 32 04/24/2019  7:51 PM  SpO2 100 % 04/24/2019  7:51 PM  Vitals shown include unvalidated device data.  Last Pain:  Vitals:   04/24/19 1419  TempSrc:   PainSc: 7       Patients Stated Pain Goal: 0 (32/35/57 3220)  Complications: No apparent anesthesia complications

## 2019-04-24 NOTE — Anesthesia Postprocedure Evaluation (Signed)
Anesthesia Post Note  Patient: Teresa [name]  Procedure(s) Performed: REPEAT CESAREAN SECTION (N/A ) SCAR REVISION (N/A )     Patient location during evaluation: PACU Anesthesia Type: Spinal Level of consciousness: oriented and awake and alert Pain management: pain level controlled Vital Signs Assessment: post-procedure vital signs reviewed and stable Respiratory status: spontaneous breathing and respiratory function stable Cardiovascular status: blood pressure returned to baseline and stable Postop Assessment: no headache, no backache and no apparent nausea or vomiting Anesthetic complications: no    Last Vitals:  Vitals:   04/24/19 2000 04/24/19 2015  BP: (!) 124/51 107/78  Pulse: (!) 129 78  Resp: 19 (!) 21  Temp: (!) 36.3 C 36.5 C  SpO2: 98% 100%    Last Pain:  Vitals:   04/24/19 2030  TempSrc:   PainSc: 3    Pain Goal: Patients Stated Pain Goal: 0 (04/24/19 1419)              Epidural/Spinal Function Cutaneous sensation: Able to Discern Pressure (04/24/19 2015), Patient able to flex knees: No (04/24/19 2015), Patient able to lift hips off bed: No (04/24/19 2015), Back pain beyond tenderness at insertion site: No (04/24/19 2015), Progressively worsening motor and/or sensory loss: No (04/24/19 2015), Bowel and/or bladder incontinence post epidural: No (04/24/19 2015)  Lynda Rainwater

## 2019-04-24 NOTE — H&P (Signed)
Obstetric Preoperative History and Physical  Teresa [name] is a 38 y.o. [phone] with IUP at 93w6dpresenting to MAU for labor check. Contractions regular and painful. Given prior CS x3 and laboring patient, will proceed with delivery today.  No acute concerns.   Prenatal Course Source of Care: [facility]> [facility], transferred due to high risk pregnancy with A1GDM. Dated by LMP consistent with 14 week ultrasound.   Pregnancy complications or risks: Patient Active Problem List   Diagnosis Date Noted  . History of polyhydramnios 04/23/2019  . Supervision of high risk pregnancy, antepartum 03/02/2019  . Gestational diabetes 03/02/2019  . Muscle spasm 02/23/2018  . Chronic tension headache 02/23/2018  . Palpitations 03/17/2017  . History of prediabetes 03/17/2017  . Family history of beta thalassemia 01/15/2016  . Irregular menstrual cycle 05/25/2013  . Previous cesarean delivery affecting pregnancy, antepartum 04/06/2012   She plans to breastfeed She desires patch for postpartum contraception.   Prenatal labs and studies: ABO, Rh: B/Positive/-- (11/07 0000) Antibody: Negative (11/07 0000) Rubella: Immune (11/07 0000) RPR: Nonreactive (11/07 0000)  HBsAg: Negative (11/07 0000)  HIV: Non-reactive (11/07 0000)  GBS: Negative  3 hr Glucola:  Abnormal  Genetic screening normal Anatomy [place]ormal  Prenatal Transfer Tool  Maternal Diabetes: Yes:  Diabetes Type:  Diet controlled Genetic Screening: Normal Maternal Ultrasounds/Referrals: Normal Fetal Ultrasounds or other Referrals:  Referred to Materal Fetal Medicine  Maternal Substance Abuse:  No Significant Maternal Medications:  None Significant Maternal Lab Results: Lab values include: Group B Strep negative  Past Medical History:  Diagnosis Date  . Anemia   . Arthritis   . Asthma    Ventolin  . GERD (gastroesophageal reflux disease)   . Gestational diabetes   . Ulcer     Past Surgical History:  Procedure Laterality Date  .  CESAREAN SECTION    . CESAREAN SECTION  04/06/2012   Procedure: CESAREAN SECTION;  Surgeon: LLahoma Crocker MD;  [facility];  Service: Gynecology;  Laterality: N/A;  . CESAREAN SECTION N/A 06/28/2016   Procedure: CESAREAN SECTION;  Surgeon: CLavonia Drafts MD;  [facility]  Service: Obstetrics;  Laterality: N/A;    OB History  Gravida Para Term Preterm AB Living  '4 3 3     3  ' SAB TAB Ectopic Multiple Live Births        0 3    # Outcome Date GA Lbr Len/2nd Weight Sex Delivery Anes PTL Lv  4 Current           3 Term 06/28/16 [id number] g M CS-LTranv Spinal  LIV  2 Term 04/06/12 [id number] g M CS-LTranv Spinal  LIV  1 Term 02/06/10 39w7w0d CS-Unspec   LIV     Birth Comments: Fetal Distress    Social History   Socioeconomic History  . Marital status: Married    Spouse name: Not on file  . Number of children: 2  . Years of education: Not on file  . Highest education level: Not on file  Occupational History  . Occupation: housewife  Social Needs  . Financial resource strain: Not hard at all  . Food insecurity:    Worry: Never true    Inability: Never true  . Transportation needs:    Medical: No    Non-medical: Not on file  Tobacco Use  . Smoking status: Never Smoker  . Smokeless tobacco: Never Used  Substance and Sexual Activity  . Alcohol use: No  .  Drug use: No  . Sexual activity: Yes    Partners: Male    Birth control/protection: None  Lifestyle  . Physical activity:    Days per week: Not on file    Minutes per session: Not on file  . Stress: Not on file  Relationships  . Social connections:    Talks on phone: Not on file    Gets together: Not on file    Attends religious service: Not on file    Active member of club or organization: Not on file    Attends meetings of clubs or organizations: Not on file    Relationship status: Not on file  Other Topics Concern  . Not on file  Social History Narrative  . Not on file     Family History  Problem Relation Age of Onset  . Diabetes Mother   . Hypertension Mother   . Other Son        Beta Thalassemia trait (minor)  . Colon cancer Neg Hx   . Esophageal cancer Neg Hx   . Rectal cancer Neg Hx   . Stomach cancer Neg Hx     Medications Prior to Admission  Medication Sig Dispense Refill Last Dose  . ACCU-CHEK FASTCLIX LANCETS MISC 1 each by Percutaneous route 4 (four) times daily. 100 each 12 Taking  . albuterol (PROVENTIL HFA;VENTOLIN HFA) 108 (90 Base) MCG/ACT inhaler Inhale 1-2 puffs into the lungs every 6 (six) hours as needed for wheezing or shortness of breath.   Taking  . Blood Glucose Monitoring Suppl (ACCU-CHEK GUIDE) w/Device KIT 1 Device by Does not apply route 4 (four) times daily. 1 kit 0 Taking  . cetirizine (ZYRTEC) 10 MG tablet Take 1 tablet (10 mg total) by mouth daily. (Patient not taking: Reported on 04/23/2019) 30 tablet 5 Not Taking  . Ferrous Sulfate (IRON PO) Take 325 mg by mouth 2 (two) times a day.    Taking  . fluticasone (FLONASE) 50 MCG/ACT nasal spray Place 2 sprays into both nostrils daily. (Patient taking differently: Place 2 sprays into both nostrils daily as needed for allergies. ) 16 g 6 Taking  . glucose blood (ACCU-CHEK GUIDE) test strip Use as instructed 100 each 12 Taking  . montelukast (SINGULAIR) 10 MG tablet Take 10 mg by mouth every evening.    Taking  . Prenatal Vit-Fe Fumarate-FA (PRENATAL VITAMIN PO) Take 1 tablet by mouth daily.    Taking    No Known Allergies  Review of Systems: Negative except for what is mentioned in HPI.  Physical Exam: BP (!) 114/56 (BP Location: Right Arm)   Pulse (!) 103   Temp 98.3 F (36.8 C) (Oral)   Resp 18   LMP 07/26/2018 (Exact Date)  FHT: 130 bpm, moderate variability, +acels, no decels  Toco: q3-5 min   CONSTITUTIONAL: Well-developed, well-nourished female in no acute distress.  HENT:  Normocephalic, atraumatic. Oropharynx is clear and moist EYES: Conjunctivae and EOM are  normal. No scleral icterus.  NECK: Normal range of motion, supple SKIN: Skin is warm and dry. No rash noted. Not diaphoretic. No erythema. Matador: Alert and oriented to person, place, and time. Normal reflexes, muscle tone coordination. No cranial nerve deficit noted. PSYCHIATRIC: Normal mood and affect. Normal behavior. CARDIOVASCULAR: Normal heart rate noted, regular rhythm RESPIRATORY: Effort and breath sounds normal, no problems with respiration noted ABDOMEN: Soft, nontender, nondistended, gravid. Well-healed Pfannenstiel incision. PELVIC: Deferred MUSCULOSKELETAL: Normal range of motion. No edema and no tenderness. 2+ distal pulses.  Pertinent Labs/Studies:   No results found for this or any previous visit (from the past 72 hour(s)).  Assessment and Plan :Teresa [name] is a 38 y.o. [phone] at 69w6dbeing admitted for cesarean section due to SOL with history of prior CS x3. The risks of cesarean section discussed with the patient included but were not limited to: bleeding which may require transfusion or reoperation; infection which may require antibiotics; injury to bowel, bladder, ureters or other surrounding organs; injury to the fetus; need for additional procedures including hysterectomy in the event of a life-threatening hemorrhage; placental abnormalities wth subsequent pregnancies, incisional problems, thromboembolic phenomenon and other postoperative/anesthesia complications. The patient concurred with the proposed plan, giving informed written consent for the procedure. Patient has been NPO since 10 am on 5/5 she will remain NPO for procedure. Anesthesia and OR aware. Preoperative prophylactic antibiotics and SCDs ordered on call to the OR. To OR when ready.    CPhill Myron D.O. OB Fellow  04/24/2019, 4:13 PM

## 2019-04-24 NOTE — MAU Note (Signed)
Started contracting at 0400, getting closer and stronger.  No bleeding or leaking. No recent exams.

## 2019-04-24 NOTE — Anesthesia Procedure Notes (Signed)
Spinal  Patient location during procedure: OB Start time: 04/24/2019 6:14 PM End time: 04/24/2019 6:19 PM Staffing Anesthesiologist: Lynda Rainwater, MD Performed: anesthesiologist  Preanesthetic Checklist Completed: patient identified, surgical consent, pre-op evaluation, timeout performed, IV checked, risks and benefits discussed and monitors and equipment checked Spinal Block Patient position: sitting Prep: site prepped and draped and DuraPrep Patient monitoring: heart rate, cardiac monitor, continuous pulse ox and blood pressure Approach: midline Location: L3-4 Injection technique: single-shot Needle Needle type: Pencan  Needle gauge: 24 G Needle length: 10 cm Assessment Sensory level: T4

## 2019-04-25 ENCOUNTER — Other Ambulatory Visit (HOSPITAL_COMMUNITY)
Admission: RE | Admit: 2019-04-25 | Discharge: 2019-04-25 | Disposition: A | Discharge status: Home / Self Care | Payer: Medicaid Other | Source: Ambulatory Visit | Attending: Student in an Organized Health Care Education/Training Program | Admitting: Student in an Organized Health Care Education/Training Program

## 2019-04-25 LAB — CBC
HCT: 28.7 % — ABNORMAL LOW (ref 36.0–46.0)
Hemoglobin: 9.1 g/dL — ABNORMAL LOW (ref 12.0–15.0)
MCH: 22.1 pg — ABNORMAL LOW (ref 26.0–34.0)
MCHC: 31.7 g/dL (ref 30.0–36.0)
MCV: 69.7 fL — ABNORMAL LOW (ref 80.0–100.0)
Platelets: 120 10*3/uL — ABNORMAL LOW (ref 150–400)
RBC: 4.12 MIL/uL (ref 3.87–5.11)
RDW: 15.7 % — ABNORMAL HIGH (ref 11.5–15.5)
WBC: 8.3 10*3/uL (ref 4.0–10.5)
nRBC: 0 % (ref 0.0–0.2)

## 2019-04-25 LAB — RPR: RPR Ser Ql: NONREACTIVE

## 2019-04-25 LAB — CREATININE, SERUM
Creatinine, Ser: 0.55 mg/dL (ref 0.44–1.00)
GFR calc Af Amer: >60 mL/min (ref >60)
GFR calc non Af Amer: >60 mL/min (ref >60)

## 2019-04-25 LAB — GLUCOSE, CAPILLARY: Glucose-Capillary: 141 mg/dL — ABNORMAL HIGH (ref 70–99)

## 2019-04-25 MED ORDER — HYDROXYZINE HCL 25 MG PO TABS
25.0000 mg | ORAL_TABLET | Freq: Three times a day (TID) | ORAL | Status: DC | PRN
  Administered 2019-04-25: 25 mg via ORAL
  Filled 2019-04-25: qty 1

## 2019-04-25 NOTE — Progress Notes (Signed)
Subjective: Postpartum Day 1: Cesarean Delivery Patient reports incisional pain, tolerating PO and no problems voiding.    Objective: Vital signs in last 24 hours: Temp:  [97.3 F (36.3 C)-98.4 F (36.9 C)] 98.4 F (36.9 C) (05/06 0500) Pulse Rate:  [75-129] 84 (05/06 0500) Resp:  [16-27] 18 (05/06 0500) BP: (100-124)/(51-78) 100/65 (05/06 0500) SpO2:  [98 %-100 %] 99 % (05/06 0500)  Physical Exam:  General: alert, cooperative and no distress Lochia: appropriate Uterine Fundus: firm Incision: no significant drainage DVT Evaluation: No evidence of DVT seen on physical exam. Negative Homan's sign. No cords or calf tenderness. No significant calf/ankle edema.  Recent Labs    04/24/19 1617 04/25/19 0537  HGB 11.2* 9.1*  HCT 35.8* 28.7*    Assessment/Plan: Status post Cesarean section. Doing well postoperatively.  Continue current care.  Truett Mainland 04/25/2019, 7:39 AM

## 2019-04-25 NOTE — Progress Notes (Signed)
Subjective: Postpartum Day 1: Cesarean Delivery Patient reports incisional pain and tolerating PO.  Itching  Objective: Vital signs in last 24 hours: Temp:  [97.3 F (36.3 C)-98.4 F (36.9 C)] 98.4 F (36.9 C) (05/06 0500) Pulse Rate:  [75-129] 84 (05/06 0500) Resp:  [16-27] 18 (05/06 0500) BP: (100-124)/(51-78) 100/65 (05/06 0500) SpO2:  [98 %-100 %] 99 % (05/06 0500)  Physical Exam:  General: alert, cooperative and no distress Lochia: appropriate Uterine Fundus: firm Incision: healing well, no significant drainage, no dehiscence, no significant erythema DVT Evaluation: No evidence of DVT seen on physical exam. Negative Homan's sign. No cords or calf tenderness.  Recent Labs    04/24/19 1617  HGB 11.2*  HCT 35.8*    Assessment/Plan: Status post Cesarean section. Doing well postoperatively.  Continue current care.  Truett Mainland 04/25/2019, 6:40 AM

## 2019-04-25 NOTE — Lactation Note (Signed)
This note was copied from a baby's chart. Lactation Consultation Note  Patient Name: Teresa [name] [id number] Date: 04/25/2019 Reason for consult: Initial assessment;Early term 37-38.6wks P4  Baby is 44 hours old. Mom is experienced breastfeeding her previous babies.  She states she gave babies formula the first two days because they cried a lot and milk was not in yet.  She is asking if formula is needed for newborn.  Discussed colostrum and milk coming to volume.  Recommended mom feed baby with cues and avoid formula if possible.  Breastfeeding consultation services and support information given and reviewed.  Encouraged to call for assist/concerns prn.  Maternal Data Does the patient have breastfeeding experience prior to this delivery?: Yes  Feeding    LATCH Score                   Interventions    Lactation Tools Discussed/Used     Consult Status Consult Status: Follow-up Date: 04/26/19 Follow-up type: In-patient    Ave Filter 04/25/2019, 10:42 AM

## 2019-04-26 ENCOUNTER — Encounter (HOSPITAL_COMMUNITY): Payer: Self-pay | Admitting: Family Medicine

## 2019-04-26 ENCOUNTER — Encounter (HOSPITAL_COMMUNITY)
Admission: RE | Admit: 2019-04-26 | Discharge: 2019-04-26 | Disposition: A | Discharge status: Home / Self Care | Payer: Medicaid Other | Source: Ambulatory Visit | Attending: Student in an Organized Health Care Education/Training Program | Admitting: Student in an Organized Health Care Education/Training Program

## 2019-04-26 MED ORDER — SENNOSIDES-DOCUSATE SODIUM 8.6-50 MG PO TABS: 2.0000 | ORAL_TABLET | Freq: Every evening | ORAL | Status: DC | PRN

## 2019-04-26 MED ORDER — IBUPROFEN 800 MG PO TABS: 800.0000 mg | ORAL_TABLET | Freq: Three times a day (TID) | ORAL | 0 refills | Status: DC

## 2019-04-26 MED ORDER — OXYCODONE HCL 5 MG PO CAPS: 5.0000 mg | ORAL_CAPSULE | Freq: Four times a day (QID) | ORAL | 0 refills | Status: AC | PRN

## 2019-04-26 NOTE — Lactation Note (Signed)
This note was copied from a baby's chart. Lactation Consultation Note  Patient Name: Girl Nycole Kawahara ALPFX'T Date: 04/26/2019 Reason for consult: Follow-up assessment;Early term 78-38.6wks  Visited with P4 Mom of ET infant at 34 hrs old.  Mom had complained of soreness with latching, something she hadn't felt with the other 3 babies.   On oral assessment, no noted labial or lingual tethers noted.  Suck assessment done, and baby has a vigorous suck, using tongue to cup finger well.  Tongue elevates well in mouth, and extends out of mouth.   Baby had just been fed 30 ml of formula.  Offered to assist with positioning and latching as baby was quiet and alert.  Mom prefers using cradle hold.  Both nipples erect and large in diameter.  No nipple trauma visible.  Mom letting baby latch shallow to nipple.  Offered to help with a deeper latch so not to feel pain when baby first latches.  Showed Mom how to elicit a wide mouth and then bring baby onto breast.  Pillow support added to support elbows and Mom relaxing in recliner.  Baby latched wide and deeply, and Mom comfortable.   Encouraged Mom to offer breast before formula.  Mom agreeable.  Encouraged STS as much as possible. Mom to ask for help prn.  Feeding Feeding Type: Breast Fed Nipple Type: Slow - flow  LATCH Score Latch: Grasps breast easily, tongue down, lips flanged, rhythmical sucking.  Audible Swallowing: Spontaneous and intermittent  Type of Nipple: Everted at rest and after stimulation  Comfort (Breast/Nipple): Soft / non-tender  Hold (Positioning): Assistance needed to correctly position infant at breast and maintain latch.  LATCH Score: 9  Interventions Interventions: Breast feeding basics reviewed;Assisted with latch;Skin to skin;Breast massage;Hand express;Support pillows;Adjust position;Position options;Breast compression   Consult Status Consult Status: Follow-up Date: 04/27/19 Follow-up type:  In-patient    Broadus John 04/26/2019, 3:27 PM

## 2019-04-26 NOTE — Progress Notes (Signed)
Mom and infant's discharge info reviewed with pt and FOB. ID bands matched number [id number]. Hugs tag removed #[id number]. Mom leaving unit via wheelchair pushed by NT. Baby strapped in car seat by parents to leave unit. Patients escorted from unit by NT.

## 2019-05-03 ENCOUNTER — Other Ambulatory Visit: Payer: Self-pay

## 2019-05-03 ENCOUNTER — Telehealth: Payer: Self-pay | Admitting: Obstetrics and Gynecology

## 2019-05-03 ENCOUNTER — Ambulatory Visit (INDEPENDENT_AMBULATORY_CARE_PROVIDER_SITE_OTHER): Payer: Medicaid Other | Admitting: General Practice

## 2019-05-03 VITALS — BP 122/67 | HR 70 | Ht 65.0 in | Wt 147.0 lb

## 2019-05-03 DIAGNOSIS — Z5189 Encounter for other specified aftercare: Secondary | ICD-10-CM

## 2019-05-03 NOTE — Telephone Encounter (Signed)
Called pt back at 2 PM: Pt reports she took her bandage off yesterday. She reports she has some white drainage from the incision and reports it smelled really bad. Shjhe reports this morning the incision is noted to be draining green drainage and has continued to drain.   Patient agreeable to come in at 3:20 pm for a nurse visit to have her incision checked. Pt reports she is able to come at that time.

## 2019-05-03 NOTE — Progress Notes (Signed)
Patient presents to office today with concern of infection in her c-section incision. Per review, patient had repeat c/s 5/5. Patient states she recently removed her bandage from surgery and noticed green discharge and an odor. Steri strips were saturated with drainage and were removed. Incision appears to be healing, not completely approximated yet- no odor present. Incision does not show evidence of infection at this time. Steri Strips replaced. Wound care and signs & symptoms of infection reviewed with patient. Dr Elly Modena was in to assess wound as well. Patient had no questions.  Koren Bound RN BSN 05/03/19

## 2019-05-03 NOTE — Telephone Encounter (Signed)
The patient stated her incision is leaking a white and green fluid and it smells bad. She feels it is infected and would like to speak with a nurse. [phone] call back number, she stated this is her husband's phone as her's is not working.

## 2019-05-04 NOTE — Progress Notes (Signed)
I have reviewed the chart and agree with nursing staff's documentation of this patient's encounter.  Mora Bellman, MD 05/04/2019 11:48 AM

## 2019-05-07 ENCOUNTER — Ambulatory Visit: Payer: Medicaid Other

## 2019-07-25 ENCOUNTER — Encounter (HOSPITAL_COMMUNITY): Payer: Self-pay

## 2020-03-09 IMAGING — US US MFM FETAL BPP WO NON STRESS
1 series · 13 of 26 positions shown · non-contrast
Comparison: none

[Series 1: us mfm fetal bpp wo non stress · 26 acquisitions, 13 frames shown]
[im 2/26]
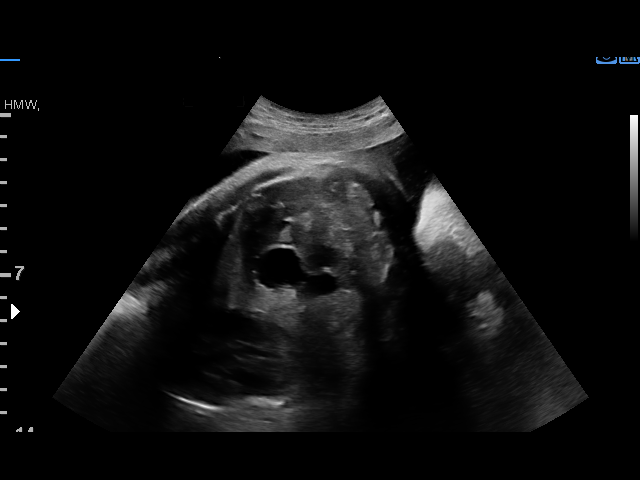
[im 4/26]
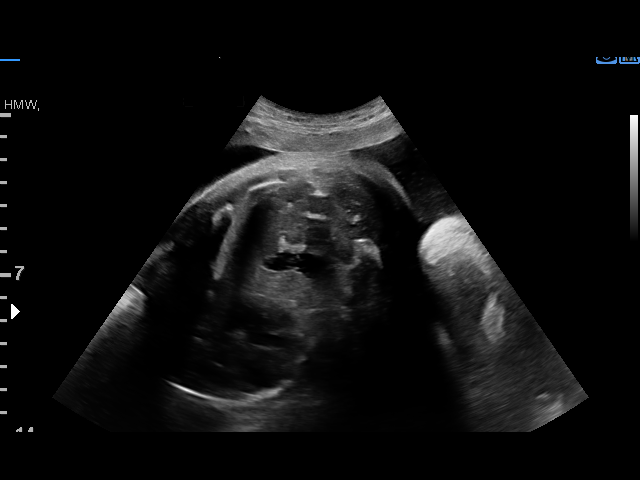
[im 6/26]
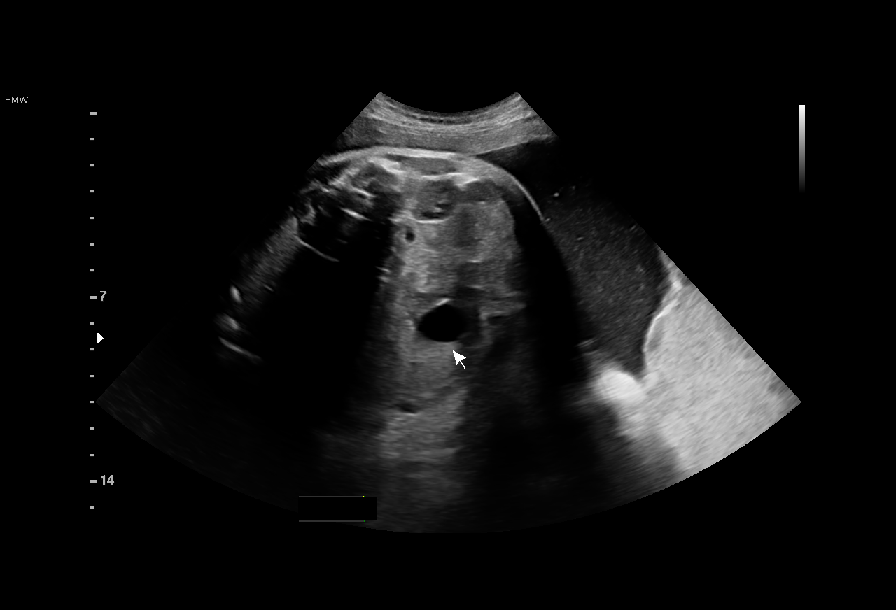
[im 8/26]
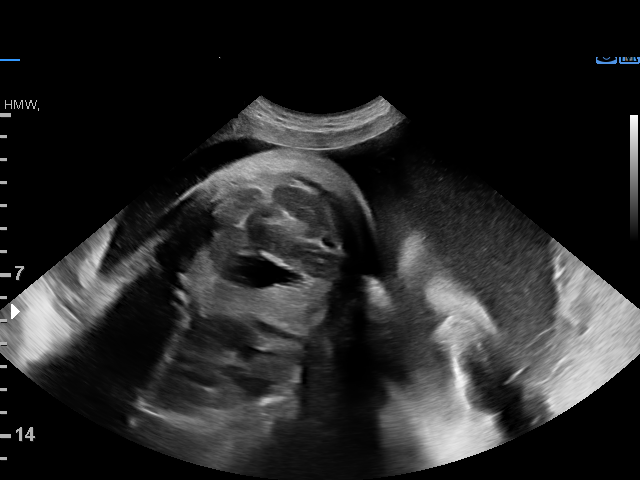
[im 10/26]
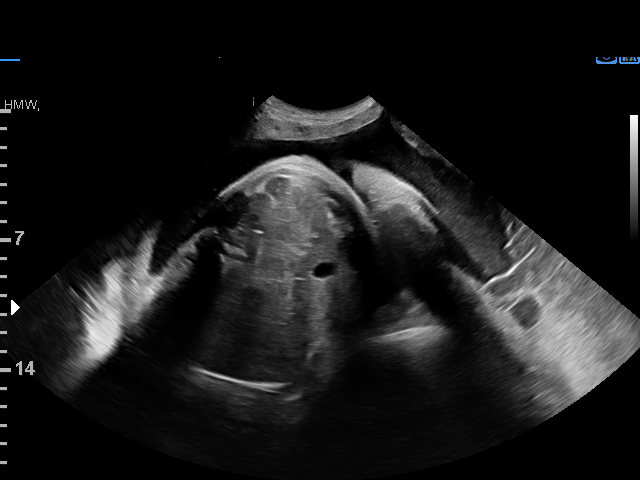
[im 12/26]
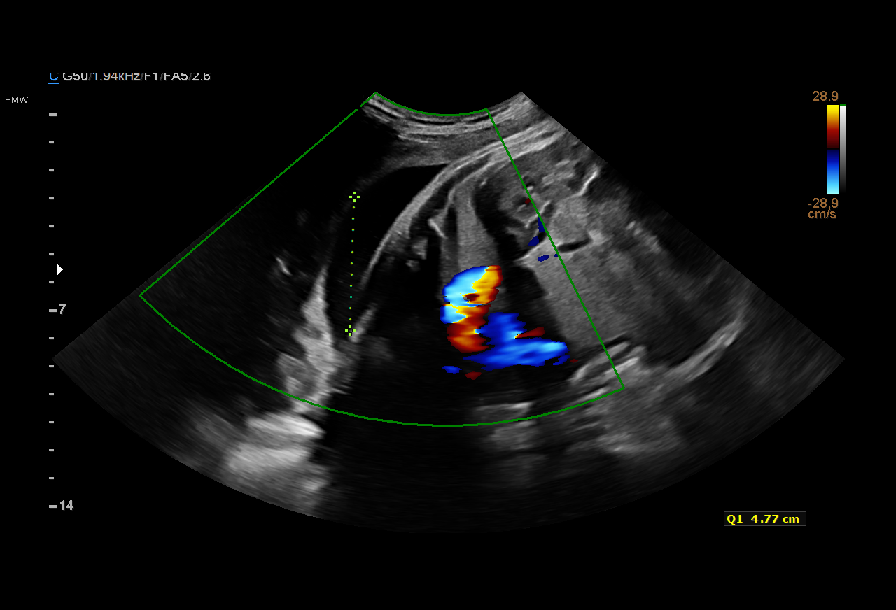
[im 14/26]
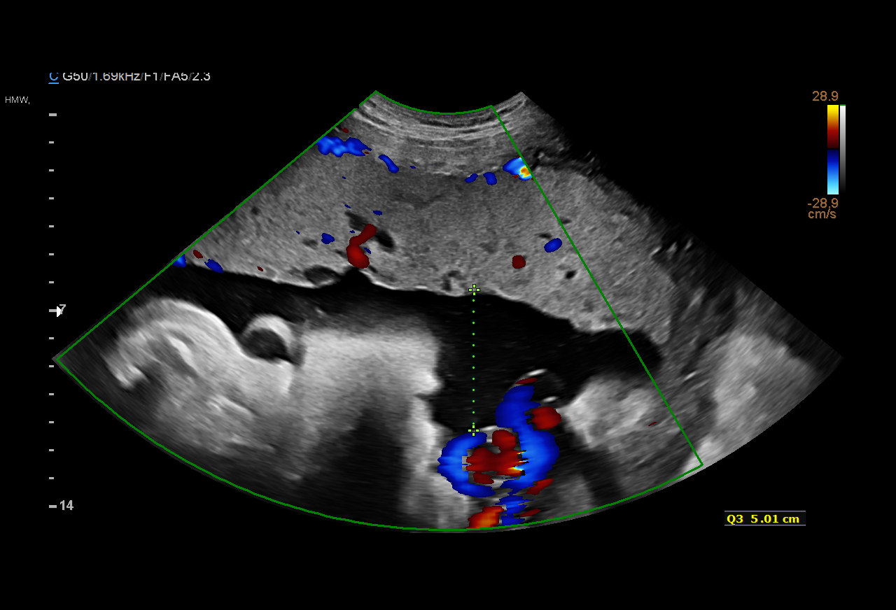
[im 16/26]
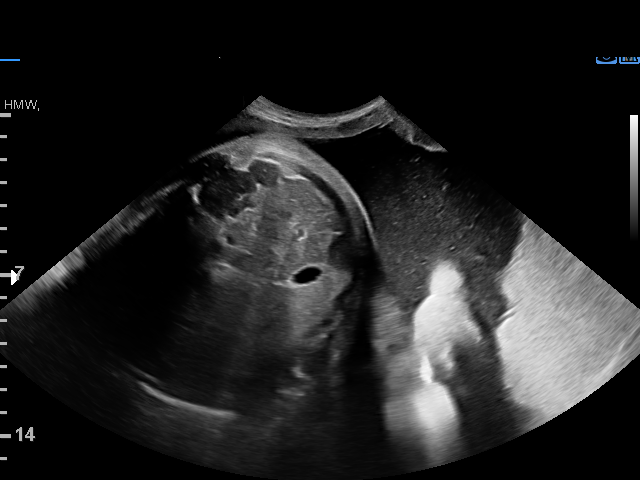
[im 18/26]
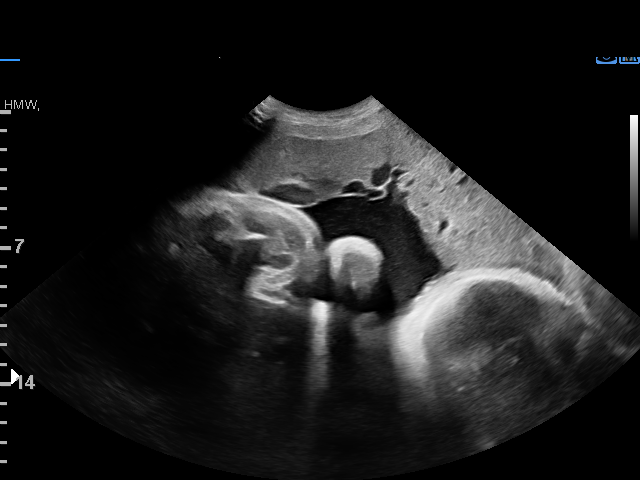
[im 20/26]
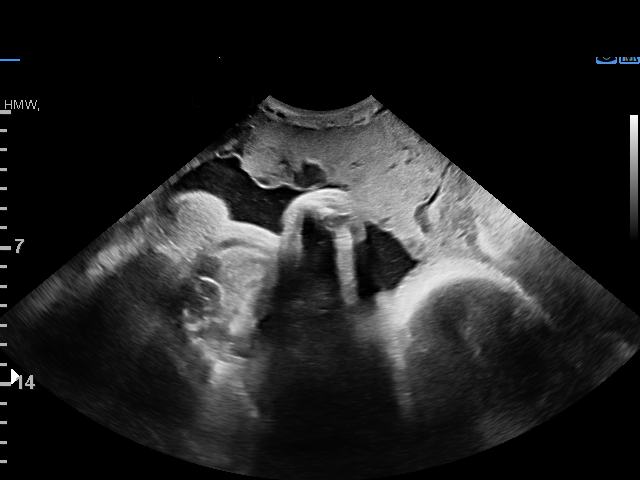
[im 22/26]
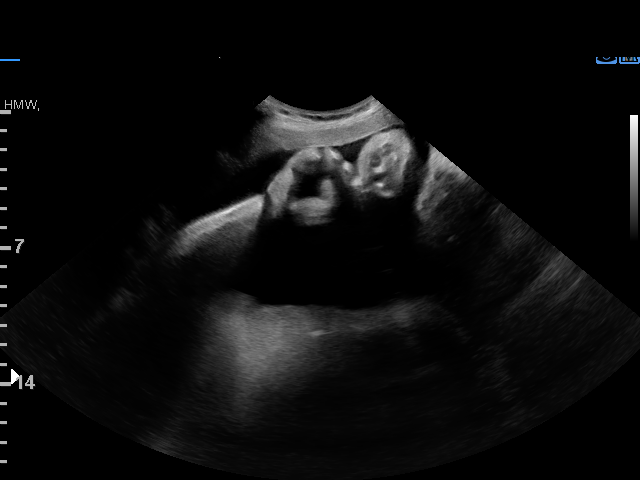
[im 24/26]
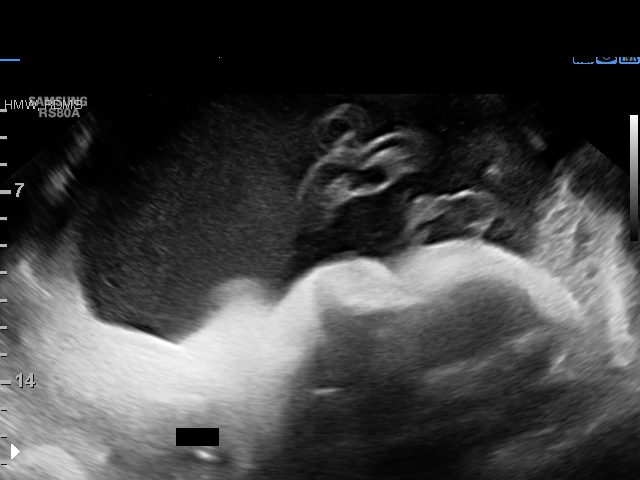
[im 26/26]
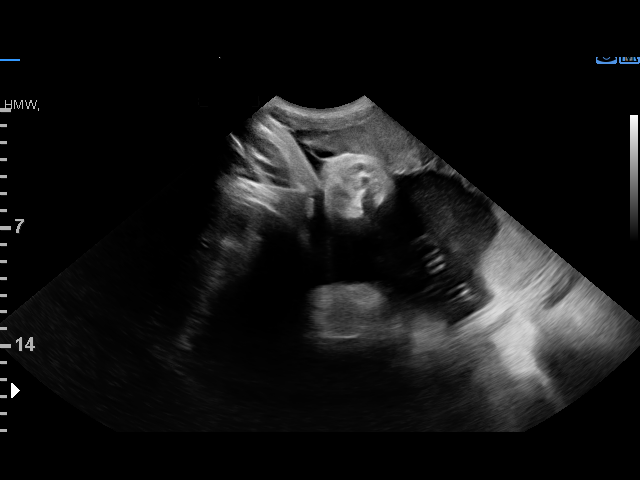

[13 of 26 positions shown; findings below may reference images not displayed]

STRESS                                            ANELY
 ----------------------------------------------------------------------

 ----------------------------------------------------------------------
Indications

  37 weeks gestation of pregnancy
  Polyhydramnios, third trimester, antepartum
  condition or complication, unspecified fetus
  Gestational diabetes in pregnancy, diet
  controlled
  History of cesarean delivery, currently
  pregnant X 3
  History of fetal abnormality in previous
  pregnancy, currently pregnant (son with
  Thalesemia)
  Advanced maternal age multigravida 35+,
  second trimester (low risk NIPs)
  Poor obstetric history: Previous fetal growth
  restriction (FGR)
 ----------------------------------------------------------------------
Vital Signs

                                                Height:        5'5"
Fetal Evaluation

 Num Of Fetuses:         1
 Fetal Heart Rate(bpm):  122
 Cardiac Activity:       Observed
 Presentation:           Cephalic

 Amniotic Fluid
 AFI FV:      Within normal limits

 AFI Sum(cm)     %Tile       Largest Pocket(cm)
 20.96           82

 RUQ(cm)       RLQ(cm)       LUQ(cm)        LLQ(cm)

Biophysical Evaluation

 Amniotic F.V:   Within normal limits       F. Tone:        Observed
 F. Movement:    Observed                   Score:          [DATE]
 F. Breathing:   Observed
OB History

 Gravidity:    4         Term:   3
 Living:       3
Gestational Age

 LMP:           37w 6d        Date:  07/26/18                 EDD:   05/02/19
 Best:          37w 6d     Det. By:  LMP  (07/26/18)          EDD:   05/02/19
Anatomy

 Thoracic:              Appears normal         Bladder:                Appears normal
 Stomach:               Appears normal, left
                        sided
Cervix Uterus Adnexa

 Cervix
 Not visualized (advanced GA >88wks)
Impression

 Biophysical profile [DATE]
Recommendations

 Repeat BPP in 1 week.

## 2020-07-22 ENCOUNTER — Ambulatory Visit: Payer: Medicaid Other | Admitting: Family Medicine

## 2020-08-01 ENCOUNTER — Encounter (HOSPITAL_COMMUNITY): Payer: Self-pay

## 2020-08-01 ENCOUNTER — Other Ambulatory Visit: Payer: Self-pay

## 2020-08-01 ENCOUNTER — Ambulatory Visit (HOSPITAL_COMMUNITY)
Admission: EM | Admit: 2020-08-01 | Discharge: 2020-08-01 | Disposition: A | Discharge status: Home / Self Care | Payer: Medicaid Other | Attending: Physician Assistant | Admitting: Physician Assistant

## 2020-08-01 DIAGNOSIS — J069 Acute upper respiratory infection, unspecified: Secondary | ICD-10-CM | POA: Insufficient documentation

## 2020-08-01 DIAGNOSIS — M1990 Unspecified osteoarthritis, unspecified site: Secondary | ICD-10-CM | POA: Insufficient documentation

## 2020-08-01 DIAGNOSIS — J45909 Unspecified asthma, uncomplicated: Secondary | ICD-10-CM | POA: Diagnosis not present

## 2020-08-01 DIAGNOSIS — Z79899 Other long term (current) drug therapy: Secondary | ICD-10-CM | POA: Diagnosis not present

## 2020-08-01 DIAGNOSIS — Z20822 Contact with and (suspected) exposure to covid-19: Secondary | ICD-10-CM | POA: Diagnosis not present

## 2020-08-01 DIAGNOSIS — K219 Gastro-esophageal reflux disease without esophagitis: Secondary | ICD-10-CM | POA: Insufficient documentation

## 2020-08-01 DIAGNOSIS — J029 Acute pharyngitis, unspecified: Secondary | ICD-10-CM

## 2020-08-01 LAB — POCT RAPID STREP A, ED / UC: Streptococcus, Group A Screen (Direct): NEGATIVE

## 2020-08-01 MED ORDER — CEPACOL SORE THROAT 5.4 MG MT LOZG: 1.0000 | LOZENGE | OROMUCOSAL | 0 refills | Status: DC | PRN

## 2020-08-01 MED ORDER — ACETAMINOPHEN 500 MG PO TABS: 1000.0000 mg | ORAL_TABLET | Freq: Three times a day (TID) | ORAL | 0 refills | Status: DC | PRN

## 2020-08-01 NOTE — ED Triage Notes (Signed)
Pt c/o fever, sore throat, dysphagia, body aches and chills started today.

## 2020-08-01 NOTE — ED Provider Notes (Signed)
[place]    CSN: [id number] Arrival date & time: 08/01/20  [place]      History   Chief Complaint Chief Complaint  Patient presents with  . Sore Throat    HPI Teresa [name] is a 39 y.o. female.   Patient presents for evaluation of sore throat and chills.  She reports symptoms started suddenly today.  She reports waking up with chills and developing a sore throat.  She reports it hurts to swallow.  No difficulty swallowing or throat swelling.  Denies cough.  No reported fever.  She also endorses a headache.  Slight runny nose.  Denies nausea, vomiting or diarrhea.  She reports her child very similar symptoms that started yesterday.  She also reports a group of people that she was around with identical symptoms as well.  She has taken over-the-counter medicines for pain.     Past Medical History:  Diagnosis Date  . Anemia   . Arthritis   . Asthma    Ventolin  . GERD (gastroesophageal reflux disease)   . Gestational diabetes   . Ulcer     Patient Active Problem List   Diagnosis Date Noted  . Status post repeat low transverse cesarean section 04/24/2019  . History of polyhydramnios 04/23/2019  . Supervision of high risk pregnancy, antepartum 03/02/2019  . Gestational diabetes 03/02/2019  . Muscle spasm 02/23/2018  . Chronic tension headache 02/23/2018  . Palpitations 03/17/2017  . History of prediabetes 03/17/2017  . Family history of beta thalassemia 01/15/2016  . Irregular menstrual cycle 05/25/2013  . Previous cesarean delivery affecting pregnancy, antepartum 04/06/2012    Past Surgical History:  Procedure Laterality Date  . CESAREAN SECTION    . CESAREAN SECTION  04/06/2012   Procedure: CESAREAN SECTION;  Surgeon: Lahoma Crocker, MD;  Location: [facility];  Service: Gynecology;  Laterality: N/A;  . CESAREAN SECTION N/A 06/28/2016   Procedure: CESAREAN SECTION;  Surgeon: Lavonia Drafts, MD;  Location: [place];  Service:  Obstetrics;  Laterality: N/A;  . CESAREAN SECTION N/A 04/24/2019   Procedure: REPEAT CESAREAN SECTION;  Surgeon: Truett Mainland, DO;  Location: [facility];  Service: Obstetrics;  Laterality: N/A;  . SCAR REVISION N/A 04/24/2019   Procedure: SCAR REVISION;  Surgeon: Truett Mainland, DO;  Location: [facility];  Service: Obstetrics;  Laterality: N/A;    OB History    Gravida  4   Para  3   Term  3   Preterm      AB      Living  3     SAB      TAB      Ectopic      Multiple  0   Live Births  3            Home Medications    Prior to Admission medications   Medication Sig Start Date End Date Taking? Authorizing Provider  albuterol (PROVENTIL HFA;VENTOLIN HFA) 108 (90 Base) MCG/ACT inhaler Inhale 1-2 puffs into the lungs every 6 (six) hours as needed for wheezing or shortness of breath.    [provider]  Ferrous Sulfate (IRON PO) Take 325 mg by mouth 2 (two) times a day.     [provider]  fluticasone (FLONASE) 50 MCG/ACT nasal spray Place 2 sprays into both nostrils daily. Patient taking differently: Place 2 sprays into both nostrils daily as needed for allergies.  02/06/18   Tonette Bihari, MD  ibuprofen (ADVIL) 800  MG tablet Take 1 tablet (800 mg total) by mouth 3 (three) times daily. 04/26/19   Glenice Bow, DO  Menthol (CEPACOL SORE THROAT) 5.4 MG LOZG Use as directed 1 lozenge (5.4 mg total) in the mouth or throat every 2 (two) hours as needed. 08/01/20   Aiman Noe, Marguerita Beards, PA-C  montelukast (SINGULAIR) 10 MG tablet Take 10 mg by mouth every evening.     [provider]  Prenatal Vit-Fe Fumarate-FA (PRENATAL VITAMIN PO) Take 1 tablet by mouth daily.     [provider]  senna-docusate (SENOKOT-S) 8.6-50 MG tablet Take 2 tablets by mouth at bedtime as needed for mild constipation. 04/26/19   Glenice Bow, DO    Family History Family History  Problem Relation Age of Onset  . Diabetes Mother   . Hypertension Mother   .  Other Son        Beta Thalassemia trait (minor)  . Colon cancer Neg Hx   . Esophageal cancer Neg Hx   . Rectal cancer Neg Hx   . Stomach cancer Neg Hx     Social History Social History   Tobacco Use  . Smoking status: Never Smoker  . Smokeless tobacco: Never Used  Vaping Use  . Vaping Use: Never used  Substance Use Topics  . Alcohol use: No  . Drug use: No     Allergies   Patient has no known allergies.   Review of Systems Review of Systems   Physical Exam Triage Vital Signs ED Triage Vitals  Enc Vitals Group     BP 08/01/20 1943 (!) 116/58     Pulse Rate 08/01/20 1943 84     Resp 08/01/20 1943 16     Temp 08/01/20 1943 98.5 F (36.9 C)     Temp Source 08/01/20 1943 Oral     SpO2 08/01/20 1943 100 %     Weight 08/01/20 1944 148 lb (67.1 kg)     Height 08/01/20 1944 5\' 5"  (1.651 m)     Head Circumference --      Peak Flow --      Pain Score 08/01/20 1944 7     Pain Loc --      Pain Edu? --      Excl. in [place]? --    No data found.  Updated Vital Signs BP (!) 116/58   Pulse 84   Temp 98.5 F (36.9 C) (Oral)   Resp 16   Ht 5\' 5"  (1.651 m)   Wt 148 lb (67.1 kg)   SpO2 100%   BMI 24.63 kg/m   Visual Acuity Right Eye Distance:   Left Eye Distance:   Bilateral Distance:    Right Eye Near:   Left Eye Near:    Bilateral Near:     Physical Exam Vitals and nursing note reviewed.  Constitutional:      General: She is not in acute distress.    Appearance: She is well-developed.  HENT:     Head: Normocephalic and atraumatic.     Nose: Congestion and rhinorrhea present.     Mouth/Throat:     Mouth: Mucous membranes are moist.     Pharynx: Uvula midline. No pharyngeal swelling, posterior oropharyngeal erythema or uvula swelling.  Eyes:     Conjunctiva/sclera: Conjunctivae normal.  Neck:     Comments: Shotty cervical adenopathy Cardiovascular:     Rate and Rhythm: Normal rate and regular rhythm.     Heart sounds: No murmur heard.  Pulmonary:      Effort: Pulmonary effort is normal. No respiratory distress.     Breath sounds: Normal breath sounds.  Abdominal:     Palpations: Abdomen is soft.     Tenderness: There is no abdominal tenderness.  Musculoskeletal:     Cervical back: Neck supple.  Skin:    General: Skin is warm and dry.  Neurological:     Mental Status: She is alert.      UC Treatments / Results  Labs (all labs ordered are listed, but only abnormal results are displayed) Labs Reviewed  SARS CORONAVIRUS 2 (TAT 6-24 HRS)  CULTURE, GROUP A STREP [facility])  POCT RAPID STREP A, ED / UC    EKG   Radiology No results found.  Procedures Procedures (including critical care time)  Medications Ordered in UC Medications - No data to display  Initial Impression / Assessment and Plan / UC Course  I have reviewed the triage vital signs and the nursing notes.  Pertinent labs & imaging results that were available during my care of the patient were reviewed by me and considered in my medical decision making (see chart for details).     #Viral URI Patient is 39 year old presenting with a viral URI.  Rapid strep was negative.  Culture sent.  Given sudden onset and individuals with nearly identical symptoms with onset in a similar timeframe, most likely viral.  Covid sent.  We will treat symptomatically.  Discussed use of regular strength Tylenol for pain relief.  Discussed use of Cepacol lozenges.  Discussed return and follow-up precautions.  Patient verbalized understanding plan of care. Final Clinical Impressions(s) / UC Diagnoses   Final diagnoses:  Viral upper respiratory tract infection     Discharge Instructions     Strep test was negative, we sent a culture and will contact we need to change treatment  Tylenol for sore throat body ache Use Cepacol lozenge every 2 hours for sore throat  I believe this is a virus and should improve over short period time.  However all tests are negative and you are not  improving at all please return  If your Covid-19 test is positive, you will receive a phone call from [facility] regarding your results. Negative test results are not called. Both positive and negative results area always visible on MyChart. If you do not have a MyChart account, sign up instructions are in your discharge papers.   Persons who are directed to care for themselves at home may discontinue isolation under the following conditions:  . At least 10 days have passed since symptom onset and . At least 24 hours have passed without running a fever (this means without the use of fever-reducing medications) and . Other symptoms have improved.  Persons infected with COVID-19 who never develop symptoms may discontinue isolation and other precautions 10 days after the date of their first positive COVID-19 test.       ED Prescriptions    Medication Sig Dispense Auth. Provider   Menthol (CEPACOL SORE THROAT) 5.4 MG LOZG Use as directed 1 lozenge (5.4 mg total) in the mouth or throat every 2 (two) hours as needed. 30 lozenge Roshun Klingensmith, Marguerita Beards, PA-C   acetaminophen (TYLENOL) 500 MG tablet  (Status: Discontinued) Take 2 tablets (1,000 mg total) by mouth every 8 (eight) hours as needed. 30 tablet Clorene Nerio, Marguerita Beards, PA-C     PDMP not reviewed this encounter.   Purnell Shoemaker, PA-C 08/01/20 2118

## 2020-08-01 NOTE — Discharge Instructions (Signed)
Strep test was negative, we sent a culture and will contact we need to change treatment  Tylenol for sore throat body ache Use Cepacol lozenge every 2 hours for sore throat  I believe this is a virus and should improve over short period time.  However all tests are negative and you are not improving at all please return  If your Covid-19 test is positive, you will receive a phone call from [facility] regarding your results. Negative test results are not called. Both positive and negative results area always visible on MyChart. If you do not have a MyChart account, sign up instructions are in your discharge papers.   Persons who are directed to care for themselves at home may discontinue isolation under the following conditions:   At least 10 days have passed since symptom onset and  At least 24 hours have passed without running a fever (this means without the use of fever-reducing medications) and  Other symptoms have improved.  Persons infected with COVID-19 who never develop symptoms may discontinue isolation and other precautions 10 days after the date of their first positive COVID-19 test.

## 2020-08-03 LAB — SARS CORONAVIRUS 2 (TAT 6-24 HRS): SARS Coronavirus 2: NEGATIVE

## 2020-08-05 ENCOUNTER — Telehealth (HOSPITAL_COMMUNITY): Payer: Self-pay | Admitting: Emergency Medicine

## 2020-08-05 LAB — CULTURE, GROUP A STREP (THRC)

## 2020-08-05 MED ORDER — AMOXICILLIN 500 MG PO CAPS: 500.0000 mg | ORAL_CAPSULE | Freq: Two times a day (BID) | ORAL | 0 refills | Status: AC

## 2020-09-08 ENCOUNTER — Ambulatory Visit (INDEPENDENT_AMBULATORY_CARE_PROVIDER_SITE_OTHER): Payer: Medicaid Other | Admitting: Family Medicine

## 2020-09-08 ENCOUNTER — Other Ambulatory Visit: Payer: Self-pay

## 2020-09-08 VITALS — BP 108/64 | HR 83 | Ht 65.0 in | Wt 150.0 lb

## 2020-09-08 DIAGNOSIS — Z0000 Encounter for general adult medical examination without abnormal findings: Secondary | ICD-10-CM | POA: Diagnosis not present

## 2020-09-08 DIAGNOSIS — R7309 Other abnormal glucose: Secondary | ICD-10-CM

## 2020-09-08 DIAGNOSIS — Z87898 Personal history of other specified conditions: Secondary | ICD-10-CM

## 2020-09-08 DIAGNOSIS — D509 Iron deficiency anemia, unspecified: Secondary | ICD-10-CM | POA: Diagnosis not present

## 2020-09-08 LAB — POCT HEMOGLOBIN: Hemoglobin: 11.5 g/dL (ref 11–14.6)

## 2020-09-08 LAB — POCT GLYCOSYLATED HEMOGLOBIN (HGB A1C): HbA1c, POC (controlled diabetic range): 7.7 % — AB (ref 0.0–7.0)

## 2020-09-08 NOTE — Patient Instructions (Signed)
Thank you for coming to see me today. It was a pleasure.   Please follow-up with PCP in 1 week  If you have any questions or concerns, please do not hesitate to call the office at [phone].  Best,   Carollee Leitz, MD [facility]

## 2020-09-08 NOTE — Progress Notes (Signed)
    SUBJECTIVE:   CHIEF COMPLAINT / HPI: for physical  No acute concerns today. Denies any chest pain, SOB, fatigue, dizziness, nausea or vomiting.  Has history of gestation diabetes and iron deficiency.    PERTINENT  PMH / PSH:  Gestation Diabetes  OBJECTIVE:   BP 108/64   Pulse 83   Ht 5\' 5"  (1.651 m)   Wt 150 lb (68 kg)   SpO2 98%   BMI 24.96 kg/m   General: Alert and oriented, no apparent distress  Eyes: PEERLA ENTM: No pharyengeal erythema Neck: nontender Cardiovascular: RRR with no murmurs noted Respiratory: CTA bilaterally  Gastrointestinal: Bowel sounds present. No abdominal pain MSK: Upper extremity strength 5/5 bilaterally, Lower extremity strength 5/5 bilaterally  Derm: No rashes noted Psych: Behavior and speech appropriate to situation  ASSESSMENT/PLAN:   Physical exam Normal exam.  Last PAP 2019 -Hbg 11.5 today, increased from 9.1 -Follow up with PCP as needed  Elevated glucose HbA1c 7.7.  History of gestational diabetes without medication use.  Denies any polyuria, polydipsia, weakness or fatigue -Patient would like to diet and exercise before starting medication. -Recommend repeat HbA1c 3/12, if remains elevated consider Metformin XR 500 mg daily.   -Follow up with PCP 3/12     Carollee Leitz, MD [place]

## 2020-09-11 ENCOUNTER — Encounter: Payer: Self-pay | Admitting: Family Medicine

## 2020-09-11 DIAGNOSIS — R7309 Other abnormal glucose: Secondary | ICD-10-CM | POA: Insufficient documentation

## 2020-09-11 DIAGNOSIS — Z0000 Encounter for general adult medical examination without abnormal findings: Secondary | ICD-10-CM

## 2020-09-11 HISTORY — DX: Encounter for general adult medical examination without abnormal findings: Z00.00

## 2020-09-11 NOTE — Assessment & Plan Note (Addendum)
Normal exam.  Last PAP 2019 -Hbg 11.5 today, increased from 9.1 -Follow up with PCP as needed

## 2020-09-11 NOTE — Assessment & Plan Note (Signed)
HbA1c 7.7.  History of gestational diabetes without medication use.  Denies any polyuria, polydipsia, weakness or fatigue -Patient would like to diet and exercise before starting medication. -Recommend repeat HbA1c 3/12, if remains elevated consider Metformin XR 500 mg daily.   -Follow up with PCP 3/12

## 2020-09-15 ENCOUNTER — Other Ambulatory Visit: Payer: Self-pay

## 2020-09-15 ENCOUNTER — Encounter: Payer: Self-pay | Admitting: Family Medicine

## 2020-09-15 ENCOUNTER — Ambulatory Visit (INDEPENDENT_AMBULATORY_CARE_PROVIDER_SITE_OTHER): Payer: Medicaid Other | Admitting: Family Medicine

## 2020-09-15 VITALS — BP 98/62 | HR 74 | Ht 65.0 in | Wt 151.4 lb

## 2020-09-15 DIAGNOSIS — N941 Unspecified dyspareunia: Secondary | ICD-10-CM | POA: Diagnosis not present

## 2020-09-15 NOTE — Patient Instructions (Addendum)
I am sending you a referral to OBGYN to work up your pain with sexual intercourse. In the meantime, you should use lubrication with sexual intercourse. Stop using the colpotrohpine.   Dyspareunia, Female Dyspareunia is pain that is associated with sexual activity. This can affect any part of the genitals or lower abdomen. There are many possible causes of this condition. In some cases, diagnosing the cause of dyspareunia can be difficult. This condition can be mild, moderate, or severe. Depending on the cause, dyspareunia may get better with treatment, but may return (recur) over time. What are the causes?  The cause of this condition is not always known. However, problems that affect the vulva, vagina, uterus, and other organs may cause dyspareunia. Common causes of this condition include:  Vaginal dryness.  Giving birth.  Infection.  Skin changes or conditions.  Side effects of medicines.  Endometriosis. This is when tissue that is like the lining of the uterus grows on the outside of the uterus.  Psychological conditions. These include depression, anxiety, or traumatic experiences.  Allergic reaction. What increases the risk? The following factors may make you more likely to develop this condition:  History of physical or sexual trauma.  Some medicines.  No longer having a monthly period (menopause).  Having recently given birth.  Taking baths using soaps that have perfumes. These can cause irritation.  Douching. What are the signs or symptoms? The main symptom of this condition is pain in any part of your genitals or lower abdomen during or after sex. This may include:  Irritation, burning, or stinging sensations in your vulva.  Discomfort when your vulva or surrounding area is touched.  Aching and throbbing pain that may be constant.  Pain that gets worse when something is inserted into your vagina. How is this diagnosed? This condition may be diagnosed based  on:  Your symptoms, including where and when your pain occurs.  Your medical history.  A physical exam. A pelvic exam will most likely be done.  Tests that include ultrasound, blood tests, and tests that check the body for infection.  Imaging tests, such as X-ray, MRI, and CT scan. You may be referred to a health care provider who specializes in women's health (gynecologist). How is this treated? Treatment depends on the cause of your condition and your symptoms. In most cases, you may need to stop sexual activity until your symptoms go away or get better. Treatment may include:  Lubricants, ointments, and creams.  Physical therapy.  Massage therapy.  Hormonal therapy.  Medicines to: ? Prevent or fight infection. ? Relieve pain. ? Help numb the area. ? Treat depression (antidepressants).  Counseling, which may include sex therapy.  Surgery. Follow these instructions at home: Lifestyle  Wear cotton underwear.  Use water-based lubricants as needed during sex. Avoid oil-based lubricants.  Do not use any products that can cause irritation. This may include certain condoms, spermicides, lubricants, soaps, tampons, vaginal sprays, or douches.  Always practice safe sex. Use a condom to prevent sexually transmitted infections (STIs).  Talk freely with your partner about your condition. General instructions  Take or apply over-the-counter and prescription medicines only as told by your health care provider.  Urinate before you have sex.  Consider joining a support group.  Get the results of any tests you have done. Ask your health care provider, or the department that is doing the procedure, when your results will be ready.  Keep all follow-up visits as told by your health care  provider. This is important. Contact a health care provider if:  You have vaginal bleeding after having sex.  You develop a lump at the opening of your vagina even if the lump is  painless.  You have: ? Abnormal discharge from your vagina. ? Vaginal dryness. ? Itchiness or irritation of your vulva or vagina. ? A new rash. ? Symptoms that get worse or do not improve with treatment. ? A fever. ? Pain when you urinate. ? Blood in your urine. Get help right away if:  You have severe pain in your abdomen during or shortly after sex.  You pass out after sex. Summary  Dyspareunia is pain that is associated with sexual activity. This can affect any part of the genitals or lower abdomen.  There are many causes of this condition. Treatment depends on the cause and your symptoms. In most cases, you may need to stop sexual activity until your symptoms improve.  Take or apply over-the-counter and prescription medicines only as told by your health care provider.  Contact a health care provider if your symptoms get worse or do not improve with treatment.  Keep all follow-up visits as told by your health care provider. This is important. This information is not intended to replace advice given to you by your health care provider. Make sure you discuss any questions you have with your health care provider. Document Revised: 02/12/2019 Document Reviewed: 02/12/2019 Elsevier Patient Education  [place].

## 2020-09-15 NOTE — Assessment & Plan Note (Addendum)
Most likely due to vaginal dryness. No obvious rashes or abnormal skin/mucosal findings on exam.  - obtained wet prep to rule out infections  - no obvious rashes, but decreased ruggae and shallow vaginal vault.  - recommend trying different types of lubricants (water vs silicone, etc) for sexual encounters  - discontinue colpotrophine  - referral to [facility]

## 2020-09-15 NOTE — Progress Notes (Signed)
    SUBJECTIVE:   CHIEF COMPLAINT / HPI:   Dyspareunia  Has been going on for 16 years. She reports pain upon early penetration and describes the pain as a burning feeling. Burning feeling is only felt with penetration of penis and not other objects. Denies pain with deep penetration. She has tried a lubricant in the past with no improvement, but does report possibly having an allergic reaction to it. Has had 4 cesarean sections (no vaginal deliveries). 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 She reports that this helps with pain. No history of sexual trauma or abuse.  Has not had menstrual cycle since before last pregnancy (May 2020). Currently breast feeding.   PERTINENT  PMH / PSH: G4P4 s/p 4 cesarean   OBJECTIVE:   BP 98/62   Pulse 74   Ht 5\' 5"  (1.651 m)   Wt 151 lb 6.4 oz (68.7 kg)   SpO2 97%   BMI 25.19 kg/m   GU: External vulva and vagina nonerythematous, without any obvious lesions or rash.  No abnormal discharge appreciated. Decreased ruggae of vaginal walls.  Shallow vaginal vault. Cervix lies on patients left superolaterally in vaginal vault about 4 cm from vaginal opening. There is palpaple soft bulging tissue appreciated distal to cervix. Cervix is non erythematous and non-friable.  There is no cervical motion tenderness. Pain is not reproduced with insertion of speculum or with bimanual exam.    ASSESSMENT/PLAN:   Dyspareunia, female Most likely due to vaginal dryness. No obvious rashes or abnormal skin/mucosal findings on exam.  - obtained wet prep to rule out infections  - no obvious rashes, but decreased ruggae and shallow vaginal vault.  - recommend trying different types of lubricants (water vs silicone, etc) for sexual encounters  - discontinue colpotrophine  - referral to [place], MD [place]

## 2020-09-16 LAB — POCT WET PREP (WET MOUNT)
Clue Cells Wet Prep Whiff POC: NEGATIVE
Trichomonas Wet Prep HPF POC: ABSENT

## 2020-09-16 MED ORDER — FLUCONAZOLE 150 MG PO TABS: 150.0000 mg | ORAL_TABLET | Freq: Once | ORAL | 0 refills | Status: AC

## 2020-10-29 ENCOUNTER — Other Ambulatory Visit: Payer: Self-pay

## 2020-10-29 ENCOUNTER — Ambulatory Visit (INDEPENDENT_AMBULATORY_CARE_PROVIDER_SITE_OTHER): Payer: Medicaid Other | Admitting: Family Medicine

## 2020-10-29 ENCOUNTER — Encounter: Payer: Self-pay | Admitting: Family Medicine

## 2020-10-29 DIAGNOSIS — E119 Type 2 diabetes mellitus without complications: Secondary | ICD-10-CM | POA: Diagnosis not present

## 2020-10-29 MED ORDER — METFORMIN HCL 1000 MG PO TABS: 1000.0000 mg | ORAL_TABLET | Freq: Two times a day (BID) | ORAL | 6 refills | Status: DC

## 2020-10-29 MED ORDER — BLOOD GLUCOSE METER KIT: PACK | 0 refills | Status: DC

## 2020-10-29 NOTE — Patient Instructions (Signed)
The biggest side effect of the pills is diarrhea.  Most people can avoid this if they start slowly.  The bottle will say take one pill twice daily (breakfast and evening meal.) Start by taking 1/2 pill each morning for one week Then 1/2 pill twice a day for a week. Then one pill in the morning and 1/2 pill at dinner for a week  Finally one pill twice a day.

## 2020-10-30 ENCOUNTER — Encounter: Payer: Self-pay | Admitting: Family Medicine

## 2020-10-30 NOTE — Assessment & Plan Note (Addendum)
I do not see any utility in repeating A1C today.  Will start metformin and order new glucometer.   Patient will return to PCP in 2-3 months for recheck. With her normal weight she may be a 1.5 diabetic. We shall see how she responds to the metformin.

## 2020-10-30 NOTE — Progress Notes (Signed)
    SUBJECTIVE:   CHIEF COMPLAINT / HPI:   Diabetes.  Newish onset.  Had gestational diabetes which apparently did not require insulin.  Then apparently diagnose 2 months ago with DM2 (A1C=7.7 on 09/08/20)  Has been trying to control with diet.  Home blood sugars running 170-200 fasting.  No pregnancy plans.  Ready to start taking meds.  Also states she needs a new glucometer.   Only symptom is general weakness and fatique.  Hgb in Sept was 11.5. No other focal symptoms.  PHQ9 does not suggest depression.      OBJECTIVE:   BP (!) 100/56   Pulse 85   Wt 152 lb 9.6 oz (69.2 kg)   SpO2 99%   BMI 25.39 kg/m   BMI of 25 noted.   Lungs clear Neuro Affect, cognition, motor, sensory and gait grossly normal.  ASSESSMENT/PLAN:   Controlled type 2 diabetes mellitus without complication, without long-term current use of insulin ([place]) I do not see any utility in repeating A1C today.  Will start metformin and order new glucometer.   Patient will return to PCP in 2-3 months for recheck. With her normal weight she may be a 1.5 diabetic. We shall see how she responds to the metformin.       Zenia Resides, MD [place]

## 2020-11-20 ENCOUNTER — Ambulatory Visit: Payer: Medicaid Other | Admitting: Obstetrics & Gynecology

## 2020-12-24 ENCOUNTER — Other Ambulatory Visit (HOSPITAL_COMMUNITY)
Admission: RE | Admit: 2020-12-24 | Discharge: 2020-12-24 | Disposition: A | Discharge status: Home / Self Care | Payer: Medicaid Other | Source: Ambulatory Visit | Attending: Family Medicine | Admitting: Family Medicine

## 2020-12-24 ENCOUNTER — Other Ambulatory Visit: Payer: Self-pay

## 2020-12-24 ENCOUNTER — Ambulatory Visit (INDEPENDENT_AMBULATORY_CARE_PROVIDER_SITE_OTHER): Payer: Medicaid Other | Admitting: Family Medicine

## 2020-12-24 ENCOUNTER — Encounter: Payer: Self-pay | Admitting: Family Medicine

## 2020-12-24 VITALS — BP 102/62 | HR 81 | Wt 152.0 lb

## 2020-12-24 DIAGNOSIS — N3000 Acute cystitis without hematuria: Secondary | ICD-10-CM | POA: Diagnosis not present

## 2020-12-24 DIAGNOSIS — R109 Unspecified abdominal pain: Secondary | ICD-10-CM | POA: Diagnosis not present

## 2020-12-24 DIAGNOSIS — Z113 Encounter for screening for infections with a predominantly sexual mode of transmission: Secondary | ICD-10-CM | POA: Diagnosis present

## 2020-12-24 DIAGNOSIS — R102 Pelvic and perineal pain: Secondary | ICD-10-CM | POA: Insufficient documentation

## 2020-12-24 DIAGNOSIS — E119 Type 2 diabetes mellitus without complications: Secondary | ICD-10-CM

## 2020-12-24 DIAGNOSIS — R300 Dysuria: Secondary | ICD-10-CM | POA: Insufficient documentation

## 2020-12-24 DIAGNOSIS — R350 Frequency of micturition: Secondary | ICD-10-CM | POA: Diagnosis not present

## 2020-12-24 HISTORY — DX: Pelvic and perineal pain: R10.2

## 2020-12-24 LAB — POCT URINALYSIS DIP (MANUAL ENTRY)
Bilirubin, UA: NEGATIVE
Glucose, UA: NEGATIVE mg/dL
Ketones, POC UA: NEGATIVE mg/dL
Nitrite, UA: NEGATIVE
Protein Ur, POC: NEGATIVE mg/dL
Spec Grav, UA: >=1.03 — AB (ref 1.010–1.025)
Urobilinogen, UA: 0.2 E.U./dL
pH, UA: 6.5 (ref 5.0–8.0)

## 2020-12-24 LAB — POCT GLYCOSYLATED HEMOGLOBIN (HGB A1C): Hemoglobin A1C: 11.1 % — AB (ref 4.0–5.6)

## 2020-12-24 LAB — POCT URINE PREGNANCY: Preg Test, Ur: NEGATIVE

## 2020-12-24 MED ORDER — SULFAMETHOXAZOLE-TRIMETHOPRIM 400-80 MG PO TABS: 1.0000 | ORAL_TABLET | Freq: Two times a day (BID) | ORAL | 0 refills | Status: DC

## 2020-12-24 NOTE — Progress Notes (Addendum)
SUBJECTIVE:   CHIEF COMPLAINT / HPI:   Abdominal, pelvic pain, urinary incontinence Began three weeks ago. Right sided abdominal pain, pelvic pain. Unknown if pelvic pain associated with urination. Painful urination (but suprapubic pain, not vaginal pain).  Feels she sometimes she cannot control her urine and intermittently loses small amounts.  Has children, youngest child is 36 months. Patient believes no way to be pregnant currently because she has Nexplanon. Has taken amoxicillin Wednesday before Christmas for tooth infection per dentist. No fever, chills. Some nausea but no emesis. Patient has recently experienced diarrhea and constipation; first diarrhea then constipation. Last BM yesterday. No muscle or joint aches.   Diabetes No issues taking the medications. No episodes of hypoglycemia. A1c today 11.1, up from 7.7 three months ago. POC glucose around 250. Checks FSBG TID. Fasting 170-195. Midday 250-260. Last PM check 195 ish. Taking metformin twice daily, hasn't missed any. Has changed diet; previously "respected my diet because I wasn't taking any medicine", now that she is on medicine she started "eating whatever I wanted". Didn't know if she should keep up her diet.  Used shared decision making, patient would like to restart diet and recheck A1c in three months.  She would like to avoid another diabetes medication if she can.  PERTINENT  PMH / PSH: Type 2 diabetes, gestational diabetes, chronic tension headache, irregular menstrual cycle, dyspareunia  OBJECTIVE:   BP 102/62   Pulse 81   Wt 152 lb (68.9 kg)   SpO2 99%   BMI 25.29 kg/m    PHQ-9:  Depression screen [facility] 2/9 12/24/2020 10/29/2020 09/15/2020  Decreased Interest 0 2 0  Down, Depressed, Hopeless 0 0 0  PHQ - 2 Score 0 2 0  Altered sleeping 0 0 0  Tired, decreased energy 0 2 3  Change in appetite 0 0 0  Feeling bad or failure about yourself  0 0 0  Trouble concentrating 0 0 0  Moving slowly or fidgety/restless 0  0 0  Suicidal thoughts 0 0 -  PHQ-9 Score 0 4 3  Difficult doing work/chores - - Not difficult at all     GAD-7:  GAD 7 : Generalized Anxiety Score 03/02/2019  Nervous, Anxious, on Edge 0  Control/stop worrying 0  Worry too much - different things 0  Trouble relaxing 0  Restless 0  Easily annoyed or irritable 0  Afraid - awful might happen 0  Total GAD 7 Score 0      Physical Exam General: Awake, alert, oriented Cardiovascular: Regular rate and rhythm, S1 and S2 present, no murmurs auscultated Respiratory: Lung fields clear to auscultation bilaterally Abdomen: Soft nondistended abdomen, mild TTP in lower quadrants and suprapubic, moderate TTP just medial to right anterior iliac crest, no rebound tenderness, absent guarding, no CVA tenderness bilaterally Extremities: No bilateral lower extremity edema, palpable pedal and pretibial pulses bilaterally Neuro: Cranial nerves II through X grossly intact, able to move all extremities spontaneously Vulva: Normal appearing vulva without rashes, lesions, or deformities Vagina: Pale pink rugated vaginal tissue without obvious lesions, physiologic discharge of whitish color, cervix without lesion or overt tenderness with swab   ASSESSMENT/PLAN:   Controlled type 2 diabetes mellitus without complication, without long-term current use of insulin (HCC) A1c today 11.1, up from 7.7 three months ago.  Used shared decision-making, patient would like to return to her "diabetic diet" recheck A1c in 3 months.  Dysuria Suprapubic discomfort with urination, wraps around right side.  Right-sided CVA tenderness.  UA  demonstrates cloudy urine, trace intact blood, specific gravity >1.030, small leukocytes, negative nitrites.  Although UA not entirely congruent with UTI, HPI and physical exam concerning for acute cystitis with possible ascending infection.  Vital signs stable, patient afebrile.  Prescribed Bactrim 400-80 mg twice daily x3 days.  Urine sent for  culture.  Suprapubic pain Negative pregnancy test.  Unremarkable physical exam and pelvic exam.  Collected vaginal swab for GC chlamydia, trichomonas, BV, yeast infection.  Probably related to likely UTI, however will follow up results of vaginal swab and treat as appropriate.     Fayette Pho, MD [facility]

## 2020-12-24 NOTE — Assessment & Plan Note (Signed)
A1c today 11.1, up from 7.7 three months ago.  Used shared decision-making, patient would like to return to her "diabetic diet" recheck A1c in 3 months.

## 2020-12-24 NOTE — Patient Instructions (Signed)
It was wonderful to meet you today. Thank you for allowing me to be a part of your care. Below is a short summary of what we discussed at your visit today:  Pelvic and abdominal pain I believe you have a UTI.  I have sent an antibiotic to your pharmacy.  You will take this medicine twice daily for total of 3 days.  Please let us know if you do not get better or if your symptoms worsen.  If you develop fever, chills or worsening nausea and vomiting, please call us.  I also performed a pelvic exam and swabbed your tissue.  The results should be back in 2 to 3 days.  If the results are normal, I will send you a letter with the normal results.  If anything comes back abnormal or positive, I will give you call with the test result and neck steps.  Diabetes Your A1c today was 11.1, up from 7.7 three months ago.  Even though you are taking medicine, you still need to follow a diabetic diet which includes lowering your carbs and eating foods with a low glycemic index.  Please take tear "diabetic diet" and follow-up in 3 months for another check of your blood sugar.  If your blood sugar is still very high at that time, your PCP may need to start you on another diabetes medicine.   If you have any questions or concerns, please do not hesitate to contact us via phone or MyChart message.   Fayette Pho, MD

## 2020-12-24 NOTE — Assessment & Plan Note (Addendum)
Suprapubic discomfort with urination, wraps around right side.  Right-sided CVA tenderness.  UA demonstrates cloudy urine, trace intact blood, specific gravity >1.030, small leukocytes, negative nitrites.  Although UA not entirely congruent with UTI, HPI and physical exam concerning for acute cystitis with possible ascending infection.  Vital signs stable, patient afebrile.  Prescribed Bactrim 400-80 mg twice daily x3 days.  Urine sent for culture.

## 2020-12-24 NOTE — Assessment & Plan Note (Signed)
Negative pregnancy test.  Unremarkable physical exam and pelvic exam.  Collected vaginal swab for GC chlamydia, trichomonas, BV, yeast infection.  Probably related to likely UTI, however will follow up results of vaginal swab and treat as appropriate.

## 2020-12-25 ENCOUNTER — Other Ambulatory Visit: Payer: Self-pay | Admitting: Family Medicine

## 2020-12-25 ENCOUNTER — Telehealth: Payer: Self-pay | Admitting: Family Medicine

## 2020-12-25 DIAGNOSIS — B3731 Acute candidiasis of vulva and vagina: Secondary | ICD-10-CM | POA: Insufficient documentation

## 2020-12-25 DIAGNOSIS — B373 Candidiasis of vulva and vagina: Secondary | ICD-10-CM | POA: Insufficient documentation

## 2020-12-25 LAB — CERVICOVAGINAL ANCILLARY ONLY
Bacterial Vaginitis (gardnerella): NEGATIVE
Candida Glabrata: NEGATIVE
Candida Vaginitis: POSITIVE — AB
Chlamydia: NEGATIVE
Comment: NEGATIVE
Comment: NEGATIVE
Comment: NEGATIVE
Comment: NEGATIVE
Comment: NEGATIVE
Comment: NORMAL
Neisseria Gonorrhea: NEGATIVE
Trichomonas: NEGATIVE

## 2020-12-25 MED ORDER — FLUCONAZOLE 150 MG PO TABS: 150.0000 mg | ORAL_TABLET | Freq: Once | ORAL | 0 refills | Status: AC

## 2020-12-25 NOTE — Telephone Encounter (Signed)
Called Teresa [name] with her cervicovaginal results, positive for Candida infection.  Discussed fluconazole one-time treatment, she is amenable.  Sent prescription to pharmacy.  All questions answered.  She also laughed as she told me that she just started her period today; so she believes the cramping she was complaining of at the visit is due to this.  Fayette Pho, MD

## 2020-12-25 NOTE — Assessment & Plan Note (Signed)
Confirmed with cervicovaginal swab 12/24/2020.  Treat with fluconazole 150 mg once.

## 2020-12-26 LAB — URINE CULTURE

## 2021-01-23 ENCOUNTER — Other Ambulatory Visit: Payer: Self-pay | Admitting: Family Medicine

## 2021-03-19 ENCOUNTER — Telehealth: Payer: Self-pay

## 2021-03-19 NOTE — Telephone Encounter (Signed)
Patient calls nurse line regarding heavy vaginal bleeding. Patient reports that she has been bleeding since OV Jan 5th. Patient reports that she is saturating maxi pads every 2-3 hours.   Patient reports that she is having dizziness and "bad pelvic pain." Advised patient to be evaluated in ED/ UC as we do not have any openings until Tuesday of next week. Patient declines stating that she has been having these same symptoms since January. Scheduled patient for next available appointment on Tuesday morning at 8:30.  Provided with strict ED precautions.   Talbot Grumbling, RN

## 2021-03-24 ENCOUNTER — Other Ambulatory Visit: Payer: Self-pay

## 2021-03-24 ENCOUNTER — Ambulatory Visit (INDEPENDENT_AMBULATORY_CARE_PROVIDER_SITE_OTHER): Payer: Medicaid Other | Admitting: Family Medicine

## 2021-03-24 VITALS — BP 100/60 | HR 84 | Ht 65.0 in | Wt 153.4 lb

## 2021-03-24 DIAGNOSIS — N920 Excessive and frequent menstruation with regular cycle: Secondary | ICD-10-CM

## 2021-03-24 DIAGNOSIS — E119 Type 2 diabetes mellitus without complications: Secondary | ICD-10-CM

## 2021-03-24 DIAGNOSIS — N939 Abnormal uterine and vaginal bleeding, unspecified: Secondary | ICD-10-CM

## 2021-03-24 DIAGNOSIS — N926 Irregular menstruation, unspecified: Secondary | ICD-10-CM

## 2021-03-24 DIAGNOSIS — D500 Iron deficiency anemia secondary to blood loss (chronic): Secondary | ICD-10-CM

## 2021-03-24 LAB — POCT GLYCOSYLATED HEMOGLOBIN (HGB A1C): HbA1c, POC (controlled diabetic range): 7.1 % — AB (ref 0.0–7.0)

## 2021-03-24 LAB — POCT URINE PREGNANCY: Preg Test, Ur: NEGATIVE

## 2021-03-24 LAB — POCT HEMOGLOBIN: Hemoglobin: 10.4 g/dL — AB (ref 11–14.6)

## 2021-03-24 MED ORDER — NAPROXEN 500 MG PO TABS: 500.0000 mg | ORAL_TABLET | Freq: Two times a day (BID) | ORAL | 0 refills | Status: DC

## 2021-03-24 MED ORDER — NORGESTIMATE-ETH ESTRADIOL 0.25-35 MG-MCG PO TABS: 1.0000 | ORAL_TABLET | Freq: Every day | ORAL | 2 refills | Status: DC

## 2021-03-24 NOTE — Progress Notes (Signed)
    SUBJECTIVE:   CHIEF COMPLAINT / HPI: Vaginal bleeding   Teresa [name] is a 40 year old female presenting to discuss persistent vaginal bleeding since mid 12/2020 after discontinuing breastfeeding.   Everyday bleeding requiring changing her pads frequently. Usually has abdominal cramping prior to episodes with more bleeding. Since this weekend, has seemed to lighten up slightly in quantity. She has also been feeling more tired and occasionally lightheaded. Has been taking BID iron since Jan for prophylactic measure (and history of anemia), most recently went to once daily iron last week. Has nexplanon in her left arm, placed 2020. History of 4 C-sections. Sexually active with her husband, denies concern for STDs. No recent fever, dysuria, vaginal itching/irriation, change in vaginal discharge. Does not want to check for STDs today. Denies any melena, hematochezia, or hematuria.   Prior to her most recent pregnancy, did have a history of irregular menstrual cycles however never had significant bleeding. She is a lifelong non-smoker. No personal or family history of blood clots.   PERTINENT  PMH / PSH: T2DM,  Family history of beta thalassemia (patient does not have this).   OBJECTIVE:   BP 100/60   Pulse 84   Ht 5\' 5"  (1.651 m)   Wt 153 lb 6 oz (69.6 kg)   LMP 03/24/2021   SpO2 99%   BMI 25.52 kg/m   General: Alert, NAD HEENT: NCAT, MMM Cardiac: RRR no m/g/r Lungs: Clear bilaterally, no increased WOB on RA. Abdomen: soft, non-tender, non-distended, normoactive BS Ext: Warm, dry, 2+ distal pulses, no edema b/l, normal gait. Full Nexplanon easily palpated in left upper arm.  Neuro: Alert and oriented, 5/5 upper and lower extremity strength bilaterally. EOMI, PERRLA.  Pelvic exam: VULVA: normal appearing vulva with no masses, tenderness or lesions, VAGINA: normal appearing vagina with normal color and discharge (no pooling of blood seen), no lesions, CERVIX: normal appearing cervix without  lesions, scant amount of brown discharge present from os, UTERUS: uterus is normal size, shape, consistency and nontender, ADNEXA: normal adnexa in size, nontender and no masses. Low transverse healed scar present with underlying scar tissue.   ASSESSMENT/PLAN:   Abnormal uterine bleeding Persistent vaginal bleeding since 12/2020, in the setting of discontinuing breast-feeding. Nexplanon correctly in place still. U Preg negative. No additional vaginal symptoms to suggest concern for precipitating infection, declines STD screening today (and low risk). Rx'd sprintec OCP's (counseled on common s/sx and increased clot risk) to assist in regulation and naproxen for additional decrease in bleeding/cramping relief. Will schedule U/S to r/o anatomical abnormality given heighten patient concern for this. Obtain CBC and TSH.   Iron deficiency anemia due to chronic blood loss POC Hgb 10.4 today, fortunately already has been on oral iron. Continue daily ferrous sulfate, may need to consider IV iron if persistently low/symptomatic despite cessation of bleeding. Confirm with CBC.   Controlled type 2 diabetes mellitus without complication, without long-term current use of insulin (HCC) A1c 7.1, much improved from 11 in 12/2020 with metformin and diabetic diet.     ED precautions discussed. F/u in 2 weeks, will check in with patient in a few days when labs return as well.   Patriciaann Clan, [place]

## 2021-03-24 NOTE — Patient Instructions (Addendum)
Keep taking iron everyday. You can also take the naproxen for the pelvic pain and reduce the bleeding.   Start the birth control pills--this should be temporary for the next few months.  We will schedule an ultrasound to see if there is anything else contributing.  F/u in 2-3 weeks.

## 2021-03-25 LAB — CBC
Hematocrit: 35.6 % (ref 34.0–46.6)
Hemoglobin: 11 g/dL — ABNORMAL LOW (ref 11.1–15.9)
MCH: 20.4 pg — ABNORMAL LOW (ref 26.6–33.0)
MCHC: 30.9 g/dL — ABNORMAL LOW (ref 31.5–35.7)
MCV: 66 fL — ABNORMAL LOW (ref 79–97)
Platelets: 304 10*3/uL (ref 150–450)
RBC: 5.39 x10E6/uL — ABNORMAL HIGH (ref 3.77–5.28)
RDW: 15.8 % — ABNORMAL HIGH (ref 11.7–15.4)
WBC: 7.5 10*3/uL (ref 3.4–10.8)

## 2021-03-25 LAB — TSH: TSH: 1.44 u[IU]/mL (ref 0.450–4.500)

## 2021-03-26 ENCOUNTER — Encounter: Payer: Self-pay | Admitting: Family Medicine

## 2021-03-26 DIAGNOSIS — N939 Abnormal uterine and vaginal bleeding, unspecified: Secondary | ICD-10-CM | POA: Insufficient documentation

## 2021-03-26 DIAGNOSIS — D500 Iron deficiency anemia secondary to blood loss (chronic): Secondary | ICD-10-CM | POA: Insufficient documentation

## 2021-03-26 NOTE — Assessment & Plan Note (Addendum)
Persistent vaginal bleeding since 12/2020, in the setting of discontinuing breast-feeding. Nexplanon correctly in place still. U Preg negative. No additional vaginal symptoms to suggest concern for precipitating infection, declines STD screening today (and low risk). Rx'd sprintec OCP's (counseled on common s/sx and increased clot risk) to assist in regulation and naproxen for additional decrease in bleeding/cramping relief. Will schedule U/S to r/o anatomical abnormality given heighten patient concern for this. Obtain CBC and TSH.

## 2021-03-26 NOTE — Assessment & Plan Note (Addendum)
POC Hgb 10.4 today, fortunately already has been on oral iron. Continue daily ferrous sulfate, may need to consider IV iron if persistently low/symptomatic despite cessation of bleeding. Confirm with CBC.

## 2021-03-26 NOTE — Assessment & Plan Note (Signed)
A1c 7.1, much improved from 11 in 12/2020 with metformin and diabetic diet.

## 2021-04-09 NOTE — Progress Notes (Signed)
   SUBJECTIVE:  CHIEF COMPLAINT / HPI:   Abnormal uterine bleeding follow-up Patient recently seen with Dr. Higinio Plan on 03/24/2021 for menorrhagia since January 2022.  She was scheduled for transvaginal ultrasound which is to be done 04/13/2021.  Her hemoglobin was 11.0 which appears to be around her baseline.  She has a history of iron deficiency anemia and on ferrous sulfate.  She was also prescribed Sprintec and naproxen.  She reports that this has  improved her symptoms. She stopped taking her sprintec 3 days ago when the bleeding stopped and she has started feeling the lower right pelvic pain as if she might start bleeding again.  She reports that she will be leaving for [place] from May 28 through September and is requesting refills of the Sprintec to avoid any bleeding complications while she is gone. No hx of blood clots, lifetime nonsmoker.   PERTINENT  PMH / PSH: Iron deficiency anemia due to chronic blood loss.  Sickle cell screening on 01/01/2016 normal.  Family history of beta thalassemia.    OBJECTIVE:  BP 98/62   Pulse 83   Ht 5\' 5"  (1.651 m)   Wt 150 lb 12.8 oz (68.4 kg)   LMP 03/24/2021   SpO2 100%   BMI 25.09 kg/m   General: well appearing, NAD  Heart: RRR, no m/r/g. 2+ radial pulses  Lungs: CTAB   ASSESSMENT/PLAN:  Abnormal uterine bleeding Patient reports resolution of bleeding after starting Sprintec.  She is not having any symptoms at this time.  She reports that she was going to [place] May 2- September and would like refills of her Sprintec.  We discussed precautions regarding estrogen containing pills and age.  See AVS.  Sent 5 months of Sprintec.  Patient also due for Pap and was scheduled to come back prior to leaving for [place].  She should also follow-up when she returns from [place] for further management.  Point-of-care hemoglobin today was 10.7.     Wilber Oliphant, MD [place]

## 2021-04-10 ENCOUNTER — Other Ambulatory Visit: Payer: Self-pay

## 2021-04-10 ENCOUNTER — Ambulatory Visit (INDEPENDENT_AMBULATORY_CARE_PROVIDER_SITE_OTHER): Payer: Medicaid Other | Admitting: Family Medicine

## 2021-04-10 ENCOUNTER — Encounter: Payer: Self-pay | Admitting: Family Medicine

## 2021-04-10 VITALS — BP 98/62 | HR 83 | Ht 65.0 in | Wt 150.8 lb

## 2021-04-10 DIAGNOSIS — N920 Excessive and frequent menstruation with regular cycle: Secondary | ICD-10-CM | POA: Diagnosis not present

## 2021-04-10 DIAGNOSIS — N939 Abnormal uterine and vaginal bleeding, unspecified: Secondary | ICD-10-CM

## 2021-04-10 DIAGNOSIS — D500 Iron deficiency anemia secondary to blood loss (chronic): Secondary | ICD-10-CM

## 2021-04-10 LAB — POCT HEMOGLOBIN: Hemoglobin: 10.7 g/dL — AB (ref 11–14.6)

## 2021-04-10 MED ORDER — NORGESTIMATE-ETH ESTRADIOL 0.25-35 MG-MCG PO TABS
1.0000 | ORAL_TABLET | Freq: Every day | ORAL | 0 refills | Status: DC
Start: 2021-04-10 — End: 2021-08-25

## 2021-04-10 NOTE — Assessment & Plan Note (Signed)
Patient reports resolution of bleeding after starting Sprintec.  She is not having any symptoms at this time.  She reports that she was going to [place] May 2- September and would like refills of her Sprintec.  We discussed precautions regarding estrogen containing pills and age.  See AVS.  Sent 5 months of Sprintec.  Patient also due for Pap and was scheduled to come back prior to leaving for [place].  She should also follow-up when she returns from [place] for further management.  Point-of-care hemoglobin today was 10.7.

## 2021-04-10 NOTE — Patient Instructions (Addendum)
Bleeding I am glad that you are feeling better.  We discussed continuing the Sprintec pills as you leave for [place].  I will send you 6 months of pills.  Please note that these pills contain estrogen and given your age (over the age of 38) you are at higher risk of blood clots and side effects having to do with extra estrogen in the pills.  While in [place], if you experience any swelling, redness, warmth in your legs or have any trouble breathing, it is important that you seek care immediately.  Do not smoke, and during your travels, make sure you are not sitting for long periods of time to decrease your risk of getting a blood clot.  Please make an appointment with [place] when you return from [place] for follow-up. We will also follow-up with your ultrasound. Your hemoglobin was stable today.   Health Maintenance You are due for a Pap smear.  You can schedule this prior to leaving for [place] or when you come back.   Norgestimate; Ethinyl Estradiol Tablets What is this medicine? NORGESTIMATE; ETHINYL ESTRADIOL (nor JES ti mate; ETH in il es tra DYE ole) is an oral contraceptive. The products combine two types of female hormones, an estrogen and a progestin. These products prevent ovulation and pregnancy. Some products are also used to treat acne in females. This medicine may be used for other purposes; ask your health care provider or pharmacist if you have questions. COMMON BRAND NAME(S): Estarylla, Mili, MONO-LINYAH, MonoNessa, Norgestimate/Ethinyl Estradiol, Ortho Tri-Cyclen, Ortho Tri-Cyclen Lo, Ortho-Cyclen, Previfem, Sprintec, Tri-Estarylla, TRI-LINYAH, Tri-Lo-Estarylla, Tri-Lo-Marzia, Tri-Lo-Mili, Tri-Lo-Sprintec, Tri-Mili, Tri-Previfem, Tri-Sprintec, Tri-VyLibra, Trinessa, Trinessa Lo, VyLibra What should I tell my health care provider before I take this medicine? They need to know if you have or ever had any of these conditions:  abnormal vaginal bleeding  blood vessel disease or blood  clots  breast, cervical, endometrial, ovarian, liver, or uterine cancer  diabetes  gallbladder disease  having surgery  heart disease or recent heart attack  high blood pressure  high cholesterol or triglycerides  history of irregular heartbeat or heart valve problems  kidney disease  liver disease  migraine headaches  protein C deficiency  protein S deficiency  recently had a baby, miscarriage, or abortion  stroke  systemic lupus erythematosus (SLE)  tobacco smoker  an unusual or allergic reaction to estrogens, progestins, other medicines, foods, dyes, or preservatives  pregnant or trying to get pregnant  breast-feeding How should I use this medicine? Take this medicine by mouth. To reduce nausea, this medicine may be taken with food. Follow the directions on the prescription label. Take this medicine at the same time each day and in the order directed on the package. Do not take your medicine more often than directed. Contact your pediatrician regarding the use of this medicine in children. Special care may be needed. This medicine has been used in female children who have started having menstrual periods. A patient package insert for the product will be given with each prescription and refill. Read this sheet carefully each time. The sheet may change frequently. Overdosage: If you think you have taken too much of this medicine contact a poison control center or emergency room at once. NOTE: This medicine is only for you. Do not share this medicine with others. What if I miss a dose? If you miss a dose, refer to the patient information sheet you received with your medication for directions on what to do. If you miss more than  one dose, this medication may not be as effective, and you may need to use another form of birth control. What may interact with this medicine? Do not take this medicine with the following medication:  dasabuvir; ombitasvir; paritaprevir;  ritonavir  ombitasvir; paritaprevir; ritonavir This medicine may also interact with the following medications:  acetaminophen  antibiotics or medicines for infections, especially rifampin, rifabutin, rifapentine, and griseofulvin, and possibly penicillins or tetracyclines  aprepitant  ascorbic acid (vitamin C)  atorvastatin  barbiturate medicines, such as phenobarbital  bosentan  carbamazepine  caffeine  clofibrate  cyclosporine  dantrolene  doxercalciferol  felbamate  grapefruit juice  hydrocortisone  medicines for anxiety or sleeping problems, such as diazepam or temazepam  medicines for diabetes, including pioglitazone  mineral oil  modafinil  mycophenolate  nefazodone  oxcarbazepine  phenytoin  prednisolone  ritonavir or other medicines for HIV infection or AIDS  rosuvastatin  selegiline  soy isoflavones supplements  St. John's wort  tamoxifen or raloxifene  theophylline  thyroid hormones  topiramate  warfarin This list may not describe all possible interactions. Give your health care provider a list of all the medicines, herbs, non-prescription drugs, or dietary supplements you use. Also tell them if you smoke, drink alcohol, or use illegal drugs. Some items may interact with your medicine. What should I watch for while using this medicine? Visit your doctor or health care professional for regular checks on your progress. You will need a regular breast and pelvic exam and Pap smear while on this medicine. You should also discuss the need for regular mammograms with your health care professional, and follow his or her guidelines for these tests. This medicine can make your body retain fluid, making your fingers, hands, or ankles swell. Your blood pressure can go up. Contact your doctor or health care professional if you feel you are retaining fluid. Use an additional method of contraception during the first cycle that you take these  tablets. If you have any reason to think you are pregnant, stop taking this medicine right away and contact your doctor or health care professional. If you are taking this medicine for hormone related problems, it may take several cycles of use to see improvement in your condition. Do not use this product if you smoke and are over 60 years of age. Smoking increases the risk of getting a blood clot or having a stroke while you are taking birth control pills, especially if you are more than 38 years old. If you are a smoker who is 27 years of age or younger, you are strongly advised not to smoke while taking birth control pills. This medicine can make you more sensitive to the sun. Keep out of the sun. If you cannot avoid being in the sun, wear protective clothing and use sunscreen. Do not use sun lamps or tanning beds/booths. If you wear contact lenses and notice visual changes, or if the lenses begin to feel uncomfortable, consult your eye care specialist. In some women, tenderness, swelling, or minor bleeding of the gums may occur. Notify your dentist if this happens. Brushing and flossing your teeth regularly may help limit this. See your dentist regularly and inform your dentist of the medicines you are taking. If you are going to have elective surgery, you may need to stop taking this medicine before the surgery. Consult your health care professional for advice. This medicine does not protect you against HIV infection (AIDS) or any other sexually transmitted diseases. What side effects may I  notice from receiving this medicine? Side effects that you should report to your doctor or health care professional as soon as possible:  allergic reactions such as skin rash or itching, hives, swelling of the lips, mouth, tongue, or throat  breast tissue changes or discharge  dark patches of skin on your forehead, cheeks, upper lip, and chin  depression  high blood pressure  migraines or severe, sudden  headaches  signs and symptoms of a blood clot such as breathing problems; changes in vision; chest pain; severe, sudden headache; pain, swelling, warmth in the leg; trouble speaking; sudden numbness or weakness of the face, arm or leg  stomach pain  symptoms of vaginal infection like itching, irritation or unusual discharge  yellowing of the eyes or skin Side effects that usually do not require medical attention (report these to your doctor or health care professional if they continue or are bothersome):  breast pain, tenderness  irregular vaginal bleeding or spotting, particularly during the first month of use  mild headache  nausea  weight gain (slight) This list may not describe all possible side effects. Call your doctor for medical advice about side effects. You may report side effects to FDA at 1-800-FDA-1088. Where should I keep my medicine? Keep out of the reach of children. Store at room temperature between 15 and 30 degrees C (59 and 86 degrees F). Throw away any unused medicine after the expiration date. NOTE: This sheet is a summary. It may not cover all possible information. If you have questions about this medicine, talk to your doctor, pharmacist, or health care provider.  2021 Elsevier/Gold Standard (2020-10-16 13:10:24)

## 2021-04-13 ENCOUNTER — Ambulatory Visit
Admission: RE | Admit: 2021-04-13 | Discharge: 2021-04-13 | Disposition: A | Discharge status: Home / Self Care | Payer: Medicaid Other | Source: Ambulatory Visit | Attending: Family Medicine | Admitting: Family Medicine

## 2021-04-13 ENCOUNTER — Other Ambulatory Visit: Payer: Self-pay

## 2021-04-13 DIAGNOSIS — N920 Excessive and frequent menstruation with regular cycle: Secondary | ICD-10-CM | POA: Insufficient documentation

## 2021-04-23 ENCOUNTER — Ambulatory Visit: Payer: Medicaid Other | Admitting: Family Medicine

## 2021-04-23 ENCOUNTER — Telehealth: Payer: Self-pay

## 2021-04-23 NOTE — Telephone Encounter (Signed)
Pt called wanted to inform Dr. Maudie [name] that she will not be coming in for today's apt @ 3:10pm, due to her husband passing this am. Please give pt a call, she was extremely distraught not able to say if she will reschedule or come back for future appointments.

## 2021-04-28 NOTE — Progress Notes (Signed)
    SUBJECTIVE:   CHIEF COMPLAINT / HPI:   Pap Smear  Patient returns today for pap smear. Last pap in 2019, NIL w/o cotesting. No hx of abnormal pap smears. We also discussed her menorrhagia.  She reports that her last menstrual cycle started last Friday.  She reports that her menstrual cycles are typically 8 days and that her bleeding is lighter at this time.  She does not have any concerning symptoms of anemia.  She is continue taking the Sprintec and iron.  PERTINENT  PMH / PSH: Abnormal uterine bleeding  OBJECTIVE:   BP 112/72   Pulse 95   Ht 5\' 5"  (1.651 m)   Wt 151 lb 3.2 oz (68.6 kg)   LMP 04/24/2021 (Exact Date)   SpO2 97%   BMI 25.16 kg/m   General: well appearing female, NAD GU: External vulva and vagina nonerythematous, without any obvious lesions or rash.  No abnormal discharge appreciated.  Normal ruggae of vaginal walls.  Cervix is non erythematous and non-friable.  There is no cervical motion tenderness, masses or gross abnormalities appreciated during bimanual exam.    ASSESSMENT/PLAN:   Screening for cervical cancer Pap smear obtained today. Patient declines STD testing.    Wilber Oliphant, MD [place]

## 2021-04-29 ENCOUNTER — Ambulatory Visit (INDEPENDENT_AMBULATORY_CARE_PROVIDER_SITE_OTHER): Payer: Medicaid Other | Admitting: Family Medicine

## 2021-04-29 ENCOUNTER — Encounter: Payer: Self-pay | Admitting: Family Medicine

## 2021-04-29 ENCOUNTER — Other Ambulatory Visit: Payer: Self-pay

## 2021-04-29 ENCOUNTER — Other Ambulatory Visit (HOSPITAL_COMMUNITY)
Admission: RE | Admit: 2021-04-29 | Discharge: 2021-04-29 | Disposition: A | Discharge status: Home / Self Care | Payer: Medicaid Other | Source: Ambulatory Visit | Attending: Family Medicine | Admitting: Family Medicine

## 2021-04-29 VITALS — BP 112/72 | HR 95 | Ht 65.0 in | Wt 151.2 lb

## 2021-04-29 DIAGNOSIS — N920 Excessive and frequent menstruation with regular cycle: Secondary | ICD-10-CM

## 2021-04-29 DIAGNOSIS — Z124 Encounter for screening for malignant neoplasm of cervix: Secondary | ICD-10-CM | POA: Diagnosis present

## 2021-04-29 NOTE — Assessment & Plan Note (Signed)
Pap smear obtained today. Patient declines STD testing.

## 2021-05-04 LAB — CYTOLOGY - PAP
Comment: NEGATIVE
Diagnosis: NEGATIVE
High risk HPV: NEGATIVE

## 2021-05-05 ENCOUNTER — Encounter: Payer: Self-pay | Admitting: Family Medicine

## 2021-05-05 NOTE — Progress Notes (Signed)
Normal results. Repeat pap in 5 years. Letter sent to patient

## 2021-05-26 ENCOUNTER — Ambulatory Visit: Payer: Medicaid Other | Attending: Internal Medicine

## 2021-05-26 DIAGNOSIS — Z20822 Contact with and (suspected) exposure to covid-19: Secondary | ICD-10-CM

## 2021-05-27 LAB — SARS-COV-2, NAA 2 DAY TAT

## 2021-05-27 LAB — NOVEL CORONAVIRUS, NAA: SARS-CoV-2, NAA: NOT DETECTED

## 2021-06-06 ENCOUNTER — Other Ambulatory Visit: Payer: Self-pay | Admitting: Family Medicine

## 2021-06-06 DIAGNOSIS — E119 Type 2 diabetes mellitus without complications: Secondary | ICD-10-CM

## 2021-06-08 NOTE — Telephone Encounter (Signed)
Patient needs follow up for DM for future refills. A1C 11.1 at last visit in January 2021.

## 2021-06-18 NOTE — Telephone Encounter (Signed)
Callled patient and there was no answer and no option for vm.

## 2021-06-21 ENCOUNTER — Other Ambulatory Visit: Payer: Self-pay | Admitting: Family Medicine

## 2021-06-21 DIAGNOSIS — E119 Type 2 diabetes mellitus without complications: Secondary | ICD-10-CM

## 2021-08-04 ENCOUNTER — Other Ambulatory Visit: Payer: Self-pay

## 2021-08-04 ENCOUNTER — Ambulatory Visit (INDEPENDENT_AMBULATORY_CARE_PROVIDER_SITE_OTHER): Payer: Medicaid Other

## 2021-08-04 DIAGNOSIS — Z111 Encounter for screening for respiratory tuberculosis: Secondary | ICD-10-CM | POA: Diagnosis present

## 2021-08-04 NOTE — Progress Notes (Signed)
Patient presents in nurse clinic for PPD placement. PPD placed in left ventral forearm.  Patient to return on 8/18 to have site read.  Reminder card given.

## 2021-08-06 ENCOUNTER — Ambulatory Visit (INDEPENDENT_AMBULATORY_CARE_PROVIDER_SITE_OTHER): Payer: Medicaid Other

## 2021-08-06 ENCOUNTER — Other Ambulatory Visit: Payer: Self-pay

## 2021-08-06 DIAGNOSIS — Z111 Encounter for screening for respiratory tuberculosis: Secondary | ICD-10-CM

## 2021-08-06 LAB — TB SKIN TEST
Induration: 4 mm
TB Skin Test: NEGATIVE

## 2021-08-06 NOTE — Progress Notes (Signed)
Patient is here for a PPD read.  It was placed on 08/04/2021 in the left forearm @ 10 am.  Upon assessment, area was noted to be red and have small amount of induration. Dr.Brown asked to assess area as well. Per provider assessment, results were negative with 4 mm induration. Recommends that in the future patient receive Quantiferon Gold for TB screening.   PPD RESULTS:  Result: negative Induration: 4 mm   Letter created and given to patient for documentation purposes. Talbot Grumbling, RN

## 2021-08-11 ENCOUNTER — Other Ambulatory Visit: Payer: Self-pay

## 2021-08-11 ENCOUNTER — Ambulatory Visit (INDEPENDENT_AMBULATORY_CARE_PROVIDER_SITE_OTHER): Payer: Medicaid Other | Admitting: Family Medicine

## 2021-08-11 VITALS — BP 113/61 | HR 96 | Temp 98.9°F

## 2021-08-11 DIAGNOSIS — Z20822 Contact with and (suspected) exposure to covid-19: Secondary | ICD-10-CM | POA: Diagnosis not present

## 2021-08-11 DIAGNOSIS — R059 Cough, unspecified: Secondary | ICD-10-CM | POA: Diagnosis not present

## 2021-08-11 DIAGNOSIS — B349 Viral infection, unspecified: Secondary | ICD-10-CM

## 2021-08-11 HISTORY — DX: Viral infection, unspecified: B34.9

## 2021-08-11 NOTE — Progress Notes (Signed)
    SUBJECTIVE:   CHIEF COMPLAINT / HPI:   Patients with past medical history of asthma presents with URI symptoms for about 6 days. Works as a Chartered certified accountant at [employer], was directed to [employer] [facility] [employer] where she tested negative for COVID at the time.  Cough with phlegm that is yellow, sore, throat, diarrhea (stopped having 3 days ago) and headache.  Notes an improvement in symptoms since they started. Has required to use an inhaler (albuterol), does not typically use it unless she is ill. Decreased appetite but able to keep fluids down to maintain hydration. She was unable to smell at first but now feels she is able to smell a little. Only issue now is that her cough persists.   OBJECTIVE:   BP 113/61   Pulse 96   Temp 98.9 F (37.2 C) (Oral)   LMP 07/24/2021   SpO2 100%   General: Patient well-groomed, in no acute distress. HEENT: bilateral anterior cervical lymphadenopathy noted, no buccal ulcers, rash or erythema noted CV: RRR, no murmurs or gallops Resp: CTAB, no wheezing, rales or rhonchi, good air movement throughout all lung fields without focal findings  Abdomen: soft, nontender, presence of bowel sounds Ext: radial and distal pulses strong and equal bilaterally  Neuro: normal gait Psych: mood appropriate   ASSESSMENT/PLAN:   Viral illness -based on history and physical exam, symptoms seem most consistent with viral illness. Less likely bacterial etiology such as pneumonia given non-focal findings on respiratory exam.  -COVID testing performed, will contact patient with results -supportive care and return precautions discussed      Donney Dice, [place]

## 2021-08-11 NOTE — Assessment & Plan Note (Signed)
-  based on history and physical exam, symptoms seem most consistent with viral illness. Less likely bacterial etiology such as pneumonia given non-focal findings on respiratory exam.  -COVID testing performed, will contact patient with results -supportive care and return precautions discussed

## 2021-08-11 NOTE — Patient Instructions (Addendum)
It was great seeing you today!  I am sorry that you are need feeling well, it seems that you have a viral infection that is causing your symptoms. Although you are feeling a little better, it is better to be tested for COVID. The COVID test results should be back within 24-48 hours.   Please make sure to drink plenty of fluids and get a lot of rest. Honey may help for your cough along with using a humidifier and vapor vicks rub. Warm fluids such as tea can help.   If you are having any trouble breathing or not able to take fluids or get worse then please go to the ED.  Please follow up at your next scheduled appointment, if anything arises between now and then, please don't hesitate to contact our office.   Thank you for allowing [place] to be a part of your medical care!  Thank you, Dr. Larae Grooms

## 2021-08-12 ENCOUNTER — Other Ambulatory Visit: Payer: Self-pay | Admitting: Family Medicine

## 2021-08-14 LAB — SARS-COV-2, NAA 2 DAY TAT

## 2021-08-14 LAB — NOVEL CORONAVIRUS, NAA: SARS-CoV-2, NAA: NOT DETECTED

## 2021-08-25 ENCOUNTER — Other Ambulatory Visit: Payer: Self-pay | Admitting: Family Medicine

## 2021-08-25 ENCOUNTER — Ambulatory Visit (INDEPENDENT_AMBULATORY_CARE_PROVIDER_SITE_OTHER): Payer: Medicaid Other

## 2021-08-25 ENCOUNTER — Other Ambulatory Visit: Payer: Self-pay

## 2021-08-25 DIAGNOSIS — N920 Excessive and frequent menstruation with regular cycle: Secondary | ICD-10-CM

## 2021-08-25 DIAGNOSIS — Z23 Encounter for immunization: Secondary | ICD-10-CM | POA: Diagnosis not present

## 2021-08-26 NOTE — Progress Notes (Signed)
Patient presented for Flu shot.  No vitals were taken at appointment other than temperature.  Teresa [name], [place]

## 2021-10-23 ENCOUNTER — Other Ambulatory Visit: Payer: Self-pay | Admitting: Family Medicine

## 2021-10-23 DIAGNOSIS — N920 Excessive and frequent menstruation with regular cycle: Secondary | ICD-10-CM

## 2021-11-03 NOTE — Patient Instructions (Signed)
It was nice seeing you today! ° ° ° °Please arrive at least 15 minutes prior to your scheduled appointments. ° °Stay well, °Corey Laski, MD °[facility] °[phone]  °

## 2021-11-03 NOTE — Progress Notes (Signed)
    SUBJECTIVE:   CHIEF COMPLAINT / HPI: back/pelvic pain  Back pain and pelvic pain ongoing since January for 10 months. Was on naproxen since June which resolved the pain. Pain returned 2 months ago but no longer has naproxen. Pain is intermittent and severe, sometimes with nausea and vomiting. Feels pelvis is "going to explode." Occurs multiple times per week and will subside. Not taking any medications currently. She is on OCP for contraception. This helped with her vaginal bleeding, however she states she still has bleeding when she has these attacks of pelvic pain. Denies fever, urinary symptoms, vaginal discharge. Prior transvaginal pelvic US revealed two small uterine fibroids up to 1.3 cm and endometrial stripe 1.7 mm. She also mentioned numbness and tingling on the right side of her body.  PERTINENT  PMH / PSH: AUB, fibroids  OBJECTIVE:   BP 103/67   Pulse 85   Ht 5\' 5"  (1.651 m)   Wt 154 lb (69.9 kg)   LMP 11/03/2021   SpO2 100%   BMI 25.63 kg/m   General: appears uncomfortable, NAD CV: RRR, no murmurs Pulm: CTAB, no wheezes or rales Abd: soft, tender to lower abdomen, no rebound tenderness or peritoneal signs, +BS MSK: no tenderness along midline spine or paraspinal muscles Neuro: full strength in lower extremities GU: chaperone present. No external lesions. Moderate amount of dark red blood in vaginal vault, no sites of active bleeding seen. No discharge visualized. Cervix was not well visualized. Bilateral adnexal tenderness.  ASSESSMENT/PLAN:   Severe pelvic pain Severe intermittent pelvic pain occurring multiple times per week for the past 2 months with associated nausea and vomiting. Initially comfortable appearing but developed severe onset of pain during encounter during examination and severe to the point she fell over while walking to the bathroom to give urine sample. Exam notable for bilateral adnexal tenderness and blood in vaginal vault. Urine pregnancy  negative. Though symptoms are possibly related to fibroids, given severity of pain, I feel she should be emergently evaluated in the ED to rule out ovarian torsion. Case was discussed with ED attending [place]. CMA to transport patient to ED in wheelchair.   HCM - Covid booster declined  Zola Button, MD [place]

## 2021-11-04 ENCOUNTER — Emergency Department (HOSPITAL_COMMUNITY): Payer: Medicaid Other

## 2021-11-04 ENCOUNTER — Emergency Department (HOSPITAL_COMMUNITY)
Admission: EM | Admit: 2021-11-04 | Discharge: 2021-11-04 | Disposition: A | Discharge status: Home / Self Care | Payer: Medicaid Other | Attending: Emergency Medicine | Admitting: Emergency Medicine

## 2021-11-04 ENCOUNTER — Encounter (HOSPITAL_COMMUNITY): Payer: Self-pay | Admitting: *Deleted

## 2021-11-04 ENCOUNTER — Other Ambulatory Visit: Payer: Self-pay

## 2021-11-04 ENCOUNTER — Ambulatory Visit (INDEPENDENT_AMBULATORY_CARE_PROVIDER_SITE_OTHER): Payer: Medicaid Other | Admitting: Family Medicine

## 2021-11-04 VITALS — BP 103/67 | HR 85 | Ht 65.0 in | Wt 154.0 lb

## 2021-11-04 DIAGNOSIS — D259 Leiomyoma of uterus, unspecified: Secondary | ICD-10-CM | POA: Insufficient documentation

## 2021-11-04 DIAGNOSIS — N938 Other specified abnormal uterine and vaginal bleeding: Secondary | ICD-10-CM | POA: Insufficient documentation

## 2021-11-04 DIAGNOSIS — E119 Type 2 diabetes mellitus without complications: Secondary | ICD-10-CM | POA: Insufficient documentation

## 2021-11-04 DIAGNOSIS — R102 Pelvic and perineal pain: Secondary | ICD-10-CM

## 2021-11-04 DIAGNOSIS — Z7984 Long term (current) use of oral hypoglycemic drugs: Secondary | ICD-10-CM | POA: Insufficient documentation

## 2021-11-04 DIAGNOSIS — D219 Benign neoplasm of connective and other soft tissue, unspecified: Secondary | ICD-10-CM

## 2021-11-04 DIAGNOSIS — J45909 Unspecified asthma, uncomplicated: Secondary | ICD-10-CM | POA: Insufficient documentation

## 2021-11-04 DIAGNOSIS — R109 Unspecified abdominal pain: Secondary | ICD-10-CM | POA: Diagnosis present

## 2021-11-04 DIAGNOSIS — M549 Dorsalgia, unspecified: Secondary | ICD-10-CM | POA: Diagnosis not present

## 2021-11-04 DIAGNOSIS — K219 Gastro-esophageal reflux disease without esophagitis: Secondary | ICD-10-CM | POA: Diagnosis not present

## 2021-11-04 DIAGNOSIS — N939 Abnormal uterine and vaginal bleeding, unspecified: Secondary | ICD-10-CM

## 2021-11-04 LAB — BASIC METABOLIC PANEL
Anion gap: 9 (ref 5–15)
BUN: 7 mg/dL (ref 6–20)
CO2: 24 mmol/L (ref 22–32)
Calcium: 8.9 mg/dL (ref 8.9–10.3)
Chloride: 102 mmol/L (ref 98–111)
Creatinine, Ser: 0.6 mg/dL (ref 0.44–1.00)
GFR, Estimated: >60 mL/min (ref >60)
Glucose, Bld: 103 mg/dL — ABNORMAL HIGH (ref 70–99)
Potassium: 3.7 mmol/L (ref 3.5–5.1)
Sodium: 135 mmol/L (ref 135–145)

## 2021-11-04 LAB — CBC WITH DIFFERENTIAL/PLATELET
Abs Immature Granulocytes: 0.01 10*3/uL (ref 0.00–0.07)
Basophils Absolute: 0 10*3/uL (ref 0.0–0.1)
Basophils Relative: 1 %
Eosinophils Absolute: 0.6 10*3/uL — ABNORMAL HIGH (ref 0.0–0.5)
Eosinophils Relative: 9 %
HCT: 36 % (ref 36.0–46.0)
Hemoglobin: 10.9 g/dL — ABNORMAL LOW (ref 12.0–15.0)
Immature Granulocytes: 0 %
Lymphocytes Relative: 42 %
Lymphs Abs: 2.7 10*3/uL (ref 0.7–4.0)
MCH: 20.4 pg — ABNORMAL LOW (ref 26.0–34.0)
MCHC: 30.3 g/dL (ref 30.0–36.0)
MCV: 67.4 fL — ABNORMAL LOW (ref 80.0–100.0)
Monocytes Absolute: 0.3 10*3/uL (ref 0.1–1.0)
Monocytes Relative: 5 %
Neutro Abs: 2.7 10*3/uL (ref 1.7–7.7)
Neutrophils Relative %: 43 %
Platelets: 237 10*3/uL (ref 150–400)
RBC: 5.34 MIL/uL — ABNORMAL HIGH (ref 3.87–5.11)
RDW: 16.7 % — ABNORMAL HIGH (ref 11.5–15.5)
Smear Review: ADEQUATE
WBC: 6.3 10*3/uL (ref 4.0–10.5)
nRBC: 0 % (ref 0.0–0.2)

## 2021-11-04 LAB — URINALYSIS, ROUTINE W REFLEX MICROSCOPIC
Bilirubin Urine: NEGATIVE
Glucose, UA: NEGATIVE mg/dL
Ketones, ur: NEGATIVE mg/dL
Leukocytes,Ua: NEGATIVE
Nitrite: NEGATIVE
Protein, ur: NEGATIVE mg/dL
RBC / HPF: >50 RBC/hpf — ABNORMAL HIGH (ref 0–5)
Specific Gravity, Urine: 1.008 (ref 1.005–1.030)
pH: 5 (ref 5.0–8.0)

## 2021-11-04 LAB — TYPE AND SCREEN
ABO/RH(D): B POS
Antibody Screen: NEGATIVE

## 2021-11-04 LAB — POCT URINE PREGNANCY: Preg Test, Ur: NEGATIVE

## 2021-11-04 NOTE — Discharge Instructions (Signed)
Please call the phone number provided to set up an appointment with the gynecologist for further evaluation and management of your fibroids, pain and bleeding.  Continue taking ibuprofen 600 mg every 6-8 hours for pain control.  Take the medicine with food.  Also consider taking Pepcid or Zantac with the medication if you notice that you are using the medicine frequently.

## 2021-11-04 NOTE — ED Triage Notes (Signed)
Pt reports pelvic cramping and intermittent back pain that has been ongoing since January. Had abnormal vaginal bleeding, had to take meds to get it to stop and now started back last week. Denies problems with urination or fever.

## 2021-11-04 NOTE — ED Provider Notes (Signed)
[facility] EMERGENCY DEPARTMENT Provider Note   CSN: [id number] Arrival date & time: 11/04/21  1125     History Chief Complaint  Patient presents with   Abdominal Pain   Back Pain    Teresa [name] is a 40 y.o. female.  HPI    40 year old female comes in with chief complaint of abdominal pain and back pain.  Patient reports that since January she has been having some abdominal pain in the lower quadrants.  She is also having intermittent, irregular bleeding.  Pain is described as severe pain, sometimes the pain will last 30 minutes other times little longer.  The pain frequency has become more regular and is a daily occurrence now.  Intensity has gone up.  Bleeding was heavier, but she is now on oral contraceptives and the bleeding has improved.  Patient reports no family history of cancer.  Although she reports that her appetite has worsened, there is no weight loss, night sweats.  Past Medical History:  Diagnosis Date   Anemia    Arthritis    Asthma    Ventolin   GERD (gastroesophageal reflux disease)    Gestational diabetes    Irregular menstrual cycle 05/25/2013   Palpitations 03/17/2017   Physical exam 09/11/2020   Suprapubic pain 12/24/2020   Ulcer     Patient Active Problem List   Diagnosis Date Noted   Viral illness 08/11/2021   Screening for cervical cancer 04/29/2021   Abnormal uterine bleeding 03/26/2021   Iron deficiency anemia due to chronic blood loss 03/26/2021   Dyspareunia, female 09/15/2020   History of polyhydramnios 04/23/2019   Chronic tension headache 02/23/2018   Controlled type 2 diabetes mellitus without complication, without long-term current use of insulin ([place]) 03/17/2017   Family history of beta thalassemia 01/15/2016   Previous cesarean delivery affecting pregnancy, antepartum 04/06/2012    Past Surgical History:  Procedure Laterality Date   CESAREAN SECTION     CESAREAN SECTION  04/06/2012   Procedure: CESAREAN  SECTION;  Surgeon: Lahoma Crocker, MD;  Location: [facility];  Service: Gynecology;  Laterality: N/A;   CESAREAN SECTION N/A 06/28/2016   Procedure: CESAREAN SECTION;  Surgeon: Lavonia Drafts, MD;  Location: [facility];  Service: Obstetrics;  Laterality: N/A;   CESAREAN SECTION N/A 04/24/2019   Procedure: REPEAT CESAREAN SECTION;  Surgeon: Truett Mainland, DO;  Location: [facility];  Service: Obstetrics;  Laterality: N/A;   SCAR REVISION N/A 04/24/2019   Procedure: SCAR REVISION;  Surgeon: Truett Mainland, DO;  Location: [facility];  Service: Obstetrics;  Laterality: N/A;     OB History     Gravida  4   Para  3   Term  3   Preterm      AB      Living  3      SAB      IAB      Ectopic      Multiple  0   Live Births  3           Family History  Problem Relation Age of Onset   Diabetes Mother    Hypertension Mother    Other Son        Beta Thalassemia trait (minor)   Colon cancer Neg Hx    Esophageal cancer Neg Hx    Rectal cancer Neg Hx    Stomach cancer Neg Hx     Social History   Tobacco Use  Smoking status: Never   Smokeless tobacco: Never  Vaping Use   Vaping Use: Never used  Substance Use Topics   Alcohol use: No   Drug use: No    Home Medications Prior to Admission medications   Medication Sig Start Date End Date Taking? Authorizing Provider  albuterol (PROVENTIL HFA;VENTOLIN HFA) 108 (90 Base) MCG/ACT inhaler Inhale 3 puffs into the lungs at bedtime.   Yes [provider]  cetirizine (ZYRTEC) 10 MG tablet Take 10 mg by mouth at bedtime.   Yes [provider]  ferrous sulfate 325 (65 FE) MG tablet Take 325 mg by mouth at bedtime.   Yes [provider]  fluticasone (FLONASE) 50 MCG/ACT nasal spray Place 2 sprays into both nostrils daily. Patient taking differently: Place 2 sprays into both nostrils daily as needed for allergies. 02/06/18  Yes Mikell, Jeani Sow, MD  ibuprofen (ADVIL) 200 MG tablet Take  400 mg by mouth every 6 (six) hours as needed for mild pain or moderate pain.   Yes [provider]  metFORMIN (GLUCOPHAGE) 1000 MG tablet Take 1 tablet (1,000 mg total) by mouth 2 (two) times daily with a meal. Patient taking differently: Take 1,000 mg by mouth 3 (three) times daily. 06/23/21 12/26/21 Yes Ezequiel Essex, MD  montelukast (SINGULAIR) 10 MG tablet Take 10 mg by mouth every evening.    Yes [provider]  naproxen (NAPROSYN) 500 MG tablet Take 1 tablet (500 mg total) by mouth 2 (two) times daily with a meal. Patient taking differently: Take 500 mg by mouth 2 (two) times daily as needed for moderate pain or mild pain. 03/24/21  Yes Beard, Ralph Leyden, DO  norgestimate-ethinyl estradiol (ORTHO-CYCLEN) 0.25-35 MG-MCG tablet TAKE 1 TABLET BY MOUTH EVERY DAY Patient taking differently: Take 1 tablet by mouth daily. 10/26/21  Yes Ezequiel Essex, MD  Olopatadine HCl 0.2 % SOLN Place 1 drop into both eyes daily as needed for itching.   Yes [provider]  ACCU-CHEK GUIDE test strip TEST BLOOD SUGAR DAILY 01/23/21   Milus Banister C, DO  blood glucose meter kit and supplies Dispense based on patient and insurance preference. Test blood sugar  daily (FOR ICD-10 E10.9, E11.9). 10/29/20   Zenia Resides, MD    Allergies    Patient has no known allergies.  Review of Systems   Review of Systems  Constitutional:  Positive for activity change.  Respiratory:  Negative for shortness of breath.   Cardiovascular:  Negative for chest pain.  Gastrointestinal:  Positive for abdominal pain.  Genitourinary:  Positive for dyspareunia and vaginal bleeding.  All other systems reviewed and are negative.  Physical Exam Updated Vital Signs BP 112/65   Pulse 88   Temp 98.1 F (36.7 C) (Oral)   Resp (!) 21   LMP 11/03/2021   SpO2 100%   Physical Exam Vitals and nursing note reviewed.  Constitutional:      Appearance: She is well-developed.  HENT:     Head: Atraumatic.   Cardiovascular:     Rate and Rhythm: Normal rate.  Pulmonary:     Effort: Pulmonary effort is normal.  Abdominal:     Tenderness: There is abdominal tenderness in the suprapubic area and left lower quadrant.  Musculoskeletal:     Cervical back: Normal range of motion and neck supple.  Skin:    General: Skin is warm and dry.  Neurological:     Mental Status: She is alert and oriented to person, place, and time.  ED Results / Procedures / Treatments   Labs (all labs ordered are listed, but only abnormal results are displayed) Labs Reviewed  CBC WITH DIFFERENTIAL/PLATELET - Abnormal; Notable for the following components:      Result Value   RBC 5.34 (*)    Hemoglobin 10.9 (*)    MCV 67.4 (*)    MCH 20.4 (*)    RDW 16.7 (*)    Eosinophils Absolute 0.6 (*)    All other components within normal limits  URINALYSIS, ROUTINE W REFLEX MICROSCOPIC - Abnormal; Notable for the following components:   APPearance HAZY (*)    Hgb urine dipstick LARGE (*)    RBC / HPF >50 (*)    Bacteria, UA RARE (*)    All other components within normal limits  BASIC METABOLIC PANEL - Abnormal; Notable for the following components:   Glucose, Bld 103 (*)    All other components within normal limits  TYPE AND SCREEN    EKG None  Radiology US PELVIC COMPLETE W TRANSVAGINAL AND TORSION R/O  Result Date: 11/04/2021 CLINICAL DATA:  Vaginal bleeding, cramping EXAM: TRANSABDOMINAL AND TRANSVAGINAL ULTRASOUND OF PELVIS DOPPLER ULTRASOUND OF OVARIES TECHNIQUE: Both transabdominal and transvaginal ultrasound examinations of the pelvis were performed. Transabdominal technique was performed for global imaging of the pelvis including uterus, ovaries, adnexal regions, and pelvic cul-de-sac. It was necessary to proceed with endovaginal exam following the transabdominal exam to visualize the endometrium. Color and duplex Doppler ultrasound was utilized to evaluate blood flow to the ovaries. COMPARISON:  04/13/2021  FINDINGS: Uterus Measurements: 10.4 x 6.3 x 5.9 cm = volume: 202 mL. Retroverted. Hypoechoic fibroid in the right uterine body measuring up to 1.5 cm. Anterior uterine body fibroid measures approximately 4.7 x 3.2 x 3.2 cm. Endometrium Thickness: 7 mm.  No focal abnormality visualized. Right ovary Measurements: 4.3 x 3.0 x 3.2 cm = volume: 21 mL. Normal appearance/no adnexal mass. Left ovary Measurements: 3.4 x 2.4 x 2.5 cm = volume: 10 mL. Normal appearance/no adnexal mass. Pulsed Doppler evaluation of both ovaries demonstrates normal low-resistance arterial and venous waveforms. Other findings Trace free fluid within the pelvis. IMPRESSION: 1. No evidence of adnexal torsion. 2. Fibroid uterus. 3. Endometrial thickness of 7 mm. If bleeding remains unresponsive to hormonal or medical therapy, sonohysterogram should be considered for focal lesion work-up. (Ref: Radiological Reasoning: Algorithmic Workup of Abnormal Vaginal Bleeding with Endovaginal Sonography and Sonohysterography. AJR 2008; 235:T73-22) Electronically Signed   By: Davina Poke D.O.   On: 11/04/2021 13:40    Procedures Procedures   Medications Ordered in ED Medications - No data to display  ED Course  I have reviewed the triage vital signs and the nursing notes.  Pertinent labs & imaging results that were available during my care of the patient were reviewed by me and considered in my medical decision making (see chart for details).    MDM Rules/Calculators/A&P                           40 year old female comes in a chief complaint of pain.  She is currently not having any severe pain.  She has been having intermittent episodes of recurrent lower quadrant abdominal pain, that has become more intense over time.  She is also on OCPs for DU B.  Ultrasound was ordered.  I had heard from patient's PCP, there were concern for torsion type pathology.  Pretest probability for it was low given the duration, but  given the intensity of  the pain we did get ultrasound to ensure there was no torsion.  Ultrasound is reassuring.  Patient does have fibroids.  At this time, we recommend that patient follow-up with a gynecologist for optimization of her dyspareunia, dysfunctional bleeding and recurrent pelvic pain that we think is because of fibroids.  Final Clinical Impression(s) / ED Diagnoses Final diagnoses:  Pelvic pain  DUB (dysfunctional uterine bleeding)  Fibroids    Rx / DC Orders ED Discharge Orders     None        Varney Biles, MD 11/04/21 1504

## 2021-11-04 NOTE — ED Provider Notes (Signed)
Emergency Medicine Provider Triage Evaluation Note  Teresa [name] , a 40 y.o. female  was evaluated in triage.  Pt complains of vaginal bleeding and pelvic pain for the last few months.  She has been passing large blood clots.  This happened earlier this year and she was started on OCPs.  She is unable to distinguish her menstrual period from her continuous heavy bleeding.  Review of Systems  Positive:  Negative: Dizziness, shortness of breath, syncope  Physical Exam  BP 108/69 (BP Location: Right Arm)   Pulse 75   Temp 97.9 F (36.6 C)   Resp 16   LMP 11/03/2021   SpO2 100%  Gen:   Awake, no distress   Resp:  Normal effort  MSK:   Moves extremities without difficulty  Other:  Lower quadrant.  Medical Decision Making  Medically screening exam initiated at 12:14 PM.  Appropriate orders placed.  Teresa [name] was informed that the remainder of the evaluation will be completed by another provider, this initial triage assessment does not replace that evaluation, and the importance of remaining in the ED until their evaluation is complete.     Rhae Hammock, PA-C 11/04/21 1216    Lorelle Gibbs, DO 11/04/21 1408

## 2021-11-05 ENCOUNTER — Telehealth: Payer: Self-pay | Admitting: Family Medicine

## 2021-11-05 DIAGNOSIS — N920 Excessive and frequent menstruation with regular cycle: Secondary | ICD-10-CM

## 2021-11-05 MED ORDER — NAPROXEN 500 MG PO TABS: 500.0000 mg | ORAL_TABLET | Freq: Two times a day (BID) | ORAL | 0 refills | Status: DC | PRN

## 2021-11-05 NOTE — Telephone Encounter (Signed)
Call patient to follow-up yesterday's visit and ED visit.  Pelvic ultrasound without evidence of torsion.  She was referred to OB/GYN and has an appointment in January.  In the meantime, I offered to refill her naproxen which she was agreeable to.  Patient expressed gratitude for the call and for sending her to the ED yesterday.

## 2021-11-27 ENCOUNTER — Ambulatory Visit: Payer: Medicaid Other

## 2021-12-12 ENCOUNTER — Encounter (HOSPITAL_COMMUNITY): Payer: Self-pay

## 2021-12-12 ENCOUNTER — Ambulatory Visit (HOSPITAL_COMMUNITY)
Admission: EM | Admit: 2021-12-12 | Discharge: 2021-12-12 | Disposition: A | Discharge status: Home / Self Care | Payer: Medicaid Other | Attending: Internal Medicine | Admitting: Internal Medicine

## 2021-12-12 ENCOUNTER — Other Ambulatory Visit: Payer: Self-pay

## 2021-12-12 ENCOUNTER — Ambulatory Visit (INDEPENDENT_AMBULATORY_CARE_PROVIDER_SITE_OTHER): Payer: Medicaid Other

## 2021-12-12 DIAGNOSIS — R059 Cough, unspecified: Secondary | ICD-10-CM

## 2021-12-12 DIAGNOSIS — J029 Acute pharyngitis, unspecified: Secondary | ICD-10-CM | POA: Diagnosis not present

## 2021-12-12 DIAGNOSIS — J209 Acute bronchitis, unspecified: Secondary | ICD-10-CM

## 2021-12-12 DIAGNOSIS — R042 Hemoptysis: Secondary | ICD-10-CM | POA: Diagnosis not present

## 2021-12-12 MED ORDER — HYDROCOD POLST-CPM POLST ER 10-8 MG/5ML PO SUER: 5.0000 mL | Freq: Two times a day (BID) | ORAL | 0 refills | Status: DC | PRN

## 2021-12-12 MED ORDER — AZITHROMYCIN 250 MG PO TABS: ORAL_TABLET | ORAL | 0 refills | Status: DC

## 2021-12-12 MED ORDER — PREDNISONE 20 MG PO TABS
60.0000 mg | ORAL_TABLET | Freq: Once | ORAL | Status: AC
  Administered 2021-12-12: 14:00:00 60 mg via ORAL

## 2021-12-12 MED ORDER — METHYLPREDNISOLONE 4 MG PO TBPK: ORAL_TABLET | ORAL | 0 refills | Status: DC

## 2021-12-12 MED ORDER — PREDNISONE 20 MG PO TABS
ORAL_TABLET | ORAL | Status: AC
  Filled 2021-12-12: qty 3

## 2021-12-12 NOTE — ED Triage Notes (Signed)
Pt presents with non productive cough, sore throat, headache, and generalized body aches X 2 weeks.

## 2021-12-12 NOTE — ED Provider Notes (Signed)
[facility]    CSN: [id number] Arrival date & time: 12/12/21  1227      History   Chief Complaint Chief Complaint  Patient presents with   URI   Headache   Generalized Body Aches    HPI Teresa [name] is a 40 y.o. female who presents with productive cough, ST and body aches x 2 weeks. This am saw some blood after coughing and when she blew her nose. OTC meds have not helped her. Has been chilling and the aches come only after 6 pm, but has not checked her temp. Has hx of asthma and been using her inhalers. The cough is keeping her up at night time.     Past Medical History:  Diagnosis Date   Anemia    Arthritis    Asthma    Ventolin   GERD (gastroesophageal reflux disease)    Gestational diabetes    Irregular menstrual cycle 05/25/2013   Palpitations 03/17/2017   Physical exam 09/11/2020   Suprapubic pain 12/24/2020   Ulcer     Patient Active Problem List   Diagnosis Date Noted   Viral illness 08/11/2021   Screening for cervical cancer 04/29/2021   Abnormal uterine bleeding 03/26/2021   Iron deficiency anemia due to chronic blood loss 03/26/2021   Dyspareunia, female 09/15/2020   History of polyhydramnios 04/23/2019   Chronic tension headache 02/23/2018   Controlled type 2 diabetes mellitus without complication, without long-term current use of insulin ([place]) 03/17/2017   Family history of beta thalassemia 01/15/2016   Previous cesarean delivery affecting pregnancy, antepartum 04/06/2012    Past Surgical History:  Procedure Laterality Date   CESAREAN SECTION     CESAREAN SECTION  04/06/2012   Procedure: CESAREAN SECTION;  Surgeon: Lahoma Crocker, MD;  Location: [facility];  Service: Gynecology;  Laterality: N/A;   CESAREAN SECTION N/A 06/28/2016   Procedure: CESAREAN SECTION;  Surgeon: Lavonia Drafts, MD;  Location: [facility];  Service: Obstetrics;  Laterality: N/A;   CESAREAN SECTION N/A 04/24/2019   Procedure: REPEAT CESAREAN SECTION;   Surgeon: Truett Mainland, DO;  Location: [facility];  Service: Obstetrics;  Laterality: N/A;   SCAR REVISION N/A 04/24/2019   Procedure: SCAR REVISION;  Surgeon: Truett Mainland, DO;  Location: [facility];  Service: Obstetrics;  Laterality: N/A;    OB History     Gravida  4   Para  3   Term  3   Preterm      AB      Living  3      SAB      IAB      Ectopic      Multiple  0   Live Births  3            Home Medications    Prior to Admission medications   Medication Sig Start Date End Date Taking? Authorizing Provider  azithromycin (ZITHROMAX Z-PAK) 250 MG tablet 2 today, then one qd x 4 days 12/12/21  Yes Rodriguez-Southworth, Sunday Spillers, PA-C  chlorpheniramine-HYDROcodone (TUSSIONEX PENNKINETIC ER) 10-8 MG/5ML SUER Take 5 mLs by mouth every 12 (twelve) hours as needed for cough. 12/12/21  Yes Rodriguez-Southworth, Sunday Spillers, PA-C  methylPREDNISolone (MEDROL DOSEPAK) 4 MG TBPK tablet Take as directed 12/13/21  Yes Rodriguez-Southworth, Brenda, PA-C  ACCU-CHEK GUIDE test strip TEST BLOOD SUGAR DAILY 01/23/21   Milus Banister C, DO  albuterol (PROVENTIL HFA;VENTOLIN HFA) 108 (90 Base) MCG/ACT inhaler Inhale 3 puffs into the lungs at  bedtime.    [provider]  blood glucose meter kit and supplies Dispense based on patient and insurance preference. Test blood sugar  daily (FOR ICD-10 E10.9, E11.9). 10/29/20   Zenia Resides, MD  cetirizine (ZYRTEC) 10 MG tablet Take 10 mg by mouth at bedtime.    [provider]  ferrous sulfate 325 (65 FE) MG tablet Take 325 mg by mouth at bedtime.    [provider]  fluticasone (FLONASE) 50 MCG/ACT nasal spray Place 2 sprays into both nostrils daily. Patient taking differently: Place 2 sprays into both nostrils daily as needed for allergies. 02/06/18   Mikell, Jeani Sow, MD  metFORMIN (GLUCOPHAGE) 1000 MG tablet Take 1 tablet (1,000 mg total) by mouth 2 (two) times daily with a meal. Patient taking  differently: Take 1,000 mg by mouth 3 (three) times daily. 06/23/21 12/26/21  Ezequiel Essex, MD  montelukast (SINGULAIR) 10 MG tablet Take 10 mg by mouth every evening.     [provider]  naproxen (NAPROSYN) 500 MG tablet Take 1 tablet (500 mg total) by mouth 2 (two) times daily as needed for moderate pain or mild pain. 11/05/21   Zola Button, MD  norgestimate-ethinyl estradiol (ORTHO-CYCLEN) 0.25-35 MG-MCG tablet TAKE 1 TABLET BY MOUTH EVERY DAY Patient taking differently: Take 1 tablet by mouth daily. 10/26/21   Ezequiel Essex, MD  Olopatadine HCl 0.2 % SOLN Place 1 drop into both eyes daily as needed for itching.    [provider]    Family History Family History  Problem Relation Age of Onset   Diabetes Mother    Hypertension Mother    Other Son        Beta Thalassemia trait (minor)   Colon cancer Neg Hx    Esophageal cancer Neg Hx    Rectal cancer Neg Hx    Stomach cancer Neg Hx     Social History Social History   Tobacco Use   Smoking status: Never   Smokeless tobacco: Never  Vaping Use   Vaping Use: Never used  Substance Use Topics   Alcohol use: No   Drug use: No     Allergies   Patient has no known allergies.   Review of Systems Review of Systems  Constitutional:  Positive for chills and fatigue. Negative for diaphoresis.  HENT:  Positive for congestion, nosebleeds, postnasal drip and rhinorrhea. Negative for sore throat and trouble swallowing.   Eyes:  Negative for discharge.  Respiratory:  Positive for choking. Negative for shortness of breath and wheezing.   Musculoskeletal:  Positive for myalgias.  Skin:  Negative for rash.    Physical Exam Triage Vital Signs ED Triage Vitals  Enc Vitals Group     BP 12/12/21 1339 125/76     Pulse Rate 12/12/21 1339 (!) 103     Resp 12/12/21 1339 17     Temp 12/12/21 1339 98.7 F (37.1 C)     Temp Source 12/12/21 1339 Oral     SpO2 12/12/21 1339 98 %     Weight --      Height --      Head  Circumference --      Peak Flow --      Pain Score 12/12/21 1338 6     Pain Loc --      Pain Edu? --      Excl. in [place]? --    No data found.  Updated Vital Signs BP 125/76 (BP Location: Right Arm)  Pulse (!) 103    Temp 98.7 F (37.1 C) (Oral)    Resp 17    LMP 11/04/2021    SpO2 98%   Visual Acuity Right Eye Distance:   Left Eye Distance:   Bilateral Distance:    Right Eye Near:   Left Eye Near:    Bilateral Near:      Physical Exam Vitals signs and nursing note reviewed.  Constitutional:      General: She is not in acute distress.    Appearance: Normal appearance. She is not ill-appearing, toxic-appearing or diaphoretic.  HENT:     Head: Normocephalic.     Right Ear: Tympanic membrane, ear canal and external ear normal.     Left Ear: Tympanic membrane, ear canal and external ear normal.     Nose: Nose normal.     Mouth/Throat:     Mouth: Mucous membranes are moist.  Eyes:     General: No scleral icterus.       Right eye: No discharge.        Left eye: No discharge.     Conjunctiva/sclera: Conjunctivae normal.  Neck:     Musculoskeletal: Neck supple. No neck rigidity.  Cardiovascular:     Rate and Rhythm: Normal rate and regular rhythm.     Heart sounds: No murmur.  Pulmonary: she had several cough attacks while being examined    Effort: Pulmonary effort is normal.     Breath sounds: Normal breath sounds.   Musculoskeletal: Normal range of motion.  Lymphadenopathy:     Cervical: No cervical adenopathy.  Skin:    General: Skin is warm and dry.     Coloration: Skin is not jaundiced.     Findings: No rash.  Neurological:     Mental Status: She is alert and oriented to person, place, and time.     Gait: Gait normal.  Psychiatric:        Mood and Affect: Mood normal.        Behavior: Behavior normal.        Thought Content: Thought content normal.        Judgment: Judgment normal.    UC Treatments / Results  Labs (all labs ordered are listed, but only  abnormal results are displayed) Labs Reviewed - No data to display  EKG   Radiology DG Chest 2 View  Result Date: 12/12/2021 CLINICAL DATA:  Cough, sore throat, headache and hemoptysis, myalgias EXAM: CHEST - 2 VIEW COMPARISON:  10/31/2013 FINDINGS: The heart size and mediastinal contours are within normal limits. Both lungs are clear. The visualized skeletal structures are unremarkable. IMPRESSION: No active cardiopulmonary disease. Electronically Signed   By: Jerilynn Mages.  Shick M.D.   On: 12/12/2021 14:06    Procedures Procedures (including critical care time)  Medications Ordered in UC Medications  predniSONE (DELTASONE) tablet 60 mg (60 mg Oral Given 12/12/21 1353)    Initial Impression / Assessment and Plan / UC Course  I have reviewed the triage vital signs and the nursing notes. Pertinent  imaging results that were available during my care of the patient were reviewed by me and considered in my medical decision making (see chart for details). She was given Prednisone 60 mg PO while awaiting test results and this helped the cough attacks.  Has Acute bronchitis I palced her on Zpack as noted , Medrol dose pack  to start tomorrow, and Tussionex.     Final Clinical Impressions(s) / UC Diagnoses   Final diagnoses:  Acute bronchitis, unspecified organism   Discharge Instructions   None    ED Prescriptions     Medication Sig Dispense Auth. Provider   methylPREDNISolone (MEDROL DOSEPAK) 4 MG TBPK tablet Take as directed 21 tablet Rodriguez-Southworth, Sunday Spillers, PA-C   chlorpheniramine-HYDROcodone (TUSSIONEX PENNKINETIC ER) 10-8 MG/5ML SUER Take 5 mLs by mouth every 12 (twelve) hours as needed for cough. 140 mL Rodriguez-Southworth, Lenzy Kerschner, PA-C   azithromycin (ZITHROMAX Z-PAK) 250 MG tablet 2 today, then one qd x 4 days 6 tablet Rodriguez-Southworth, Sunday Spillers, PA-C      I have reviewed the PDMP during this encounter.   Shelby Mattocks, PA-C 12/12/21 1426

## 2021-12-24 ENCOUNTER — Ambulatory Visit (INDEPENDENT_AMBULATORY_CARE_PROVIDER_SITE_OTHER): Payer: Medicaid Other | Admitting: Advanced Practice Midwife

## 2021-12-24 ENCOUNTER — Other Ambulatory Visit: Payer: Self-pay

## 2021-12-24 VITALS — BP 112/68 | HR 85 | Wt 151.2 lb

## 2021-12-24 DIAGNOSIS — N939 Abnormal uterine and vaginal bleeding, unspecified: Secondary | ICD-10-CM | POA: Diagnosis not present

## 2021-12-24 DIAGNOSIS — D259 Leiomyoma of uterus, unspecified: Secondary | ICD-10-CM

## 2021-12-24 MED ORDER — CETIRIZINE HCL 10 MG PO TABS: 10.0000 mg | ORAL_TABLET | Freq: Every day | ORAL | 0 refills | Status: AC

## 2021-12-24 NOTE — Progress Notes (Signed)
GYNECOLOGY PROBLEM CARE ENCOUNTER NOTE  History:     Teresa [name] is a 41 y.o. [phone] female here for gyn problem visit.  Current complaints: recurrent heavy bleeding and abdominal pain in the setting of uterine fibroids.  Patient delivered via cesarean on 04/2019. She states she stopped breastfeeding 12/03/2020. She experienced new onset abdominal pain and heavy vagina bleeding from Jan through April 2022. She initiated OCPs and Naproxen with her PCP and experienced an improvement in her symptoms. The in August 2022 her symptoms returned. Patient desires a solution which allows her to discontinue her current regimen. Denies abnormal vaginal bleeding, discharge, pelvic pain, problems with intercourse or other gynecologic concerns.    Gynecologic History Patient's last menstrual period was 12/19/2021 (exact date). Contraception: Nexplanon Last Pap: 04/2021. Result was normal with negative HPV   Obstetric History OB History  Gravida Para Term Preterm AB Living  '4 3 3     3  ' SAB IAB Ectopic Multiple Live Births        0 3    # Outcome Date GA Lbr Len/2nd Weight Sex Delivery Anes PTL Lv  4 Gravida           3 Term 06/28/16 [redacted]w[redacted]d 7 lb 6.3 oz (3.355 kg) M CS-LTranv Spinal  LIV  2 Term 04/06/12 [id number] lb 5.5 oz (3.33 kg) M CS-LTranv Spinal  LIV  1 Term 02/06/10 [id number] CS-Unspec   LIV     Birth Comments: Fetal Distress    Past Medical History:  Diagnosis Date   Anemia    Arthritis    Asthma    Ventolin   GERD (gastroesophageal reflux disease)    Gestational diabetes    Irregular menstrual cycle 05/25/2013   Palpitations 03/17/2017   Physical exam 09/11/2020   Suprapubic pain 12/24/2020   Ulcer     Past Surgical History:  Procedure Laterality Date   CESAREAN SECTION     CESAREAN SECTION  04/06/2012   Procedure: CESAREAN SECTION;  Surgeon: LisLahoma CrockerD;  [facility];  Service: Gynecology;  Laterality: N/A;   CESAREAN SECTION N/A 06/28/2016   Procedure:  CESAREAN SECTION;  Surgeon: CarLavonia DraftsD;  [facility];  Laterality: N/A;   CESAREAN SECTION N/A 04/24/2019   Procedure: REPEAT CESAREAN SECTION;  Surgeon: StiTruett MainlandO;  Location: [facility];  Service: Obstetrics;  Laterality: N/A;   SCAR REVISION N/A 04/24/2019   Procedure: SCAR REVISION;  Surgeon: StiTruett MainlandO;  Location: [facility];  Service: Obstetrics;  Laterality: N/A;    Current Outpatient Medications on File Prior to Visit  Medication Sig Dispense Refill   ACCU-CHEK GUIDE test strip TEST BLOOD SUGAR DAILY 100 strip 3   albuterol (PROVENTIL HFA;VENTOLIN HFA) 108 (90 Base) MCG/ACT inhaler Inhale 3 puffs into the lungs at bedtime.     blood glucose meter kit and supplies Dispense based on patient and insurance preference. Test blood sugar  daily (FOR ICD-10 E10.9, E11.9). 1 each 0   ferrous sulfate 325 (65 FE) MG tablet Take 325 mg by mouth at bedtime.     metFORMIN (GLUCOPHAGE) 1000 MG tablet Take 1 tablet (1,000 mg total) by mouth 2 (two) times daily with a meal. (Patient taking differently: Take 1,000 mg by mouth 3 (three) times daily.) 180 tablet 1   montelukast (SINGULAIR) 10 MG tablet Take 10 mg by mouth every evening.      naproxen (  NAPROSYN) 500 MG tablet Take 1 tablet (500 mg total) by mouth 2 (two) times daily as needed for moderate pain or mild pain. 60 tablet 0   azithromycin (ZITHROMAX Z-PAK) 250 MG tablet 2 today, then one qd x 4 days (Patient not taking: Reported on 12/24/2021) 6 tablet 0   chlorpheniramine-HYDROcodone (TUSSIONEX PENNKINETIC ER) 10-8 MG/5ML SUER Take 5 mLs by mouth every 12 (twelve) hours as needed for cough. (Patient not taking: Reported on 12/24/2021) 140 mL 0   fluticasone (FLONASE) 50 MCG/ACT nasal spray Place 2 sprays into both nostrils daily. (Patient not taking: Reported on 12/24/2021) 16 g 6   methylPREDNISolone (MEDROL DOSEPAK) 4 MG TBPK tablet Take as directed (Patient not taking: Reported on  12/24/2021) 21 tablet 0   norgestimate-ethinyl estradiol (ORTHO-CYCLEN) 0.25-35 MG-MCG tablet TAKE 1 TABLET BY MOUTH EVERY DAY (Patient not taking: Reported on 12/24/2021) 84 tablet 1   Olopatadine HCl 0.2 % SOLN Place 1 drop into both eyes daily as needed for itching. (Patient not taking: Reported on 12/24/2021)     No current facility-administered medications on file prior to visit.    No Known Allergies  Social History:  reports that she has never smoked. She has never used smokeless tobacco. She reports that she does not drink alcohol and does not use drugs.  Family History  Problem Relation Age of Onset   Diabetes Mother    Hypertension Mother    Other Son        Beta Thalassemia trait (minor)   Colon cancer Neg Hx    Esophageal cancer Neg Hx    Rectal cancer Neg Hx    Stomach cancer Neg Hx     The following portions of the patient's history were reviewed and updated as appropriate: allergies, current medications, past family history, past medical history, past social history, past surgical history and problem list.  Review of Systems Pertinent items noted in HPI and remainder of comprehensive ROS otherwise negative.  Physical Exam:  BP 112/68    Pulse 85    Wt 151 lb 3.2 oz (68.6 kg)    LMP 12/19/2021 (Exact Date)    Breastfeeding No    BMI 25.16 kg/m  CONSTITUTIONAL: Well-developed, well-nourished female in no acute distress.  HENT:  Normocephalic, atraumatic, External right and left ear normal.  EYES: Conjunctivae and EOM are normal. Pupils are equal, round, and reactive to light. No scleral icterus.  NECK: Normal range of motion, supple, no masses.  Normal thyroid.  SKIN: Skin is warm and dry. No rash noted. Not diaphoretic. No erythema. No pallor. MUSCULOSKELETAL: Normal range of motion. No tenderness.  No cyanosis, clubbing, or edema. NEUROLOGIC: Alert and oriented to person, place, and time. Normal reflexes, muscle tone coordination.  PSYCHIATRIC: Normal mood and affect.  Normal behavior. Normal judgment and thought content. CARDIOVASCULAR: Normal heart rate noted, regular rhythm RESPIRATORY: Clear to auscultation bilaterally. Effort and breath sounds normal, no problems with respiration noted. BREASTS: Symmetric in size.  ABDOMEN: Deferred PELVIC: Deferred   Assessment and Plan:    1. Abnormal uterine bleeding - Given states preference for lowest intervention which improves symptoms, advised patient to continue current course - Discussed with Dr. Rip Harbour, who agrees patient is an ideal candidate for hormonal IUD  2. Uterine leiomyoma, unspecified location - Most recent imaging 11/04/2021 - Radiology report: Hypoechoic fibroid in the right uterine body measuring up to 1.5 cm.Anterior uterine body fibroid measures approximately 4.7 x 3.2 x 3.2  Routine preventative health maintenance measures emphasized.  Please refer to After Visit Summary for other counseling recommendations.      Mallie Snooks, [place], MSN, CNM Certified Nurse Midwife, Product/process development scientist for [employer], [place]

## 2022-01-13 ENCOUNTER — Telehealth: Payer: Self-pay | Admitting: Family Medicine

## 2022-01-13 NOTE — Telephone Encounter (Signed)
Patient called stating she went to ED and they told her she needed to call cancer center and schedule appointment. Patient called the cancer center and they told her she would have to get a referral placed by her PCP. Please advise. Thanks!

## 2022-01-13 NOTE — Telephone Encounter (Signed)
Will forward to MD to advise and follow up with patient.   Will be happy to process the referral once this next step has been done.  Earlene Bjelland,CMA

## 2022-01-14 ENCOUNTER — Telehealth: Payer: Self-pay | Admitting: Family Medicine

## 2022-01-14 ENCOUNTER — Encounter: Payer: Self-pay | Admitting: Family Medicine

## 2022-01-14 NOTE — Telephone Encounter (Signed)
Received note from referral coordinator that patient was inquiring about a referral to the cancer center. Given that her chart review did not reveal any provider concern for malignancy, I called Teresa [name] to clarify.   She reports being sent to the ED from [facility] in November (there was remote concern for ovarian torsion). She recalls the ED provider mentioned needing to go to the cancer center out of concern for pre-cancer. She had not called the cancer center until recently because she didn't think "it was a big deal"; however, continued abnormal uterine bleeding concerned her.   After chart review and discussion, I do not have concern for malignancy at this time. Previous PAP May 2022 was NILM and negative for HPV. She saw OBGYN 12/24/21 and discussed IUD placement; although patient today reports she was told to wait six months until her Nexplanon expired to get that removed and the IUD placed.   Provided reassurance and discussed the recent provider visits with her. She will be traveling [place] this summer May-[place] and will schedule an appointment for when she gets back. She is hesitant to have the IUD placed prior to her trip in case she has problems, she does not think she would be able to get a doctor's appointment overseas to address the issue. She reports a desire to avoid pregnancy, so will take OCPs once her Nexplanon expires in July until she returns to [facility] for IUD placement.   Ezequiel Essex, MD

## 2022-02-23 ENCOUNTER — Other Ambulatory Visit: Payer: Self-pay

## 2022-02-23 MED ORDER — ACCU-CHEK GUIDE VI STRP: ORAL_STRIP | 3 refills | Status: DC

## 2022-03-24 ENCOUNTER — Ambulatory Visit (INDEPENDENT_AMBULATORY_CARE_PROVIDER_SITE_OTHER): Payer: Medicaid Other | Admitting: Family Medicine

## 2022-03-24 VITALS — BP 107/41 | HR 78 | Ht 65.0 in | Wt 148.8 lb

## 2022-03-24 DIAGNOSIS — E119 Type 2 diabetes mellitus without complications: Secondary | ICD-10-CM

## 2022-03-24 DIAGNOSIS — R309 Painful micturition, unspecified: Secondary | ICD-10-CM | POA: Diagnosis present

## 2022-03-24 LAB — POCT URINALYSIS DIP (MANUAL ENTRY)
Bilirubin, UA: NEGATIVE
Blood, UA: NEGATIVE
Glucose, UA: NEGATIVE mg/dL
Ketones, POC UA: NEGATIVE mg/dL
Leukocytes, UA: NEGATIVE
Nitrite, UA: NEGATIVE
Protein Ur, POC: NEGATIVE mg/dL
Spec Grav, UA: >=1.03 — AB (ref 1.010–1.025)
Urobilinogen, UA: 0.2 E.U./dL
pH, UA: 5.5 (ref 5.0–8.0)

## 2022-03-24 LAB — POCT GLYCOSYLATED HEMOGLOBIN (HGB A1C): HbA1c, POC (controlled diabetic range): 6.5 % (ref 0.0–7.0)

## 2022-03-24 NOTE — Patient Instructions (Signed)
It was nice seeing you today! ? ?A1c looks great! ? ?No signs of UTI. ? ?Stay well, ?Zola Button, MD ?[place] ?[phone] ? ?-- ? ?Make sure to check out at the front desk before you leave today. ? ?Please arrive at least 15 minutes prior to your scheduled appointments. ? ?If you had blood work today, I will send you a MyChart message or a letter if results are normal. Otherwise, I will give you a call. ? ?If you had a referral placed, they will call you to set up an appointment. Please give [place] a call if you don't hear back in the next 2 weeks. ? ?If you need additional refills before your next appointment, please call your pharmacy first.  ?

## 2022-03-24 NOTE — Progress Notes (Signed)
? ? ?  SUBJECTIVE:  ? ?CHIEF COMPLAINT / HPI:  ?Chief Complaint  ?Patient presents with  ? Vaginal burning  ? Urination problem  ?  ?Patient reports dysuria for 3 days.  Denies urinary frequency, fever, chills, new back pain.  She does have some vaginal discharge which she believes is normal.  No prior history of UTI. ? ?Patient takes metformin 1000 mg twice daily. ? ?Going to [place] on Monday to see her aunt. ? ?PERTINENT  PMH / PSH: T2DM ? ?Patient Care Team: ?Ezequiel Essex, MD as PCP - General (Family Medicine)  ? ?OBJECTIVE:  ? ?BP (!) 107/41   Pulse 78   Ht '5\' 5"'$  (1.651 m)   Wt 148 lb 12.8 oz (67.5 kg)   LMP 01/10/2022   SpO2 100%   BMI 24.76 kg/m?   ?Physical Exam ?Constitutional:   ?   General: She is not in acute distress. ?HENT:  ?   Head: Normocephalic and atraumatic.  ?Cardiovascular:  ?   Rate and Rhythm: Normal rate and regular rhythm.  ?Pulmonary:  ?   Effort: Pulmonary effort is normal. No respiratory distress.  ?   Breath sounds: Normal breath sounds.  ?Abdominal:  ?   Palpations: Abdomen is soft.  ?   Tenderness: There is no abdominal tenderness. There is no right CVA tenderness or left CVA tenderness.  ?Musculoskeletal:  ?   Cervical back: Neck supple.  ?Skin: ?   General: Skin is warm and dry.  ?Neurological:  ?   Mental Status: She is alert.  ?  ? ? ?  03/24/2022  ?  4:20 PM  ?Depression screen PHQ 2/9  ?Decreased Interest 0  ?Down, Depressed, Hopeless 0  ?PHQ - 2 Score 0  ?Altered sleeping 0  ?Tired, decreased energy 2  ?Change in appetite 2  ?Feeling bad or failure about yourself  0  ?Trouble concentrating 0  ?Moving slowly or fidgety/restless 0  ?Suicidal thoughts 0  ?PHQ-9 Score 4  ?Difficult doing work/chores Not difficult at all  ?  ? ?{Show previous vital signs (optional):23777} ? ?Last hemoglobin A1c ?Lab Results  ?Component Value Date  ? HGBA1C 6.5 03/24/2022  ? ?  ? ?ASSESSMENT/PLAN:  ? ?Dysuria ?UA does not indicate infection.  Offered to test her for yeast infection but patient  declined stating that she was only concerned about UTI. ? ?Controlled type 2 diabetes mellitus without complication, without long-term current use of insulin ([place]) ?Well-controlled on metformin.  A1c improved from prior.  We will continue current medications, anticipate next A1c check in 3 to 6 months. ?  ? ?Return if symptoms worsen or fail to improve.  ? ?Zola Button, MD ?[place]  ?

## 2022-03-24 NOTE — Assessment & Plan Note (Signed)
Well-controlled on metformin.  A1c improved from prior.  We will continue current medications, anticipate next A1c check in 3 to 6 months. ?

## 2022-04-10 ENCOUNTER — Other Ambulatory Visit: Payer: Self-pay | Admitting: Family Medicine

## 2022-04-10 DIAGNOSIS — N920 Excessive and frequent menstruation with regular cycle: Secondary | ICD-10-CM

## 2022-04-16 ENCOUNTER — Other Ambulatory Visit: Payer: Self-pay | Admitting: Family Medicine

## 2022-04-16 DIAGNOSIS — E119 Type 2 diabetes mellitus without complications: Secondary | ICD-10-CM

## 2022-05-25 ENCOUNTER — Encounter: Payer: Self-pay | Admitting: *Deleted

## 2023-04-14 ENCOUNTER — Other Ambulatory Visit: Payer: Self-pay | Admitting: Family Medicine

## 2023-04-14 DIAGNOSIS — E119 Type 2 diabetes mellitus without complications: Secondary | ICD-10-CM

## 2023-05-09 ENCOUNTER — Inpatient Hospital Stay (HOSPITAL_COMMUNITY): Payer: BC Managed Care – PPO

## 2023-05-09 ENCOUNTER — Encounter (HOSPITAL_COMMUNITY): Payer: Self-pay

## 2023-05-09 ENCOUNTER — Inpatient Hospital Stay (HOSPITAL_COMMUNITY)
Admission: AD | Admit: 2023-05-09 | Discharge: 2023-05-09 | Disposition: A | Discharge status: Home / Self Care | Payer: BC Managed Care – PPO | Attending: Obstetrics and Gynecology | Admitting: Obstetrics and Gynecology

## 2023-05-09 DIAGNOSIS — D649 Anemia, unspecified: Secondary | ICD-10-CM

## 2023-05-09 DIAGNOSIS — O209 Hemorrhage in early pregnancy, unspecified: Secondary | ICD-10-CM

## 2023-05-09 DIAGNOSIS — O200 Threatened abortion: Secondary | ICD-10-CM | POA: Insufficient documentation

## 2023-05-09 DIAGNOSIS — O24111 Pre-existing diabetes mellitus, type 2, in pregnancy, first trimester: Secondary | ICD-10-CM | POA: Insufficient documentation

## 2023-05-09 DIAGNOSIS — Z7984 Long term (current) use of oral hypoglycemic drugs: Secondary | ICD-10-CM | POA: Insufficient documentation

## 2023-05-09 DIAGNOSIS — M545 Low back pain, unspecified: Secondary | ICD-10-CM

## 2023-05-09 DIAGNOSIS — O26851 Spotting complicating pregnancy, first trimester: Secondary | ICD-10-CM | POA: Diagnosis not present

## 2023-05-09 DIAGNOSIS — Z3A01 Less than 8 weeks gestation of pregnancy: Secondary | ICD-10-CM | POA: Diagnosis not present

## 2023-05-09 DIAGNOSIS — Z3201 Encounter for pregnancy test, result positive: Secondary | ICD-10-CM | POA: Insufficient documentation

## 2023-05-09 DIAGNOSIS — N939 Abnormal uterine and vaginal bleeding, unspecified: Secondary | ICD-10-CM | POA: Diagnosis not present

## 2023-05-09 LAB — CBC
HCT: 31 % — ABNORMAL LOW (ref 36.0–46.0)
Hemoglobin: 9.2 g/dL — ABNORMAL LOW (ref 12.0–15.0)
MCH: 18.5 pg — ABNORMAL LOW (ref 26.0–34.0)
MCHC: 29.7 g/dL — ABNORMAL LOW (ref 30.0–36.0)
MCV: 62.4 fL — ABNORMAL LOW (ref 80.0–100.0)
Platelets: 303 10*3/uL (ref 150–400)
RBC: 4.97 MIL/uL (ref 3.87–5.11)
RDW: 19.4 % — ABNORMAL HIGH (ref 11.5–15.5)
WBC: 8.2 10*3/uL (ref 4.0–10.5)
nRBC: 0 % (ref 0.0–0.2)

## 2023-05-09 LAB — URINALYSIS, ROUTINE W REFLEX MICROSCOPIC
Bilirubin Urine: NEGATIVE
Glucose, UA: NEGATIVE mg/dL
Ketones, ur: NEGATIVE mg/dL
Nitrite: NEGATIVE
Protein, ur: NEGATIVE mg/dL
Specific Gravity, Urine: 1.009 (ref 1.005–1.030)
pH: 5 (ref 5.0–8.0)

## 2023-05-09 LAB — HCG, QUANTITATIVE, PREGNANCY: hCG, Beta Chain, Quant, S: 618 m[IU]/mL — ABNORMAL HIGH (ref <5)

## 2023-05-09 LAB — WET PREP, GENITAL
Clue Cells Wet Prep HPF POC: NONE SEEN
Sperm: NONE SEEN
Trich, Wet Prep: NONE SEEN
WBC, Wet Prep HPF POC: <10 (ref <10)
Yeast Wet Prep HPF POC: NONE SEEN

## 2023-05-09 LAB — POCT PREGNANCY, URINE: Preg Test, Ur: POSITIVE — AB

## 2023-05-09 LAB — ABO/RH: ABO/RH(D): B POS

## 2023-05-09 NOTE — MAU Provider Note (Cosign Needed Addendum)
History    Chief Complaint  Patient presents with   Vaginal Bleeding   Teresa [name] is a 42 y.o. [id number] presenting for vaginal bleeding and missed menstrual period this month. LMP 03/27/2023, and light spotting for 3 days this month. Today patient noticed bright red blood after wiping, now resolved. No large clots. Has not tested for pregnancy at home. No urinary symptoms, denies vaginal discharge, pruritus, odor.  Patient states this an unintentional, however desired pregnancy.  Additionally had a fall down stairs ~1 week ago where she bruised her back and sacrum. She has been meeting ADL's and ambulating without issue. Last date of IC was Friday.   Hx of diabetes on Metformin. Denies any other daily medication use. History of 4 C/S.   OB History     Gravida  5   Para  3   Term  3   Preterm      AB      Living  3      SAB      IAB      Ectopic      Multiple  0   Live Births  3           Past Medical History:  Diagnosis Date   Anemia    Arthritis    Asthma    Ventolin   Chronic tension headache 02/23/2018   GERD (gastroesophageal reflux disease)    Gestational diabetes    Irregular menstrual cycle 05/25/2013   Palpitations 03/17/2017   Physical exam 09/11/2020   Previous cesarean delivery affecting pregnancy, antepartum 04/06/2012    X 3, will need repeat   Suprapubic pain 12/24/2020   Ulcer    Viral illness 08/11/2021    Past Surgical History:  Procedure Laterality Date   CESAREAN SECTION     CESAREAN SECTION  04/06/2012   Procedure: CESAREAN SECTION;  Surgeon: Antionette Char, MD;  Location: [facility];  Service: Gynecology;  Laterality: N/A;   CESAREAN SECTION N/A 06/28/2016   Procedure: CESAREAN SECTION;  Surgeon: Willodean Rosenthal, MD;  Location: [facility];  Service: Obstetrics;  Laterality: N/A;   CESAREAN SECTION N/A 04/24/2019   Procedure: REPEAT CESAREAN SECTION;  Surgeon: Levie Heritage, DO;  Location: [facility];  Service:  Obstetrics;  Laterality: N/A;   SCAR REVISION N/A 04/24/2019   Procedure: SCAR REVISION;  Surgeon: Levie Heritage, DO;  [facility];  Service: Obstetrics;  Laterality: N/A;    Family History  Problem Relation Age of Onset   Diabetes Mother    Hypertension Mother    Other Son        Beta Thalassemia trait (minor)   Colon cancer Neg Hx    Esophageal cancer Neg Hx    Rectal cancer Neg Hx    Stomach cancer Neg Hx     Social History   Tobacco Use   Smoking status: Never   Smokeless tobacco: Never  Vaping Use   Vaping Use: Never used  Substance Use Topics   Alcohol use: No   Drug use: No    Allergies: No Known Allergies  Medications Prior to Admission  Medication Sig Dispense Refill Last Dose   albuterol (PROVENTIL HFA;VENTOLIN HFA) 108 (90 Base) MCG/ACT inhaler Inhale 3 puffs into the lungs at bedtime.      azithromycin (ZITHROMAX Z-PAK) 250 MG tablet 2 today, then one qd x 4 days (Patient not taking: Reported on 12/24/2021) 6 tablet 0    blood glucose meter  kit and supplies Dispense based on patient and insurance preference. Test blood sugar  daily (FOR ICD-10 E10.9, E11.9). 1 each 0    cetirizine (ZYRTEC) 10 MG tablet Take 1 tablet (10 mg total) by mouth at bedtime. 30 tablet 0    chlorpheniramine-HYDROcodone (TUSSIONEX PENNKINETIC ER) 10-8 MG/5ML SUER Take 5 mLs by mouth every 12 (twelve) hours as needed for cough. (Patient not taking: Reported on 12/24/2021) 140 mL 0    ferrous sulfate 325 (65 FE) MG tablet Take 325 mg by mouth at bedtime.      fluticasone (FLONASE) 50 MCG/ACT nasal spray Place 2 sprays into both nostrils daily. (Patient not taking: Reported on 12/24/2021) 16 g 6    glucose blood (ACCU-CHEK GUIDE) test strip TEST BLOOD SUGAR DAILY 100 strip 3    metFORMIN (GLUCOPHAGE) 1000 MG tablet TAKE 1 TABLET (1,000 MG TOTAL) BY MOUTH TWICE A DAY WITH FOOD 180 tablet 3    methylPREDNISolone (MEDROL DOSEPAK) 4 MG TBPK tablet Take as directed (Patient not taking: Reported  on 12/24/2021) 21 tablet 0    montelukast (SINGULAIR) 10 MG tablet Take 10 mg by mouth every evening.       naproxen (NAPROSYN) 500 MG tablet Take 1 tablet (500 mg total) by mouth 2 (two) times daily as needed for moderate pain or mild pain. 60 tablet 0    norgestimate-ethinyl estradiol (ORTHO-CYCLEN) 0.25-35 MG-MCG tablet TAKE 1 TABLET BY MOUTH EVERY DAY 90 tablet 3    Olopatadine HCl 0.2 % SOLN Place 1 drop into both eyes daily as needed for itching. (Patient not taking: Reported on 12/24/2021)      Per HPI:  Physical Exam Blood pressure (!) 129/56, temperature 98.3 F (36.8 C), temperature source Oral, resp. rate 16, height 5\' 5"  (1.651 m), weight 67.9 kg, last menstrual period 03/27/2023, SpO2 100 %. Physical Exam Constitutional:      General: She is not in acute distress.    Appearance: Normal appearance.  HENT:     Head: Normocephalic and atraumatic.  Eyes:     General: No scleral icterus.    Extraocular Movements: Extraocular movements intact.     Conjunctiva/sclera: Conjunctivae normal.  Cardiovascular:     Rate and Rhythm: Normal rate and regular rhythm.     Heart sounds: Normal heart sounds.  Pulmonary:     Effort: Pulmonary effort is normal.     Breath sounds: Normal breath sounds.  Skin:    General: Skin is warm and dry.  Neurological:     Mental Status: She is alert.   MSK: Tenderness to palpation over right PSIS and bilateral lumbar muscles. Normal ROM with flexion of back. Normal gait.   MAU Course Orders Placed This Encounter  Procedures   Wet prep, genital   US OB LESS THAN 14 WEEKS WITH OB TRANSVAGINAL   Urinalysis, Routine w reflex microscopic -Urine, Clean Catch   CBC   hCG, quantitative, pregnancy   Pregnancy, urine POC   ABO/Rh   Discharge patient Discharge disposition: 01-Home or Self Care; Discharge patient date: 05/09/2023     MDM  1800 UPT + in MAU. Suspected 1st trimester bleeding. Ordered CBC, U/S, hCG, ABO, GC/Chlamydia, wet prep, U/A.    1853 Anemia noted (9.7) known iron deficiency anemia, recommend continuing home iron supplementation. Wetprep unremarkable. B POS on ABO. U/S shows probable early IUP with gestational sac, no fetal pole, yolk sac nor cardiac activity yet. Recommend follow-up quantitative B-HCG levels and repeat US in 7-10 days to assess viability.  24 Spoke with patient about U/S results and c/f normal IUP v nonviable pregnancy. Message sent to [facility] to make appt for viability scan. Return precautions provided. Patient expressed understanding and agreement with plan. For low back pain, recommend tylenol and ice and f/u with PCP at scheduled appt tomorrow.  Tiffany Kocher, DO  Family Medicine, PGY-1    I confirm that I have verified and agree with the information documented in the resident's note and made any necessary editorial changes.    - Recommendation to follow up in 14 days for viability [place].  - Message sent to [facility] to get patient scheduled.  - Threatened miscarriage precautions provided.  - Patient discharged home in stable condition and may return to MAU as needed.   Carlynn Herald, CNM 05/09/2023 9:40 PM

## 2023-05-09 NOTE — MAU Note (Signed)
...  Teresa [name] is a 42 y.o. at Unknown here in MAU reporting: Lower back pain, sharp intermittent bilateral pelvic pain, and vaginal spotting. She reports last Wednesday she fell down the stairs and since then she has been experiencing intermittent lower back pain that radiates to her left side as well as pelvic pain. She reports these pains are worse at night when she lays down. She reports she was at her daughters appointment at 1530 and she used the restroom and noted light pink vaginal spotting on her tissue. Denies vaginal odors and vaginal itching. Denies urinary sx's. Last IC this past Friday.  LMP: 03/27/2023  Pain score:  5/10 lower back 7/10 pelvis  Lab orders placed from triage:  POCT Preg

## 2023-05-10 ENCOUNTER — Encounter: Payer: BC Managed Care – PPO | Admitting: Family Medicine

## 2023-05-10 DIAGNOSIS — Z1159 Encounter for screening for other viral diseases: Secondary | ICD-10-CM

## 2023-05-10 DIAGNOSIS — O0991 Supervision of high risk pregnancy, unspecified, first trimester: Secondary | ICD-10-CM

## 2023-05-10 DIAGNOSIS — E119 Type 2 diabetes mellitus without complications: Secondary | ICD-10-CM

## 2023-05-10 LAB — GC/CHLAMYDIA PROBE AMP (~~LOC~~) NOT AT ARMC
Chlamydia: NEGATIVE
Comment: NEGATIVE
Comment: NORMAL
Neisseria Gonorrhea: NEGATIVE

## 2023-05-12 ENCOUNTER — Other Ambulatory Visit: Payer: Self-pay

## 2023-05-12 ENCOUNTER — Inpatient Hospital Stay (HOSPITAL_COMMUNITY)
Admission: AD | Admit: 2023-05-12 | Discharge: 2023-05-12 | Disposition: A | Discharge status: Home / Self Care | Payer: BC Managed Care – PPO | Attending: Obstetrics & Gynecology | Admitting: Obstetrics & Gynecology

## 2023-05-12 ENCOUNTER — Encounter (HOSPITAL_COMMUNITY): Payer: Self-pay | Admitting: Obstetrics & Gynecology

## 2023-05-12 ENCOUNTER — Inpatient Hospital Stay (HOSPITAL_COMMUNITY): Payer: BC Managed Care – PPO

## 2023-05-12 DIAGNOSIS — O26891 Other specified pregnancy related conditions, first trimester: Secondary | ICD-10-CM | POA: Insufficient documentation

## 2023-05-12 DIAGNOSIS — O99011 Anemia complicating pregnancy, first trimester: Secondary | ICD-10-CM | POA: Diagnosis not present

## 2023-05-12 DIAGNOSIS — Z3A01 Less than 8 weeks gestation of pregnancy: Secondary | ICD-10-CM

## 2023-05-12 DIAGNOSIS — O039 Complete or unspecified spontaneous abortion without complication: Secondary | ICD-10-CM

## 2023-05-12 DIAGNOSIS — R1031 Right lower quadrant pain: Secondary | ICD-10-CM | POA: Insufficient documentation

## 2023-05-12 DIAGNOSIS — D649 Anemia, unspecified: Secondary | ICD-10-CM

## 2023-05-12 DIAGNOSIS — Z3A00 Weeks of gestation of pregnancy not specified: Secondary | ICD-10-CM | POA: Diagnosis not present

## 2023-05-12 DIAGNOSIS — O209 Hemorrhage in early pregnancy, unspecified: Secondary | ICD-10-CM | POA: Diagnosis not present

## 2023-05-12 LAB — CBC
HCT: 28.8 % — ABNORMAL LOW (ref 36.0–46.0)
Hemoglobin: 8.7 g/dL — ABNORMAL LOW (ref 12.0–15.0)
MCH: 18.7 pg — ABNORMAL LOW (ref 26.0–34.0)
MCHC: 30.2 g/dL (ref 30.0–36.0)
MCV: 61.9 fL — ABNORMAL LOW (ref 80.0–100.0)
Platelets: 314 10*3/uL (ref 150–400)
RBC: 4.65 MIL/uL (ref 3.87–5.11)
RDW: 19.5 % — ABNORMAL HIGH (ref 11.5–15.5)
WBC: 7.8 10*3/uL (ref 4.0–10.5)
nRBC: 0 % (ref 0.0–0.2)

## 2023-05-12 LAB — HCG, QUANTITATIVE, PREGNANCY: hCG, Beta Chain, Quant, S: 150 m[IU]/mL — ABNORMAL HIGH (ref <5)

## 2023-05-12 MED ORDER — HYDROMORPHONE HCL 1 MG/ML IJ SOLN
1.0000 mg | Freq: Once | INTRAMUSCULAR | Status: AC
  Administered 2023-05-12: 1 mg via INTRAMUSCULAR
  Filled 2023-05-12: qty 1

## 2023-05-12 MED ORDER — TRAMADOL HCL 50 MG PO TABS: 50.0000 mg | ORAL_TABLET | Freq: Four times a day (QID) | ORAL | 0 refills | Status: DC | PRN

## 2023-05-12 NOTE — MAU Provider Note (Signed)
Chief Complaint: Abdominal Pain and Vaginal Bleeding   Event Date/Time   First Provider Initiated Contact with Patient 05/12/23 1244      SUBJECTIVE HPI: Teresa [name] is a 42 y.o. [id number] at [redacted]w[redacted]d by LMP with confirmed IUP by [place] on 05/09/23 who presents to maternity admissions reporting heavier bleeding that when she presented on 5/20 and onset of severe cramping, mostly in the RLQ.  She is soaking a pad every hour. She is taking ibuprofen with no relief in pain.   HPI  Past Medical History:  Diagnosis Date   Anemia    Arthritis    Asthma    Ventolin   Chronic tension headache 02/23/2018   GERD (gastroesophageal reflux disease)    Gestational diabetes    Irregular menstrual cycle 05/25/2013   Palpitations 03/17/2017   Physical exam 09/11/2020   Previous cesarean delivery affecting pregnancy, antepartum 04/06/2012    X 3, will need repeat   Suprapubic pain 12/24/2020   Ulcer    Viral illness 08/11/2021   Past Surgical History:  Procedure Laterality Date   CESAREAN SECTION     CESAREAN SECTION  04/06/2012   Procedure: CESAREAN SECTION;  Surgeon: Antionette Char, MD;  Location: [facility];  Service: Gynecology;  Laterality: N/A;   CESAREAN SECTION N/A 06/28/2016   Procedure: CESAREAN SECTION;  Surgeon: Willodean Rosenthal, MD;  Location: [facility] BIRTHING SUIT[facility];  Service: Obstetrics;  Laterality: N/A;   CESAREAN SECTION N/A 04/24/2019   Procedure: REPEAT CESAREAN SECTION;  Surgeon: Levie Heritage, DO;  Location: [facility] LD ORS;  Service: Obstetrics;  Laterality: N/A;   SCAR REVISION N/A 04/24/2019   Procedure: SCAR REVISION;  Surgeon: Levie Heritage, DO;  Location: [facility] LD ORS;  Service: Obstetrics;  Laterality: N/A;   Social History   Socioeconomic History   Marital status: Married    Spouse name: Not on file   Number of children: 2   Years of education: Not on file   Highest education level: Not on file  Occupational History   Occupation: housewife  Tobacco Use   Smoking status: Never    Smokeless tobacco: Never  Vaping Use   Vaping Use: Never used  Substance and Sexual Activity   Alcohol use: No   Drug use: No   Sexual activity: Yes    Partners: Male    Birth control/protection: None  Other Topics Concern   Not on file  Social History Narrative   Not on file   Social Determinants of Health   Financial Resource Strain: Low Risk  (04/12/2019)   Overall Financial Resource Strain (CARDIA)    Difficulty of Paying Living Expenses: Not hard at all  Food Insecurity: No Food Insecurity (04/12/2019)   Hunger Vital Sign    Worried About Running Out of Food in the Last Year: Never true    Ran Out of Food in the Last Year: Never true  Transportation Needs: Unknown (04/12/2019)   PRAPARE - Administrator, Civil Service (Medical): No    Lack of Transportation (Non-Medical): Not on file  Physical Activity: Not on file  Stress: Not on file  Social Connections: Not on file  Intimate Partner Violence: Not At Risk (04/12/2019)   Humiliation, Afraid, Rape, and Kick questionnaire    Fear of Current or Ex-Partner: No    Emotionally Abused: No    Physically Abused: No    Sexually Abused: No   No current facility-administered medications on file prior to encounter.   Current Outpatient  Medications on File Prior to Encounter  Medication Sig Dispense Refill   ferrous sulfate 325 (65 FE) MG tablet Take 325 mg by mouth at bedtime.     metFORMIN (GLUCOPHAGE) 1000 MG tablet TAKE 1 TABLET (1,000 MG TOTAL) BY MOUTH TWICE A DAY WITH FOOD 180 tablet 3   montelukast (SINGULAIR) 10 MG tablet Take 10 mg by mouth every evening.      albuterol (PROVENTIL HFA;VENTOLIN HFA) 108 (90 Base) MCG/ACT inhaler Inhale 3 puffs into the lungs at bedtime.     azithromycin (ZITHROMAX Z-PAK) 250 MG tablet 2 today, then one qd x 4 days (Patient not taking: Reported on 12/24/2021) 6 tablet 0   blood glucose meter kit and supplies Dispense based on patient and insurance preference. Test blood sugar   daily (FOR ICD-10 E10.9, E11.9). 1 each 0   cetirizine (ZYRTEC) 10 MG tablet Take 1 tablet (10 mg total) by mouth at bedtime. 30 tablet 0   chlorpheniramine-HYDROcodone (TUSSIONEX PENNKINETIC ER) 10-8 MG/5ML SUER Take 5 mLs by mouth every 12 (twelve) hours as needed for cough. (Patient not taking: Reported on 12/24/2021) 140 mL 0   fluticasone (FLONASE) 50 MCG/ACT nasal spray Place 2 sprays into both nostrils daily. (Patient not taking: Reported on 12/24/2021) 16 g 6   glucose blood (ACCU-CHEK GUIDE) test strip TEST BLOOD SUGAR DAILY 100 strip 3   norgestimate-ethinyl estradiol (ORTHO-CYCLEN) 0.25-35 MG-MCG tablet TAKE 1 TABLET BY MOUTH EVERY DAY (Patient not taking: Reported on 05/12/2023) 90 tablet 3   Olopatadine HCl 0.2 % SOLN Place 1 drop into both eyes daily as needed for itching. (Patient not taking: Reported on 12/24/2021)     No Known Allergies  ROS:  Review of Systems  Constitutional:  Negative for chills, fatigue and fever.  Respiratory:  Negative for shortness of breath.   Cardiovascular:  Negative for chest pain.  Gastrointestinal:  Positive for abdominal pain and vomiting. Negative for nausea.  Genitourinary:  Positive for pelvic pain and vaginal bleeding. Negative for difficulty urinating, dysuria, flank pain, vaginal discharge and vaginal pain.  Neurological:  Negative for dizziness and headaches.  Psychiatric/Behavioral: Negative.       I have reviewed patient's Past Medical Hx, Surgical Hx, Family Hx, Social Hx, medications and allergies.   Physical Exam  Patient Vitals for the past 24 hrs:  BP Temp Temp src Pulse Resp SpO2 Height Weight  05/12/23 1221 (!) 125/58 98 F (36.7 C) Oral 87 18 100 % -- --  05/12/23 1215 -- -- -- -- -- -- 5\' 5"  (1.651 m) 68 kg   Constitutional: Well-developed, well-nourished female in no acute distress.  Cardiovascular: normal rate Respiratory: normal effort GI: Abd soft, non-tender. Pos BS x 4 MS: Extremities nontender, no edema, normal  ROM Neurologic: Alert and oriented x 4.  GU: Neg CVAT.  PELVIC EXAM: Cervix pink, visually ~ 1 cm, moderate amount of bleeding, 2-3 fox swabs used to visualize cervix, vaginal walls and external genitalia normal  LAB RESULTS Results for orders placed or performed during the hospital encounter of 05/12/23 (from the past 24 hour(s))  CBC     Status: Abnormal   Collection Time: 05/12/23 12:53 PM  Result Value Ref Range   WBC 7.8 4.0 - 10.5 K/uL   RBC 4.65 3.87 - 5.11 MIL/uL   Hemoglobin 8.7 (L) 12.0 - 15.0 g/dL   HCT 16.1 (L) 09.6 - 04.5 %   MCV 61.9 (L) 80.0 - 100.0 fL   MCH 18.7 (L) 26.0 - 34.0 pg  MCHC 30.2 30.0 - 36.0 g/dL   RDW 40.9 (H) 81.1 - 91.4 %   Platelets 314 150 - 400 K/uL   nRBC 0.0 0.0 - 0.2 %  hCG, quantitative, pregnancy     Status: Abnormal   Collection Time: 05/12/23 12:53 PM  Result Value Ref Range   hCG, Beta Chain, Quant, S 150 (H) <5 mIU/mL    --/--/B POS Performed at [facility], [address] [place], [place] [address]  [id number]/20 [id number])  IMAGING US OB Transvaginal  Result Date: 05/12/2023 CLINICAL DATA:  Vaginal bleeding, beta HCG 150 EXAM: TRANSVAGINAL OB ULTRASOUND TECHNIQUE: Transvaginal ultrasound was performed for complete evaluation of the gestation as well as the maternal uterus, adnexal regions, and pelvic cul-de-sac. COMPARISON:  05/09/2023 FINDINGS: Intrauterine gestational sac: None Yolk sac:  Not Visualized. Embryo:  Not Visualized. Maternal uterus/adnexae: The uterus is anteverted. Heterogeneous thickened endometrium is identified measuring up to 18 mm. Uterine fibroid seen previously is not identified. The presumed corpus luteal cyst seen within the left ovary on prior study is again identified, unchanged measuring approximately 15 mm. No pelvic free fluid. IMPRESSION: 1. No evidence of intrauterine pregnancy on this exam. The presumed early gestational sac seen previously is no longer identified. Findings are suspicious for failed  pregnancy. Recommend follow-up US in 10-14 days for definitive diagnosis. This recommendation follows SRU consensus guidelines: Diagnostic Criteria for Nonviable Pregnancy Early in the First Trimester. Malva Limes Med 2013; 621:3086-57. 2. Probable residual corpus luteum cyst left adnexa. Electronically Signed   By: Sharlet Salina M.D.   On: 05/12/2023 14:53   US OB LESS THAN 14 WEEKS WITH OB TRANSVAGINAL  Result Date: 05/09/2023 CLINICAL DATA:  Vaginal spotting. EXAM: OBSTETRIC <14 WK [place] AND TRANSVAGINAL OB US TECHNIQUE: Both transabdominal and transvaginal ultrasound examinations were performed for complete evaluation of the gestation as well as the maternal uterus, adnexal regions, and pelvic cul-de-sac. Transvaginal technique was performed to assess early pregnancy. COMPARISON:  November 04, 2021. FINDINGS: Intrauterine gestational sac: Single Yolk sac:  Not Visualized. Embryo:  Not Visualized. Cardiac Activity: Not Visualized. MSD: 3.8 mm   5 w   1 d Subchorionic hemorrhage:  None visualized. Maternal uterus/adnexae: Probable corpus luteum cyst seen in left ovary. Right ovary is unremarkable. No definite free fluid is noted. Probable uterine fibroid is noted. IMPRESSION: Probable early intrauterine gestational sac, but no yolk sac, fetal pole, or cardiac activity yet visualized. Recommend follow-up quantitative B-HCG levels and follow-up US in 14 days to assess viability. This recommendation follows SRU consensus guidelines: Diagnostic Criteria for Nonviable Pregnancy Early in the First Trimester. Malva Limes Med 2013; 846:9629-52. Electronically Signed   By: Lupita Raider M.D.   On: 05/09/2023 18:48    MAU Management/MDM: Orders Placed This Encounter  Procedures   US OB Transvaginal   CBC   hCG, quantitative, pregnancy   Discharge patient    Meds ordered this encounter  Medications   HYDROmorphone (DILAUDID) injection 1 mg   traMADol (ULTRAM) 50 MG tablet    Sig: Take 1-2 tablets (50-100 mg  total) by mouth every 6 (six) hours as needed.    Dispense:  10 tablet    Refill:  0    Order Specific Question:   Supervising Provider    Answer:   Myna Hidalgo [[id number]]    [place] indicates previously seen gestational sac is no longer visualized, indicating SAB.  Reviewed dx with pt and her husband and questions answered.  Dilaudid 1  mg IM given in MAU with significant reduction in pain.  Pt reports less bleeding after ultrasound. VS stable.  Hgb 8.7, but stable from prior labs and pt taking oral iron.  Precautions/reasons to return reviewed.  D/C home, f/u in office in 2 weeks,  message sent to [facility].    ASSESSMENT 1. SAB (spontaneous abortion)   2. [redacted] weeks gestation of pregnancy   3. Anemia, unspecified type     PLAN Discharge home Allergies as of 05/12/2023   No Known Allergies      Medication List     STOP taking these medications    azithromycin 250 MG tablet Commonly known as: Zithromax Z-Pak   chlorpheniramine-HYDROcodone 10-8 MG/5ML Suer Commonly known as: Tussionex Pennkinetic ER   fluticasone 50 MCG/ACT nasal spray Commonly known as: FLONASE   norgestimate-ethinyl estradiol 0.25-35 MG-MCG tablet Commonly known as: ORTHO-CYCLEN   Olopatadine HCl 0.2 % Soln       TAKE these medications    Accu-Chek Guide test strip Generic drug: glucose blood TEST BLOOD SUGAR DAILY   albuterol 108 (90 Base) MCG/ACT inhaler Commonly known as: VENTOLIN HFA Inhale 3 puffs into the lungs at bedtime.   blood glucose meter kit and supplies Dispense based on patient and insurance preference. Test blood sugar  daily (FOR ICD-10 E10.9, E11.9).   cetirizine 10 MG tablet Commonly known as: ZYRTEC Take 1 tablet (10 mg total) by mouth at bedtime.   ferrous sulfate 325 (65 FE) MG tablet Take 325 mg by mouth at bedtime.   metFORMIN 1000 MG tablet Commonly known as: GLUCOPHAGE TAKE 1 TABLET (1,000 MG TOTAL) BY MOUTH TWICE A DAY WITH FOOD   montelukast 10 MG  tablet Commonly known as: SINGULAIR Take 10 mg by mouth every evening.   traMADol 50 MG tablet Commonly known as: ULTRAM Take 1-2 tablets (50-100 mg total) by mouth every 6 (six) hours as needed.        Follow-up Information     [facility] Follow up in 2 week(s).   Why: The clinic will call you with appointment. Contact information: [address] [place] [address] [phone]                Sharen Counter Certified Nurse-Midwife 05/12/2023  3:24 PM

## 2023-05-12 NOTE — MAU Note (Signed)
Teresa [name] is a 42 y.o. at [redacted]w[redacted]d here in MAU reporting: she's having VB and abdominal pain.  Reports she's changing pads hourly and VB began getting heavy on Tuesday.  States too Ibuprofen for pain les than 30 minutes ago LMP: NA Onset of complaint: Tuesday Pain score: 9 Vitals:   05/12/23 1221  BP: (!) 125/58  Pulse: 87  Resp: 18  Temp: 98 F (36.7 C)  SpO2: 100%     FHT:NA Lab orders placed from triage:   None

## 2023-05-31 ENCOUNTER — Other Ambulatory Visit: Payer: BC Managed Care – PPO

## 2023-07-01 ENCOUNTER — Encounter: Payer: Self-pay | Admitting: Family Medicine

## 2023-07-01 ENCOUNTER — Ambulatory Visit (INDEPENDENT_AMBULATORY_CARE_PROVIDER_SITE_OTHER): Payer: BC Managed Care – PPO | Admitting: Family Medicine

## 2023-07-01 ENCOUNTER — Other Ambulatory Visit: Payer: Self-pay

## 2023-07-01 VITALS — BP 116/64 | HR 85 | Ht 65.0 in | Wt 151.8 lb

## 2023-07-01 DIAGNOSIS — M549 Dorsalgia, unspecified: Secondary | ICD-10-CM | POA: Insufficient documentation

## 2023-07-01 DIAGNOSIS — E119 Type 2 diabetes mellitus without complications: Secondary | ICD-10-CM

## 2023-07-01 DIAGNOSIS — G8929 Other chronic pain: Secondary | ICD-10-CM

## 2023-07-01 DIAGNOSIS — M545 Low back pain, unspecified: Secondary | ICD-10-CM | POA: Diagnosis not present

## 2023-07-01 DIAGNOSIS — D500 Iron deficiency anemia secondary to blood loss (chronic): Secondary | ICD-10-CM

## 2023-07-01 DIAGNOSIS — O039 Complete or unspecified spontaneous abortion without complication: Secondary | ICD-10-CM | POA: Insufficient documentation

## 2023-07-01 DIAGNOSIS — Z30018 Encounter for initial prescription of other contraceptives: Secondary | ICD-10-CM

## 2023-07-01 DIAGNOSIS — Z1231 Encounter for screening mammogram for malignant neoplasm of breast: Secondary | ICD-10-CM

## 2023-07-01 LAB — POCT URINE PREGNANCY: Preg Test, Ur: NEGATIVE

## 2023-07-01 LAB — POCT GLYCOSYLATED HEMOGLOBIN (HGB A1C): HbA1c, POC (controlled diabetic range): 6 % (ref 0.0–7.0)

## 2023-07-01 MED ORDER — ACCU-CHEK GUIDE VI STRP
ORAL_STRIP | 3 refills | Status: AC
Start: 2023-07-01 — End: ?

## 2023-07-01 MED ORDER — LIDOCAINE 5 % EX PTCH: 1.0000 | MEDICATED_PATCH | CUTANEOUS | 0 refills | Status: DC

## 2023-07-01 MED ORDER — NORGESTIMATE-ETH ESTRADIOL 0.25-35 MG-MCG PO TABS: 1.0000 | ORAL_TABLET | Freq: Every day | ORAL | 11 refills | Status: DC

## 2023-07-01 NOTE — Progress Notes (Signed)
SUBJECTIVE:   CHIEF COMPLAINT / HPI:  Teresa [name] is a 42 y.o. female with a pertinent past medical history of T2DM, iron deficiency anemia, and miscarriage 1 month ago presenting to the clinic for an annual exam.  Diabetes - Last A1c 6.5 on 03/24/22 - Home CBGs: Daytime <140, morning fasting 170s - Medications: Metformin 1000 mg daily - Adherence: Great - Eye exam: Completed 6 months ago - Foot exam: Due - Microalbumin: Due - Statin: Not taking, lipids checked today - No symptoms of hypoglycemia, polyuria, polydipsia, foot ulcers/trauma - Has occasional hand/foot numbness - Has been exercising a lot recently, feels motivated  Back pain Lower back pain for last year. No serious back injuries. Does carry heavy objects at home and is a Psychologist, sport and exercise at [employer]. Has a shooting pain sometimes going down to right toe from back. Takes 2-4 ibuprofen 200 mg per day with mild relief.  Birth control Sexually active, had a miscarriage 1 month ago. Wants contraception now because she has a trip planned and does not want to get pregnant now. Had Nexplanon, but was told she had a fibroid and Nexplanon hormones were bad for that. Was also on OCPs and stopped because of difficulty taking. Wants something shorter term because she will attempt another pregnancy. Does not think she can stick to minipill.  Does not want shot or anything implanted in her.  PERTINENT PMH / PSH: T2DM, iron deficiency anemia, and miscarriage 1 month ago Works as Lawyer, lifting heavy patients  OBJECTIVE:   BP 116/64   Pulse 85   Ht 5\' 5"  (1.651 m)   Wt 151 lb 12.8 oz (68.9 kg)   LMP 03/27/2023 (Exact Date)   SpO2 100%   BMI 25.26 kg/m   General: Age-appropriate, resting comfortably in chair, pleasant, alert and at baseline. HEENT: No mucosal pallor. Cardiovascular: Regular rate and rhythm. Normal S1/S2. No murmurs, rubs, or gallops appreciated. 2+ radial pulses. Pulmonary: Clear bilaterally to  ascultation. No increased WOB, no accessory muscle usage. No wheezes, crackles, or rhonchi. Skin: Warm and dry. Extremities: No peripheral edema bilaterally. Diabetic foot exam shows intact sensation and discrimination over all toes and plantar surface of feet.  No ulcers or calluses present. MSK: Moderate tenderness to pressure over right lateral lumbar region.  Able to touch toes without pain.  Negative straight leg raise bilaterally.  POCT Labs HgB A1c     Status: None   Collection Time: 07/01/23  9:01 AM  Result Value Ref Range   HbA1c, POC (controlled diabetic range) 6.0 0.0 - 7.0 %    POCT urine pregnancy     Status: None   Collection Time: 07/01/23 10:10 AM  Result Value Ref Range   Preg Test, Ur Negative Negative    ASSESSMENT/PLAN:   Problem List Items Addressed This Visit       Endocrine   Controlled type 2 diabetes mellitus without complication, without long-term current use of insulin (HCC) - Primary    A1c improved to 6.0 today and patient making important lifestyle improvements with exercise and congratulations were offered. - Continue metformin 1000 mg daily - BMP - Lipids - Microalbumin/creatinine ratio      Relevant Medications   glucose blood (ACCU-CHEK GUIDE) test strip   Other Relevant Orders   HgB A1c (Completed)   Microalbumin/Creatinine Ratio, Urine   Basic Metabolic Panel   Lipid Panel     Other   Iron deficiency anemia due to chronic blood loss    -  Continue ferrous sulfate 325 mg - CBC - Ferritin      Relevant Orders   CBC   Ferritin   Miscarriage    Early pregnancy loss 1 month ago.  Has beta thal FH, no known hematological conditions.  Patient interested in attempting another pregnancy, but only after she takes a trip to [place] with family in the spring and will need contraception until then. - Sprintec OCP prescribed and patient counseled on using condoms for next month until medicine begins working - Referral to high risk OB placed       Relevant Orders   Ambulatory referral to Obstetrics / Gynecology   Back pain    Chronic pain currently localized to R lumbar region.  Straight leg raise negative, making sciatica less likely.  Suspect MSK etiology given patient's occupation and associated back strain. - Counseled patient on proper back mechanics - Supported ibuprofen use with food - Prescribed lidocaine 5% transdermal patch      Relevant Medications   lidocaine (LIDODERM) 5 %   Other Visit Diagnoses     Encounter for initial prescription of other contraceptives       Relevant Medications   norgestimate-ethinyl estradiol (SPRINTEC 28) 0.25-35 MG-MCG tablet   Other Relevant Orders   POCT urine pregnancy (Completed)   Screening mammogram for breast cancer       Relevant Orders   MM 3D SCREENING MAMMOGRAM BILATERAL BREAST       Health Maintenance - STD screening: Not interested in screening at this time - Immunizations: Declines COVID-19 booster - Lipid screening: BMP and lipids ordered - Colonoscopy: Not due - Mammogram: Referral placed, patient will call - Advanced Directive: Not discussed - Handout given on health maintenance topics  Return in about 1 year (around 06/30/2024).  Satrina Magallanes Sharion Dove, MD [facility]

## 2023-07-01 NOTE — Assessment & Plan Note (Addendum)
-   Continue ferrous sulfate 325 mg - CBC - Ferritin

## 2023-07-01 NOTE — Assessment & Plan Note (Addendum)
A1c improved to 6.0 today and patient making important lifestyle improvements with exercise and congratulations were offered. - Continue metformin 1000 mg daily - BMP - Lipids - Microalbumin/creatinine ratio

## 2023-07-01 NOTE — Assessment & Plan Note (Signed)
Chronic pain currently localized to R lumbar region.  Straight leg raise negative, making sciatica less likely.  Suspect MSK etiology given patient's occupation and associated back strain. - Counseled patient on proper back mechanics - Supported ibuprofen use with food - Prescribed lidocaine 5% transdermal patch

## 2023-07-01 NOTE — Patient Instructions (Addendum)
It was great to see you today! Thank you for choosing [facility] for your primary care.  Today we addressed: 1. Controlled type 2 diabetes mellitus without complication, without long-term current use of insulin (HCC) We are checking your kidney function, lipids, and you A1c was great today.  2. Encounter for initial prescription of other contraceptives I have prescribed you Sprintec oral contraceptive pill.  Make sure to use condoms for the first month of taking the pill as it will not be effective.  3. Screening mammogram for breast cancer  I recommend you undergo a mammogram. You can call to schedule an appointment by calling [phone]. Directions [address] [place], [place] [address]  4. Iron deficiency anemia due to chronic blood loss We will check your blood counts today.  Keep taking your iron.  5. Pregnancy I have referred you to obstetrics to talk about getting pregnant again.  6. Low back pain I am sending in a lidocaine patch prescription that you can wear.  You can keep taking ibuprofen like you are, but make sure it is with food.   We are checking some labs today, including CBC, BMP, kidney labs, and iron labs.  You should return to our clinic in 1 year or sooner if needed.  Thank you for coming to see [place] at [facility] and for the opportunity to care for you! Abrar Bilton, MD 07/01/2023, 10:31 AM  ____________________________________________________  Make sure to check out at the front desk before you leave today.  Please arrive at least 15 minutes prior to your scheduled appointments.  If you had blood work today, you will get a MyChart message or a letter if results are normal. Otherwise, you will get a call from [place].  If you had a referral placed, they will call you to set up an appointment. Please give [place] a call if you don't hear back in the next 2 weeks.  If you need additional refills before your next appointment, please call  your pharmacy first.

## 2023-07-01 NOTE — Assessment & Plan Note (Addendum)
Early pregnancy loss 1 month ago.  Has beta thal FH, no known hematological conditions.  Patient interested in attempting another pregnancy, but only after she takes a trip to [place] with family in the spring and will need contraception until then. - Sprintec OCP prescribed and patient counseled on using condoms for next month until medicine begins working - Referral to high risk OB placed

## 2023-07-02 LAB — BASIC METABOLIC PANEL
BUN/Creatinine Ratio: 10 (ref 9–23)
BUN: 8 mg/dL (ref 6–24)
CO2: 21 mmol/L (ref 20–29)
Calcium: 9.4 mg/dL (ref 8.7–10.2)
Chloride: 99 mmol/L (ref 96–106)
Creatinine, Ser: 0.79 mg/dL (ref 0.57–1.00)
Glucose: 167 mg/dL — ABNORMAL HIGH (ref 70–99)
Potassium: 3.9 mmol/L (ref 3.5–5.2)
Sodium: 135 mmol/L (ref 134–144)
eGFR: 97 mL/min/{1.73_m2} (ref >59)

## 2023-07-02 LAB — MICROALBUMIN / CREATININE URINE RATIO
Creatinine, Urine: 102.5 mg/dL
Microalb/Creat Ratio: 4 mg/g creat (ref 0–29)
Microalbumin, Urine: 3.9 ug/mL

## 2023-07-02 LAB — LIPID PANEL
Chol/HDL Ratio: 2.9 ratio (ref 0.0–4.4)
Cholesterol, Total: 164 mg/dL (ref 100–199)
HDL: 57 mg/dL (ref >39)
LDL Chol Calc (NIH): 81 mg/dL (ref 0–99)
Triglycerides: 153 mg/dL — ABNORMAL HIGH (ref 0–149)
VLDL Cholesterol Cal: 26 mg/dL (ref 5–40)

## 2023-07-02 LAB — CBC
Hematocrit: 34.4 % (ref 34.0–46.6)
Hemoglobin: 10.2 g/dL — ABNORMAL LOW (ref 11.1–15.9)
MCH: 19.7 pg — ABNORMAL LOW (ref 26.6–33.0)
MCHC: 29.7 g/dL — ABNORMAL LOW (ref 31.5–35.7)
MCV: 66 fL — ABNORMAL LOW (ref 79–97)
Platelets: 250 10*3/uL (ref 150–450)
RBC: 5.18 x10E6/uL (ref 3.77–5.28)
RDW: 19.7 % — ABNORMAL HIGH (ref 11.7–15.4)
WBC: 7.1 10*3/uL (ref 3.4–10.8)

## 2023-07-02 LAB — FERRITIN: Ferritin: 20 ng/mL (ref 15–150)

## 2023-07-05 ENCOUNTER — Telehealth: Payer: Self-pay | Admitting: Family Medicine

## 2023-07-05 DIAGNOSIS — D500 Iron deficiency anemia secondary to blood loss (chronic): Secondary | ICD-10-CM

## 2023-07-05 DIAGNOSIS — Z832 Family history of diseases of the blood and blood-forming organs and certain disorders involving the immune mechanism: Secondary | ICD-10-CM

## 2023-07-05 NOTE — Telephone Encounter (Signed)
Contacted patient concerning her CBC results, discussed anemia and low MCV in setting of beta thalassemia family history.  Ordered ferritin and hemoglobinopathy labs given that patient would like to get pregnant.

## 2023-07-12 ENCOUNTER — Other Ambulatory Visit: Payer: BC Managed Care – PPO

## 2023-07-12 DIAGNOSIS — D500 Iron deficiency anemia secondary to blood loss (chronic): Secondary | ICD-10-CM

## 2023-07-12 DIAGNOSIS — Z832 Family history of diseases of the blood and blood-forming organs and certain disorders involving the immune mechanism: Secondary | ICD-10-CM

## 2023-07-13 LAB — HGB FRACTIONATION CASCADE

## 2023-07-13 LAB — FERRITIN: Ferritin: 16 ng/mL (ref 15–150)

## 2023-07-14 LAB — HGB FRACTIONATION CASCADE: Hgb F: 0 % (ref 0.0–2.0)

## 2023-09-05 ENCOUNTER — Ambulatory Visit
Admission: RE | Admit: 2023-09-05 | Discharge: 2023-09-05 | Disposition: A | Discharge status: Home / Self Care | Payer: BC Managed Care – PPO | Source: Ambulatory Visit | Attending: Family Medicine | Admitting: Family Medicine

## 2023-09-05 DIAGNOSIS — Z1231 Encounter for screening mammogram for malignant neoplasm of breast: Secondary | ICD-10-CM | POA: Diagnosis not present

## 2023-09-07 ENCOUNTER — Other Ambulatory Visit: Payer: Self-pay | Admitting: Family Medicine

## 2023-09-07 DIAGNOSIS — R928 Other abnormal and inconclusive findings on diagnostic imaging of breast: Secondary | ICD-10-CM

## 2023-10-07 ENCOUNTER — Ambulatory Visit
Admission: RE | Admit: 2023-10-07 | Discharge: 2023-10-07 | Disposition: A | Discharge status: Home / Self Care | Payer: BC Managed Care – PPO | Source: Ambulatory Visit | Attending: Family Medicine | Admitting: Family Medicine

## 2023-10-07 DIAGNOSIS — N6012 Diffuse cystic mastopathy of left breast: Secondary | ICD-10-CM | POA: Diagnosis not present

## 2023-10-07 DIAGNOSIS — R928 Other abnormal and inconclusive findings on diagnostic imaging of breast: Secondary | ICD-10-CM

## 2023-10-11 ENCOUNTER — Ambulatory Visit: Payer: BC Managed Care – PPO

## 2023-10-11 DIAGNOSIS — Z23 Encounter for immunization: Secondary | ICD-10-CM | POA: Diagnosis not present

## 2023-10-11 NOTE — Progress Notes (Signed)
Patient presents to nurse clinic for Flu vaccine.  Vaccine administered without complication.  Copy of vaccine given to patient for her employer.

## 2023-10-27 ENCOUNTER — Ambulatory Visit (INDEPENDENT_AMBULATORY_CARE_PROVIDER_SITE_OTHER): Payer: BC Managed Care – PPO | Admitting: Student

## 2023-10-27 VITALS — BP 120/78 | HR 95 | Ht 65.0 in | Wt 151.4 lb

## 2023-10-27 DIAGNOSIS — R102 Pelvic and perineal pain: Secondary | ICD-10-CM

## 2023-10-27 DIAGNOSIS — M545 Low back pain, unspecified: Secondary | ICD-10-CM

## 2023-10-27 DIAGNOSIS — N939 Abnormal uterine and vaginal bleeding, unspecified: Secondary | ICD-10-CM | POA: Diagnosis not present

## 2023-10-27 DIAGNOSIS — G8929 Other chronic pain: Secondary | ICD-10-CM | POA: Diagnosis not present

## 2023-10-27 LAB — POCT URINE PREGNANCY: Preg Test, Ur: NEGATIVE

## 2023-10-27 MED ORDER — MELOXICAM 15 MG PO TABS
15.0000 mg | ORAL_TABLET | Freq: Every day | ORAL | 0 refills | Status: DC
Start: 2023-10-27 — End: 2023-11-23

## 2023-10-27 NOTE — Patient Instructions (Addendum)
It was great to see you! Thank you for allowing me to participate in your care!   I recommend that you always bring your medications to each appointment as this makes it easy to ensure we are on the correct medications and helps [place] not miss when refills are needed.  Our plans for today:  -Urine pregnancy test was negative, we do not think you are pregnant this time. -Please go to your ultrasound appointment that was scheduled for you -The number for your OB/GYN is: [phone].  Please call and schedule an appointment to discuss your pelvic pain. -I have sent a medication called Aloxi cam to assist with your back pain -I have sent a referral to physical therapy, you will receive a call in the next 2-3 weeks    Lie on your back. Bend your right knee so that your right foot is flat on the floor. Cross your left leg over your right so that your left ankle rests on your right knee. Use your hands to grab hold of your left knee and pull it gently toward the opposite shoulder. You should feel the stretch in your buttocks and hips. Hold for 15 to 30 seconds. Relax, and then repeat with the other leg. Repeat this cycle 2 to 4 times.   Lie on your back in a doorway, with one leg through the open door. Slide your leg up the wall to straighten your knee. You should feel a gentle stretch down the back of your leg. Hold the stretch for at least 1 minute. As the days go by, add a little more time until you can relax and let these muscles stretch for as much as 6 minutes for each leg. Do not arch your back. Do not bend either knee. Keep one heel touching the floor and the other heel touching the wall. Do not point your toes. Repeat with your other leg. Do 2 to 4 times for each leg. If you do not have a place to do this exercise in a doorway, there is another way to do it:  Lie on your back and bend the knee of the leg you want to stretch. Loop a towel under the ball and toes of that foot, and hold  the ends of the towel in your hands. Straighten your knee and slowly pull back on the towel. You should feel a gentle stretch down the back of your leg. It is hard to hold this stretch with a towel for a long time, but hold the stretch for at least 15 to 30 seconds. One minute or more is even better. Repeat with your other leg. Do 2 to 4 times for each leg.   Child's Pose Kneel on the floor with your toes together and your knees hip-width apart. Rest your palms on top of your thighs. On an exhale, lower your torso between your knees. Extend your arms alongside your torso with your palms facing down. Relax your shoulders toward the ground. Rest in the pose for as long as needed.   Take care and seek immediate care sooner if you develop any concerns. Please remember to show up 15 minutes before your scheduled appointment time!  Tiffany Kocher, DO [facility]

## 2023-10-27 NOTE — Progress Notes (Signed)
    SUBJECTIVE:   CHIEF COMPLAINT / HPI:   Chronic low back pain Patient with chronic low back pain.  Acutely worsening over the past few months.  Pain is bilateral.  No fevers, chills, weight loss, radiation, neuropathy.  Did not receive the lidocaine patches after her last visit, as pharmacy does not carry the prescription version of these patches.  Intermittently takes naproxen for pain.  Not particularly active.  Has not had PT for her back in the past.  Pelvic pain Hx of abnormal uterine bleeding Known chronic pelvic pain.  History of miscarriage in May of this year.  History of large fibroid, for which she has seen OB/GYN in the past.  No dysuria, no fevers, no NVD, no vaginal discharge, no abnormal vaginal bleeding.  She has not had her period this month, however periods can be irregular for her.  She feels her stomach is enlarged, however this has been a chronic issue and she states people " always think I am pregnant."  Last ultrasound on 05/12/2023 which confirmed intrauterine demise, also struggled to see uterine fibroid.  However ultrasound in 10/31/2023 identified fibroid and increased endometrial thickness in a premenopausal woman.  No prior ovarian pathology.    OBJECTIVE:   Today's Vitals   10/27/23 1639  BP: 120/78  Pulse: 95  SpO2: 100%  Weight: 151 lb 6 oz (68.7 kg)  PainSc: 0-No pain   LMP 03/27/2023 (Exact Date)    General: NAD, pleasant Cardio: RRR, no MRG. Cap Refill <2s. Respiratory: CTAB, normal wob on RA GI: Abdomen is soft, not tender. Enlarged uterus palpated on exam. BS present Skin: Warm and dry Back: No gross deformity, no swelling.  TTP over paraspinal muscles of lumbar spine, hypertonic paraspinal muscles of lumbar spine.  Full range of motion.  Able to walk on tiptoes and heels of feet.  Normal squat.  Normal sensation. Straight leg negative bilaterally FADIR/FABER negative bilaterally  ASSESSMENT/PLAN:   Assessment & Plan Pelvic pain Chronic,  history of uterine fibroid.  Recent transvaginal ultrasound without significant ovarian pathology.  Given exam finding of enlarged uterus, will obtain imaging.  Urine pregnancy negative today. - Transvaginal ultrasound - Follow-up with gynecology, number provided Chronic bilateral low back pain without sciatica Chronic back pain, exam most consistent with muscle strain.  Pain improved with stretching during exam. - Meloxicam 15 mg daily, counseled on use - OTC lidocaine patches, educated patient on how to use and how to obtain - Referral to physical therapy - Recommend follow-up in 4-6 weeks - If pain fails to improve with physical therapy and treatment above, could consider x-ray imaging and muscle relaxer. - Consider referral to sports medicine   Follow-up recommendations Patient complaining of pruritic rash on bilateral arms, this was mentioned at the end of the visit after AVS was handed to patient and was unable to be addressed today.  Appointment scheduled and access to care for tomorrow morning.  Tiffany Kocher, DO [facility]

## 2023-10-27 NOTE — Assessment & Plan Note (Addendum)
Chronic back pain, exam most consistent with muscle strain.  Pain improved with stretching during exam. - Meloxicam 15 mg daily, counseled on use - OTC lidocaine patches, educated patient on how to use and how to obtain - Referral to physical therapy - Recommend follow-up in 4-6 weeks - If pain fails to improve with physical therapy and treatment above, could consider x-ray imaging and muscle relaxer. - Consider referral to sports medicine

## 2023-10-28 ENCOUNTER — Ambulatory Visit: Payer: Self-pay

## 2023-10-28 ENCOUNTER — Ambulatory Visit: Payer: BC Managed Care – PPO

## 2023-10-28 NOTE — Progress Notes (Deleted)
    SUBJECTIVE:   CHIEF COMPLAINT / HPI:   ***  PERTINENT  PMH / PSH: ***  OBJECTIVE:   LMP 03/27/2023 (Exact Date)   ***  ASSESSMENT/PLAN:   No problem-specific Assessment & Plan notes found for this encounter.     Teresa Martinez, MD [facility]

## 2023-11-02 ENCOUNTER — Ambulatory Visit (HOSPITAL_COMMUNITY)
Admission: RE | Admit: 2023-11-02 | Discharge: 2023-11-02 | Disposition: A | Discharge status: Home / Self Care | Payer: BC Managed Care – PPO | Source: Ambulatory Visit | Attending: Family Medicine | Admitting: Family Medicine

## 2023-11-02 DIAGNOSIS — R102 Pelvic and perineal pain: Secondary | ICD-10-CM | POA: Diagnosis not present

## 2023-11-02 DIAGNOSIS — R9389 Abnormal findings on diagnostic imaging of other specified body structures: Secondary | ICD-10-CM | POA: Diagnosis not present

## 2023-11-02 DIAGNOSIS — N854 Malposition of uterus: Secondary | ICD-10-CM | POA: Diagnosis not present

## 2023-11-02 DIAGNOSIS — D251 Intramural leiomyoma of uterus: Secondary | ICD-10-CM | POA: Diagnosis not present

## 2023-11-07 ENCOUNTER — Telehealth: Payer: Self-pay | Admitting: Student

## 2023-11-07 NOTE — Telephone Encounter (Signed)
Called patient, confirmed identity.  Discussed her most recent ultrasound findings.  Discussed uterine fibroid as well as thickened endometrium (she does have menstrual cycles).  Recommend she follow-up with GYN, for the pelvic pain and abnormal uterine bleeding.  She would prefer medical treatment options, but would like to discuss surgical options.  Patient reports that she spoke with GYN, and will likely have appointment scheduled in January.

## 2023-11-23 ENCOUNTER — Other Ambulatory Visit: Payer: Self-pay | Admitting: Student

## 2023-11-23 DIAGNOSIS — M545 Low back pain, unspecified: Secondary | ICD-10-CM

## 2023-12-20 ENCOUNTER — Other Ambulatory Visit: Payer: Self-pay | Admitting: Family Medicine

## 2023-12-20 DIAGNOSIS — M545 Low back pain, unspecified: Secondary | ICD-10-CM

## 2023-12-23 NOTE — Telephone Encounter (Signed)
 Called patient per Dr. Rexene Alberts request to schedule an appointment. Left generic VM to contact office back to schedule an appointment for medication refills.  Drusilla Kanner, CMA

## 2023-12-27 ENCOUNTER — Telehealth: Payer: Self-pay

## 2023-12-27 NOTE — Telephone Encounter (Signed)
 Called patient to inform her about her that she would need to make an appointment in order to receive a refill of prescription Meloxicam  per Dr. Stoney request. Patient stated that she did not want to make an appointment nor need the medication at this time.  Harlene Reiter, CMA

## 2024-01-04 ENCOUNTER — Ambulatory Visit: Payer: BC Managed Care – PPO | Admitting: Obstetrics and Gynecology

## 2024-01-10 ENCOUNTER — Ambulatory Visit (INDEPENDENT_AMBULATORY_CARE_PROVIDER_SITE_OTHER): Payer: BC Managed Care – PPO | Admitting: Certified Nurse Midwife

## 2024-01-10 ENCOUNTER — Encounter: Payer: Self-pay | Admitting: Certified Nurse Midwife

## 2024-01-10 VITALS — BP 117/65 | HR 77 | Ht 65.0 in | Wt 153.0 lb

## 2024-01-10 DIAGNOSIS — R9389 Abnormal findings on diagnostic imaging of other specified body structures: Secondary | ICD-10-CM

## 2024-01-10 DIAGNOSIS — D259 Leiomyoma of uterus, unspecified: Secondary | ICD-10-CM

## 2024-01-10 DIAGNOSIS — Z1331 Encounter for screening for depression: Secondary | ICD-10-CM

## 2024-01-10 DIAGNOSIS — N939 Abnormal uterine and vaginal bleeding, unspecified: Secondary | ICD-10-CM

## 2024-01-10 DIAGNOSIS — M549 Dorsalgia, unspecified: Secondary | ICD-10-CM | POA: Diagnosis not present

## 2024-01-10 DIAGNOSIS — G8929 Other chronic pain: Secondary | ICD-10-CM

## 2024-01-10 NOTE — Patient Instructions (Signed)
Call for ultrasound a week after your next period [phone]

## 2024-01-10 NOTE — Progress Notes (Signed)
GYNECOLOGY VISIT  Patient name: Teresa [name] MRN [id number]  Date of birth: August 04, 1981 Chief Complaint:   Fibroids   History:  Teresa [name] is a 43 y.o. [id number] being seen today for ongoing back and pelvic pain. Patient was seen last year by her PCP for back and pelvic pain possibly related to her fibroids. She was sent for an ultrasound which showed a thickened endometrium and was advised to return in 6-8wks a week after her menses for a repeat US, of which she did not follow-up with. Patient presents today stating she was told by her PCP that she should be seen by her OB due to her [place] results. Patient reports HMB and cramping associated with her menses. She states previous interventions for Meloxicam did not improve her sx's. She was prescribed BC as well as treatment but did not stay on it.    Past Medical History:  Diagnosis Date   Anemia    Arthritis    Asthma    Ventolin   Chronic tension headache 02/23/2018   GERD (gastroesophageal reflux disease)    Gestational diabetes    Irregular menstrual cycle 05/25/2013   Palpitations 03/17/2017   Physical exam 09/11/2020   Previous cesarean delivery affecting pregnancy, antepartum 04/06/2012    X 3, will need repeat   Suprapubic pain 12/24/2020   Ulcer    Viral illness 08/11/2021    Past Surgical History:  Procedure Laterality Date   CESAREAN SECTION     CESAREAN SECTION  04/06/2012   Procedure: CESAREAN SECTION;  Surgeon: Antionette Char, MD;  Location: [facility];  Service: Gynecology;  Laterality: N/A;   CESAREAN SECTION N/A 06/28/2016   Procedure: CESAREAN SECTION;  Surgeon: Willodean Rosenthal, MD;  Location: [facility] BIRTHING SU[facility];  Service: Obstetrics;  Laterality: N/A;   CESAREAN SECTION N/A 04/24/2019   Procedure: REPEAT CESAREAN SECTION;  Surgeon: Levie Heritage, DO;  Location: [facility] LD [facility]S;  Service: Obstetrics;  Laterality: N/A;   SCAR REVISION N/A 04/24/2019   Procedure: SCAR REVISION;  Surgeon: Levie Heritage, DO;   Location: [facility];  Service: Obstetrics;  Laterality: N/A;    The following portions of the patient's history were reviewed and updated as appropriate: allergies, current medications, past family history, past medical history, past social history, past surgical history and problem list.   Health Maintenance:   Last pap 04/2021. Results were:  NILM . H/O abnormal pap: no Last mammogram: 09/2023. Results were: normal. Family h/o breast cancer: no   Review of Systems:  Pertinent items are noted in HPI. Comprehensive review of systems was otherwise negative.   Objective:  Physical Exam BP 117/65   Pulse 77   Ht 5\' 5"  (1.651 m)   Wt 69.4 kg   LMP 01/02/2024 (Exact Date)   Breastfeeding Unknown   BMI 25.46 kg/m    Physical Exam Vitals reviewed.  Constitutional:      Appearance: Normal appearance.  Cardiovascular:     Rate and Rhythm: Normal rate.  Pulmonary:     Effort: Pulmonary effort is normal.  Skin:    General: Skin is warm and dry.  Neurological:     Mental Status: She is alert and oriented to person, place, and time.  Psychiatric:        Mood and Affect: Mood normal.        Behavior: Behavior normal.     Labs and Imaging No results found.  Assessment & Plan:   1. Chronic bilateral back  pain, unspecified back location - US Pelvis Complete; Future - AMB referral to rehabilitation  2. Uterine leiomyoma, unspecified location (Primary) - CBC - Ferritin - US Pelvis Complete; Future - AMB referral to rehabilitation  3. Abnormal uterine bleeding -Declined BC  -Interested in possibly having another baby, but not actively trying right now  4. Thickened endometrium - US Pelvis Complete; Future. Gave patient the number to services and asked her to call to schedule appt on the last day of her menses   Routine preventative health maintenance measures emphasized.  Darrell Jewel, Student-MidWife

## 2024-01-11 LAB — CBC
Hematocrit: 32.9 % — ABNORMAL LOW (ref 34.0–46.6)
Hemoglobin: 10.3 g/dL — ABNORMAL LOW (ref 11.1–15.9)
MCH: 20.6 pg — ABNORMAL LOW (ref 26.6–33.0)
MCHC: 31.3 g/dL — ABNORMAL LOW (ref 31.5–35.7)
MCV: 66 fL — ABNORMAL LOW (ref 79–97)
Platelets: 316 10*3/uL (ref 150–450)
RBC: 5 x10E6/uL (ref 3.77–5.28)
RDW: 16.1 % — ABNORMAL HIGH (ref 11.7–15.4)
WBC: 5.2 10*3/uL (ref 3.4–10.8)

## 2024-01-11 LAB — FERRITIN: Ferritin: 14 ng/mL — ABNORMAL LOW (ref 15–150)

## 2024-01-13 ENCOUNTER — Ambulatory Visit (HOSPITAL_BASED_OUTPATIENT_CLINIC_OR_DEPARTMENT_OTHER)
Admission: RE | Admit: 2024-01-13 | Discharge: 2024-01-13 | Disposition: A | Discharge status: Home / Self Care | Payer: BC Managed Care – PPO | Source: Ambulatory Visit | Attending: Certified Nurse Midwife | Admitting: Certified Nurse Midwife

## 2024-01-13 DIAGNOSIS — G8929 Other chronic pain: Secondary | ICD-10-CM | POA: Diagnosis not present

## 2024-01-13 DIAGNOSIS — N939 Abnormal uterine and vaginal bleeding, unspecified: Secondary | ICD-10-CM | POA: Diagnosis not present

## 2024-01-13 DIAGNOSIS — N854 Malposition of uterus: Secondary | ICD-10-CM | POA: Diagnosis not present

## 2024-01-13 DIAGNOSIS — M549 Dorsalgia, unspecified: Secondary | ICD-10-CM | POA: Insufficient documentation

## 2024-01-13 DIAGNOSIS — D251 Intramural leiomyoma of uterus: Secondary | ICD-10-CM | POA: Diagnosis not present

## 2024-01-13 DIAGNOSIS — R9389 Abnormal findings on diagnostic imaging of other specified body structures: Secondary | ICD-10-CM | POA: Diagnosis not present

## 2024-01-13 DIAGNOSIS — D259 Leiomyoma of uterus, unspecified: Secondary | ICD-10-CM

## 2024-01-17 ENCOUNTER — Encounter: Payer: Self-pay | Admitting: Certified Nurse Midwife

## 2024-02-22 ENCOUNTER — Ambulatory Visit: Payer: BC Managed Care – PPO | Admitting: Obstetrics and Gynecology

## 2024-02-22 ENCOUNTER — Other Ambulatory Visit: Payer: Self-pay

## 2024-02-22 VITALS — BP 114/73 | HR 93 | Wt 154.5 lb

## 2024-02-22 DIAGNOSIS — R9389 Abnormal findings on diagnostic imaging of other specified body structures: Secondary | ICD-10-CM | POA: Diagnosis not present

## 2024-02-22 NOTE — Patient Instructions (Signed)
 Call on the first day of your [place] to schedule your sonohysterogram to evalute the lining of the uterus

## 2024-02-22 NOTE — Progress Notes (Signed)
 GYNECOLOGY VISIT  Patient name: Chinwe Lope MRN [id number]  Date of birth: 1981/03/03 Chief Complaint:   [place] follow up   History:  Parthena Enez Monahan is a 43 y.o. [id number] being seen today for [place] follow up of thickened endometrium.  Has some irregular periods, typically somewhere between 3-4 weeks and will typically have one period a month. Menses are painful since menarche. Having heavy bleeding; started most recent menses on Monday. Typically has heavy cycle but now it is spotting to light. Menses are typically 7 days in length, took a  pregnancy test this past Saturday and it was negative.   Returns April 5 from international travel to see ill parents  Past Medical History:  Diagnosis Date   Anemia    Arthritis    Asthma    Ventolin   Chronic tension headache 02/23/2018   GERD (gastroesophageal reflux disease)    Gestational diabetes    Irregular menstrual cycle 05/25/2013   Palpitations 03/17/2017   Physical exam 09/11/2020   Previous cesarean delivery affecting pregnancy, antepartum 04/06/2012    X 3, will need repeat   Suprapubic pain 12/24/2020   Ulcer    Viral illness 08/11/2021    Past Surgical History:  Procedure Laterality Date   CESAREAN SECTION     CESAREAN SECTION  04/06/2012   Procedure: CESAREAN SECTION;  Surgeon: Antionette Char, MD;  Location: [facility];  Service: Gynecology;  Laterality: N/A;   CESAREAN SECTION N/A 06/28/2016   Procedure: CESAREAN SECTION;  Surgeon: Willodean Rosenthal, MD;  Location: [facility] BIRTHING SUITES;  Service: Obstetrics;  Laterality: N/A;   CESAREAN SECTION N/A 04/24/2019   Procedure: REPEAT CESAREAN SECTION;  Surgeon: Levie Heritage, DO;  Location: [facility];  Service: Obstetrics;  Laterality: N/A;   SCAR REVISION N/A 04/24/2019   Procedure: SCAR REVISION;  Surgeon: Levie Heritage, DO;  Location: [facility];  Service: Obstetrics;  Laterality: N/A;    The following portions of the patient's history were reviewed and updated as  appropriate: allergies, current medications, past family history, past medical history, past social history, past surgical history and problem list.   Health Maintenance:   Last pap     Component Value Date/Time   DIAGPAP  04/29/2021 1154    - Negative for intraepithelial lesion or malignancy (NILM)   HPVHIGH Negative 04/29/2021 1154   ADEQPAP  04/29/2021 1154    Satisfactory for evaluation; transformation zone component PRESENT.    High Risk HPV: Positive  Adequacy:  Satisfactory for evaluation, transformation zone component PRESENT  Diagnosis:  Atypical squamous cells of undetermined significance (ASC-US)  Last mammogram: 09/2023 BIRADS 2   Review of Systems:  Pertinent items are noted in HPI. Comprehensive review of systems was otherwise negative.   Objective:  Physical Exam BP 114/73   Pulse 93   Wt 154 lb 8 oz (70.1 kg)   LMP 03/27/2023 (Exact Date)   BMI 25.71 kg/m    Physical Exam Vitals and nursing note reviewed.  Constitutional:      Appearance: Normal appearance.  HENT:     Head: Normocephalic and atraumatic.  Pulmonary:     Effort: Pulmonary effort is normal.  Skin:    General: Skin is warm and dry.  Neurological:     General: No focal deficit present.     Mental Status: She is alert.  Psychiatric:        Mood and Affect: Mood normal.        Behavior: Behavior  normal.        Thought Content: Thought content normal.        Judgment: Judgment normal.   PELVIC US (01/13/2024): FINDINGS: The uterus is retroverted in position and measures 9.7 x 5.8 x 7.5 cm. It demonstrates a normal, homogeneous echotexture with a 4.2 cm mid fundal intramural fibroid. The endometrium is thickened measuring 1.8 cm and demonstrates a normal homogeneous echotexture.   The right ovary measures 4.1 x 2.5 x 2.6 cm and demonstrates a normal echotexture with a 2.0 cm simple follicle. There is normal color Doppler flow.   The left ovary measures 3.5 x 2.1 x 2.6 cm and  demonstrates a normal echotexture. There is normal color Doppler flow.   There is no fluid present within the cul-de-sac.   IMPRESSION: 1. Thickened endometrium without sonographic evidence mass lesion. Gynecological consultation and hysteroscopy are recommended for further evaluation.   2.  Intramural 4.2 cm mid fundal uterine fibroid.   Thank you for allowing [place] to assist in the care of this patient.     Electronically Signed   By: Lestine Box M.D.   On: 01/16/2024 20:58    Assessment & Plan:   1. Thickened endometrium (Primary) Noted to have thicker than anticipated endometrium one week following LMP, though endometrium homogeneous without evidence of mass. Discussed sampling vs further imaging, will opt for less invasive approach and proceed with sonohystogram.  - [place] Sonohysterogram; Future  Routine preventative health maintenance measures emphasized.  Lorriane Shire, MD [facility] for [facility], [facility]

## 2024-02-27 ENCOUNTER — Telehealth: Payer: Self-pay | Admitting: *Deleted

## 2024-02-27 ENCOUNTER — Encounter: Payer: Self-pay | Admitting: *Deleted

## 2024-02-27 NOTE — Telephone Encounter (Signed)
 Received a voicemail from [facility] that they received an order for a sonohysterogram and they do not schedule this exam in their office.  States we may call back if questions. I discussed with Dr Briscoe Deutscher and will proceed with having HSG done with [facility]. Per protocol I will call patient and ask her to call us or message [place] with first day of her period, then we send message to office personnel  to schedule HSG.  I called Teresa [name] and left a message I am calling with information from your provider and to call back during office hours or respond to MyChart message I will send. Nancy Fetter

## 2024-02-27 NOTE — Telephone Encounter (Signed)
 Patient responded to Mychart message ok, and thank you. Teresa [name]

## 2024-03-01 ENCOUNTER — Encounter: Payer: Self-pay | Admitting: Family Medicine

## 2024-03-01 DIAGNOSIS — R9389 Abnormal findings on diagnostic imaging of other specified body structures: Secondary | ICD-10-CM | POA: Insufficient documentation

## 2024-03-29 ENCOUNTER — Ambulatory Visit: Admitting: Student

## 2024-03-29 ENCOUNTER — Encounter: Payer: Self-pay | Admitting: Student

## 2024-03-29 VITALS — BP 119/53 | HR 87 | Temp 98.1°F | Ht 65.0 in | Wt 149.8 lb

## 2024-03-29 DIAGNOSIS — Z7984 Long term (current) use of oral hypoglycemic drugs: Secondary | ICD-10-CM

## 2024-03-29 DIAGNOSIS — J069 Acute upper respiratory infection, unspecified: Secondary | ICD-10-CM | POA: Insufficient documentation

## 2024-03-29 DIAGNOSIS — J45909 Unspecified asthma, uncomplicated: Secondary | ICD-10-CM

## 2024-03-29 DIAGNOSIS — E119 Type 2 diabetes mellitus without complications: Secondary | ICD-10-CM | POA: Diagnosis not present

## 2024-03-29 HISTORY — DX: Acute upper respiratory infection, unspecified: J06.9

## 2024-03-29 LAB — POCT GLYCOSYLATED HEMOGLOBIN (HGB A1C): HbA1c, POC (controlled diabetic range): 6.4 % (ref 0.0–7.0)

## 2024-03-29 MED ORDER — FLUTICASONE PROPIONATE 50 MCG/ACT NA SUSP
1.0000 | Freq: Every day | NASAL | 12 refills | Status: AC
Start: 2024-03-29 — End: ?

## 2024-03-29 MED ORDER — BENZONATATE 100 MG PO CAPS
100.0000 mg | ORAL_CAPSULE | Freq: Two times a day (BID) | ORAL | 0 refills | Status: AC | PRN
Start: 2024-03-29 — End: ?

## 2024-03-29 NOTE — Addendum Note (Signed)
 Addended by: Howard Pouch on: 03/29/2024 02:48 PM   Modules accepted: Orders

## 2024-03-29 NOTE — Assessment & Plan Note (Signed)
 Well-controlled with A1c 6.4 on metformin. -Continue metformin 1000 mg twice daily -Referral placed for ophthalmology for DM eye exam

## 2024-03-29 NOTE — Patient Instructions (Addendum)
 It was great to see you! Thank you for allowing me to participate in your care!  I recommend that you always bring your medications to each appointment as this makes it easy to ensure you are on the correct medications and helps [place] not miss when refills are needed.  Our plans for today:  -Your A1c is 6.4 today which is great -See the attached information about upper respiratory infections including supportive care, what you can do at home and when to return or go to the emergency department -tessalon pearls sent to pharmacy for cough, keep them away from children    Take care and seek immediate care sooner if you develop any concerns.   Dr. Erick Alley, DO [facility]

## 2024-03-29 NOTE — Assessment & Plan Note (Signed)
 Symptoms consistent with viral infection.  Low concern for pneumonia at this time given no focal lung sounds.  Antibiotics not indicated.  No wheezing on exam but does have persistent cough.  Given history of asthma, recommend albuterol use.  Reassuringly, SpO2 is normal and she is breathing comfortably on room air. - Use albuterol inhaler as needed for cough and shortness of breath - Prescribe Flonase nasal spray, one spray in each nostril daily for postnasal drip - Recommend hot tea with honey for sore throat. - Provided handout including supportive care, return/ED precautions which were discussed - Prescribe Tessalon Perles for cough suppression

## 2024-03-29 NOTE — Progress Notes (Signed)
    SUBJECTIVE:   CHIEF COMPLAINT / HPI:   The patient, with a history of asthma, presents with a week-long history of cold-like symptoms including coughing, congestion, runny nose, body aches, chills, and a sore throat. The patient reports that the cough is productive and has been ongoing since the onset of the symptoms. The patient also reports shortness of breath, which started today. The patient has been experiencing non bloody diarrhea ~3x/day for the past three days but has been able to drink fluids and urinate normally. The patient has had difficulty eating due to a sore throat but has not experienced nausea. The patient's son has been experiencing similar symptoms for the past four to five days. The patient has tried over-the-counter medications including Tamiflu and Robitussin without relief.  PERTINENT  PMH / PSH: T2DM, Asthma  OBJECTIVE:   BP (!) 119/53   Pulse 87   Temp 98.1 F (36.7 C) (Oral)   Ht 5\' 5"  (1.651 m)   Wt 149 lb 12.8 oz (67.9 kg)   LMP 03/14/2024   SpO2 100%   BMI 24.93 kg/m    General: NAD, pleasant, able to participate in exam HEENT: White sclera, clear conjunctiva, MMM, no erythema or exudate of tonsils or oropharynx Cardiac: RRR, no murmurs. Respiratory: CTAB, normal effort, No wheezes, rales or rhonchi Abdomen: Bowel sounds present, nontender, nondistended, soft Extremities: no edema or cyanosis. Skin: warm and dry, no rashes noted Neuro: alert, no obvious focal deficits Psych: Normal affect and mood  ASSESSMENT/PLAN:   Upper respiratory tract infection Symptoms consistent with viral infection.  Low concern for pneumonia at this time given no focal lung sounds.  Antibiotics not indicated.  No wheezing on exam but does have persistent cough.  Given history of asthma, recommend albuterol use.  Reassuringly, SpO2 is normal and she is breathing comfortably on room air. - Use albuterol inhaler as needed for cough and shortness of breath - Prescribe  Flonase nasal spray, one spray in each nostril daily for postnasal drip - Recommend hot tea with honey for sore throat. - Provided handout including supportive care, return/ED precautions which were discussed - Prescribe Tessalon Perles for cough suppression   Controlled type 2 diabetes mellitus without complication, without long-term current use of insulin (HCC) Well-controlled with A1c 6.4 on metformin. -Continue metformin 1000 mg twice daily -Referral placed for ophthalmology for DM eye exam     Dr. Erick Alley, DO [facility]

## 2024-04-18 ENCOUNTER — Other Ambulatory Visit: Payer: Self-pay | Admitting: Family Medicine

## 2024-04-18 DIAGNOSIS — E119 Type 2 diabetes mellitus without complications: Secondary | ICD-10-CM

## 2024-09-20 ENCOUNTER — Telehealth: Payer: Self-pay | Admitting: Family Medicine

## 2024-09-20 NOTE — Telephone Encounter (Signed)
 Called patient to schedule, no answer LVM to call back.   Thanks!

## 2024-09-20 NOTE — Telephone Encounter (Signed)
-----   Message from [facility] Harlene F sent at 09/19/2024  9:15 AM EDT ----- Regarding: RE: Appt/Labs Please schedule with new PCP ----- Message ----- From: Athena Kristene Favors, CMA Sent: 09/19/2024   9:11 AM EDT To: Harlene BIRCH Fleeger, CMA Subject: Appt/Labs                                      Good morning,   This patient has an open gap for KED. Last appt was 03/2024.   Can patient be scheduled for labs or appt, whichever is most appropriate?

## 2025-01-03 ENCOUNTER — Ambulatory Visit: Payer: Self-pay | Admitting: Family Medicine

## 2025-01-03 ENCOUNTER — Encounter: Payer: Self-pay | Admitting: Family Medicine

## 2025-01-03 VITALS — BP 115/75 | HR 78 | Ht 65.0 in | Wt 153.1 lb

## 2025-01-03 DIAGNOSIS — L309 Dermatitis, unspecified: Secondary | ICD-10-CM

## 2025-01-03 DIAGNOSIS — Z13220 Encounter for screening for lipoid disorders: Secondary | ICD-10-CM

## 2025-01-03 DIAGNOSIS — E119 Type 2 diabetes mellitus without complications: Secondary | ICD-10-CM | POA: Diagnosis not present

## 2025-01-03 LAB — POCT GLYCOSYLATED HEMOGLOBIN (HGB A1C): HbA1c, POC (controlled diabetic range): 6.6 % (ref 0.0–7.0)

## 2025-01-03 MED ORDER — TRIAMCINOLONE ACETONIDE 0.1 % EX OINT: 1.0000 | TOPICAL_OINTMENT | Freq: Two times a day (BID) | CUTANEOUS | 0 refills | Status: AC

## 2025-01-03 MED ORDER — ATORVASTATIN CALCIUM 20 MG PO TABS: 20.0000 mg | ORAL_TABLET | Freq: Every day | ORAL | 3 refills | Status: DC

## 2025-01-03 NOTE — Progress Notes (Signed)
" ° °  SUBJECTIVE:   CHIEF COMPLAINT / HPI:  Teresa [name] is a 44 y.o. female with a pertinent past medical history of T2DM and asthma presenting to the clinic for T2DM follow up.  Type 2 diabetes mellitus - Prior A1c 6.4 in April 2025 - Home CBGs: 140s during the day, 170s in the evening - Medications: Metformin  1000 mg BID - Hypoglycemia plan: Verbalized - Adherence: Excellent - Eye exam: DUE - Foot exam: DUE - Microalbumin: DUE - Statin: Not taking - No symptoms of hypoglycemia, polyuria, polydipsia, numbness of extremities, foot ulcers/trauma  PERTINENT PMH / PSH: Asthma, uncomplicated T2DM  *Remainder reviewed in problem list.   OBJECTIVE:   BP 115/75   Pulse 78   Ht 5' 5 (1.651 m)   Wt 153 lb 2 oz (69.5 kg)   LMP 12/04/2024   SpO2 100%   BMI 25.48 kg/m   General: Age-appropriate, resting comfortably in chair, NAD, alert and at baseline. Cardiovascular: Regular rate and rhythm. Normal S1/S2. No murmurs, rubs, or gallops appreciated. 2+ radial pulses. Pulmonary: Clear bilaterally to ascultation. No wheezes, crackles, or rhonchi. Normal WOB on room air. No accessory muscle use. Abdominal: No tenderness to deep or light palpation. No rebound or guarding. No HSM. Skin: Flexural elbow crease scattered hyperkeratotic and hyperpigmented dry skin with few areas of hypopigmented healing skin on the periphery. Extremities: No peripheral edema bilaterally. Capillary refill <2 seconds.  Diabetic Foot Exam - Simple   Simple Foot Form Diabetic Foot exam was performed with the following findings: Yes 01/03/2025  4:17 PM  Visual Inspection No deformities, no ulcerations, no other skin breakdown bilaterally: Yes Sensation Testing Intact to touch and monofilament testing bilaterally: Yes Pulse Check Posterior Tibialis and Dorsalis pulse intact bilaterally: Yes Comments Diabetic foot exam with monofilament shows intact sensation and discrimination over all toes and plantar  surfaces of feet bilaterally.  No ulcers or calluses present.     Results for orders placed or performed in visit on 01/03/25 (from the past 48 hours)  HgB A1c     Status: None   Collection Time: 01/03/25  3:19 PM  Result Value Ref Range   Hemoglobin A1C     HbA1c POC (<> result, manual entry)     HbA1c, POC (prediabetic range)     HbA1c, POC (controlled diabetic range) 6.6 0.0 - 7.0 %    ASSESSMENT/PLAN:   Assessment & Plan Controlled type 2 diabetes mellitus without complication, without long-term current use of insulin (HCC) A1c well-controlled at 6.6 today. - Continue metformin  1000 mg twice daily - Patient would benefit from statin, however she declines birth control and is of childbearing age and sexually active; defer statin for now and revisit pending lipid panel results - Urine ACR - BMP - Foot exam unremarkable as above Lipid screening - Lipid panel Dermatitis Suspect eczema versus other hyperkeratotic condition. - Trial topical triamcinolone  for 2 weeks - Caution patient not to use steroid for longer than 2 weeks at a time  Follow-up in 6 months to a year or sooner if needed.  Hogan Hoobler Toma, MD [facility] "

## 2025-01-03 NOTE — Patient Instructions (Signed)
 It was great to see you today! Thank you for choosing [facility] for your primary care.  Today we addressed: 1.  Diabetes Your A1c is 6.6 today, which is at our goal.  [place] work!  Please do not take your atorvastatin  at this time, we will consider starting in the future but it is not safe to use if there is a chance of getting pregnant  2. Lipid screening We are checking your cholesterol today  3. Dermatitis Please try the triamcinolone  steroid cream for 2 weeks.  If not improving after 2 weeks, please return.  Also do not use the steroid for longer than 2 weeks at a time as it can thin your skin and make it bleed.  We are checking some labs today, including urine kidney labs, metabolic panel, lipid panel.  You will get a MyChart message or a letter if results are normal. Otherwise, you will get a call from us .  You should return to our clinic in 6 months to a year.  Thank you for coming to see us  at [facility] and for the opportunity to care for you! Jeslie Lowe, MD 01/03/2025, 5:18 PM

## 2025-01-03 NOTE — Assessment & Plan Note (Signed)
 A1c well-controlled at 6.6 today. - Continue metformin  1000 mg twice daily - Patient would benefit from statin, however she declines birth control and is of childbearing age and sexually active; defer statin for now and revisit pending lipid panel results - Urine ACR - BMP - Foot exam unremarkable as above

## 2025-01-04 ENCOUNTER — Ambulatory Visit: Payer: Self-pay | Admitting: Family Medicine

## 2025-01-04 LAB — BASIC METABOLIC PANEL WITH GFR
BUN/Creatinine Ratio: 10 (ref 9–23)
BUN: 8 mg/dL (ref 6–24)
CO2: 21 mmol/L (ref 20–29)
Calcium: 9.6 mg/dL (ref 8.7–10.2)
Chloride: 100 mmol/L (ref 96–106)
Creatinine, Ser: 0.79 mg/dL (ref 0.57–1.00)
Glucose: 94 mg/dL (ref 70–99)
Potassium: 4.1 mmol/L (ref 3.5–5.2)
Sodium: 137 mmol/L (ref 134–144)
eGFR: 96 mL/min/1.73

## 2025-01-04 LAB — LIPID PANEL
Chol/HDL Ratio: 3 ratio (ref 0.0–4.4)
Cholesterol, Total: 178 mg/dL (ref 100–199)
HDL: 59 mg/dL
LDL Chol Calc (NIH): 107 mg/dL — ABNORMAL HIGH (ref 0–99)
Triglycerides: 64 mg/dL (ref 0–149)
VLDL Cholesterol Cal: 12 mg/dL (ref 5–40)

## 2025-01-04 LAB — MICROALBUMIN / CREATININE URINE RATIO
Creatinine, Urine: 224.1 mg/dL
Microalb/Creat Ratio: 4 mg/g{creat} (ref 0–29)
Microalbumin, Urine: 8.8 ug/mL
